# Patient Record
Sex: Female | Born: 1960
Health system: Southern US, Community
[De-identification: ages and names within clinical notes are randomized; demographics above are authoritative.]

## PROBLEM LIST (undated history)

## (undated) DIAGNOSIS — I10 Essential (primary) hypertension: Secondary | ICD-10-CM

## (undated) DIAGNOSIS — F32A Depression, unspecified: Secondary | ICD-10-CM

## (undated) DIAGNOSIS — M1990 Unspecified osteoarthritis, unspecified site: Secondary | ICD-10-CM

## (undated) DIAGNOSIS — N189 Chronic kidney disease, unspecified: Secondary | ICD-10-CM

## (undated) DIAGNOSIS — E079 Disorder of thyroid, unspecified: Secondary | ICD-10-CM

## (undated) DIAGNOSIS — H269 Unspecified cataract: Secondary | ICD-10-CM

## (undated) DIAGNOSIS — R112 Nausea with vomiting, unspecified: Secondary | ICD-10-CM

## (undated) DIAGNOSIS — Z9889 Other specified postprocedural states: Secondary | ICD-10-CM

## (undated) HISTORY — PX: TONSILLECTOMY: SUR1361

## (undated) HISTORY — PX: JOINT REPLACEMENT: SHX530

## (undated) HISTORY — DX: Other specified postprocedural states: Z98.890

## (undated) HISTORY — DX: Disorder of thyroid, unspecified: E07.9

## (undated) HISTORY — PX: CHOLECYSTECTOMY: SHX55

## (undated) HISTORY — PX: APPENDECTOMY: SHX54

## (undated) HISTORY — PX: LITHOTRIPSY: SUR834

## (undated) HISTORY — PX: EYE SURGERY: SHX253

## (undated) HISTORY — PX: ECTOPIC PREGNANCY SURGERY: SHX613

## (undated) HISTORY — DX: Depression, unspecified: F32.A

## (undated) HISTORY — DX: Unspecified cataract: H26.9

## (undated) HISTORY — DX: Essential (primary) hypertension: I10

## (undated) HISTORY — DX: Nausea with vomiting, unspecified: R11.2

## (undated) HISTORY — DX: Unspecified osteoarthritis, unspecified site: M19.90

## (undated) HISTORY — DX: Chronic kidney disease, unspecified: N18.9

---

## 1998-01-20 ENCOUNTER — Inpatient Hospital Stay (HOSPITAL_COMMUNITY): Admission: AD | Admit: 1998-01-20 | Discharge: 1998-01-23 | Payer: Self-pay | Admitting: *Deleted

## 1998-01-25 ENCOUNTER — Encounter (HOSPITAL_COMMUNITY): Admission: RE | Admit: 1998-01-25 | Discharge: 1998-03-06 | Payer: Self-pay | Admitting: *Deleted

## 1998-02-15 ENCOUNTER — Encounter (HOSPITAL_COMMUNITY): Admission: RE | Admit: 1998-02-15 | Discharge: 1998-05-16 | Payer: Self-pay | Admitting: *Deleted

## 1999-05-10 ENCOUNTER — Other Ambulatory Visit: Admission: RE | Admit: 1999-05-10 | Discharge: 1999-05-10 | Payer: Self-pay | Admitting: *Deleted

## 2013-04-16 ENCOUNTER — Encounter: Payer: Self-pay | Admitting: Physician Assistant

## 2013-04-16 NOTE — Telephone Encounter (Signed)
Encounter opened by mistake

## 2013-05-14 ENCOUNTER — Encounter: Payer: Self-pay | Admitting: Internal Medicine

## 2013-05-14 DIAGNOSIS — E079 Disorder of thyroid, unspecified: Secondary | ICD-10-CM | POA: Insufficient documentation

## 2013-05-17 ENCOUNTER — Ambulatory Visit (INDEPENDENT_AMBULATORY_CARE_PROVIDER_SITE_OTHER): Payer: BC Managed Care – PPO | Admitting: Physician Assistant

## 2013-05-17 ENCOUNTER — Encounter: Payer: Self-pay | Admitting: Physician Assistant

## 2013-05-17 VITALS — BP 128/78 | HR 68 | Temp 97.5°F | Resp 16 | Ht 68.0 in | Wt 230.0 lb

## 2013-05-17 DIAGNOSIS — Z79899 Other long term (current) drug therapy: Secondary | ICD-10-CM

## 2013-05-17 DIAGNOSIS — F411 Generalized anxiety disorder: Secondary | ICD-10-CM

## 2013-05-17 DIAGNOSIS — E559 Vitamin D deficiency, unspecified: Secondary | ICD-10-CM

## 2013-05-17 DIAGNOSIS — M25561 Pain in right knee: Secondary | ICD-10-CM

## 2013-05-17 DIAGNOSIS — E079 Disorder of thyroid, unspecified: Secondary | ICD-10-CM

## 2013-05-17 LAB — CBC WITH DIFFERENTIAL/PLATELET
Basophils Relative: 1 % (ref 0–1)
Eosinophils Absolute: 0.2 10*3/uL (ref 0.0–0.7)
Eosinophils Relative: 3 % (ref 0–5)
HCT: 41.7 % (ref 36.0–46.0)
Hemoglobin: 14.3 g/dL (ref 12.0–15.0)
MCH: 31.8 pg (ref 26.0–34.0)
MCHC: 34.3 g/dL (ref 30.0–36.0)
MCV: 92.7 fL (ref 78.0–100.0)
Monocytes Absolute: 0.4 10*3/uL (ref 0.1–1.0)
Monocytes Relative: 7 % (ref 3–12)
Neutrophils Relative %: 68 % (ref 43–77)
RBC: 4.5 MIL/uL (ref 3.87–5.11)

## 2013-05-17 LAB — HEPATIC FUNCTION PANEL
ALT: 15 U/L (ref 0–35)
Albumin: 4.1 g/dL (ref 3.5–5.2)
Alkaline Phosphatase: 80 U/L (ref 39–117)
Indirect Bilirubin: 0.3 mg/dL (ref 0.0–0.9)
Total Protein: 6.3 g/dL (ref 6.0–8.3)

## 2013-05-17 LAB — BASIC METABOLIC PANEL WITH GFR
BUN: 12 mg/dL (ref 6–23)
Calcium: 9.3 mg/dL (ref 8.4–10.5)
Creat: 0.66 mg/dL (ref 0.50–1.10)
GFR, Est African American: >89 mL/min
Glucose, Bld: 91 mg/dL (ref 70–99)

## 2013-05-17 MED ORDER — ESCITALOPRAM OXALATE 20 MG PO TABS: 20.0000 mg | ORAL_TABLET | Freq: Every day | ORAL | Status: DC

## 2013-05-17 NOTE — Progress Notes (Signed)
HPI Patient presents for a one month follow up. Patient had a recent arthroscopy with Dr. Lynnette Caffey Nov 6th and has not been able to exercise, for the past 3 days she states she has had more swelling and warmth in her leg however the pain has improved, she sees her PT tomorrow and Dr. Lynnette Caffey on Friday. She has hypothyroid and is on naturethryoid, we were unable to obtain copies of her last labs and will get labs today. She denies CP, SOB, nausea, diarrhea, constipation.   Past Medical History  Diagnosis Date  . Thyroid disease      No Known Allergies    Current Outpatient Prescriptions on File Prior to Visit  Medication Sig Dispense Refill  . Ascorbic Acid (VITAMIN C PO) Take by mouth daily.      . Cholecalciferol (VITAMIN D PO) Take 5,000 Int'l Units by mouth daily.      . IRON PO Take by mouth daily.      Marland Kitchen MAGNESIUM PO Take 1,200 mg by mouth daily.      . Omega-3 Fatty Acids (FISH OIL PO) Take 2,000 mg by mouth daily.      . phentermine (ADIPEX-P) 37.5 MG tablet Take 37.5 mg by mouth daily before breakfast.      . Probiotic Product (PROBIOTIC PO) Take 10 mg by mouth daily.       No current facility-administered medications on file prior to visit.    ROS: all negative expect above.   Physical: Filed Weights   05/17/13 1049  Weight: 230 lb (104.327 kg)   Filed Vitals:   05/17/13 1049  BP: 128/78  Pulse: 68  Temp: 97.5 F (36.4 C)  Resp: 16   General Appearance: Well nourished, in no apparent distress. Eyes: PERRLA, EOMs. Sinuses: No Frontal/maxillary tenderness ENT/Mouth: Ext aud canals clear, normal light reflex with TMs without erythema, bulging. Post pharynx without erythema, swelling, exudate.  Respiratory: CTAB Cardio: RRR, no murmurs, rubs or gallops. Peripheral pulses brisk and equal bilaterally, 1+ edema. No aortic or femoral bruits. Abdomen: Flat, soft, with bowl sounds. Nontender, no guarding, rebound. Lymphatics: Non tender without lymphadenopathy.   Musculoskeletal: Full ROM all peripheral extremities,  Patient's right knee has healing arthroscopy marks without discharge- + warmth and swelling compared to left knee Skin: Warm, dry without rashes, lesions, ecchymosis.  Neuro: Cranial nerves intact, reflexes equal bilaterally. Normal muscle tone, no cerebellar symptoms. Sensation intact.  Pysch: Awake and oriented X 3, normal affect, Insight and Judgment appropriate.   Assessment and Plan: Right knee- get CBC and send to Dr. Lynnette Caffey, may need an ABX  Hypothyroid-check TSH- we will swtich her form nature thyroid to levothyroxine Depression/axiety- Lexapro 20 mg and start on 1/2 pill daily Patient was given phentermine but has not started it.

## 2013-05-17 NOTE — Patient Instructions (Signed)
Remember exercise is great for your cardiovascular health and can help with weight loss but YOU CAN NOT OUT RUN YOUR FORK!    

## 2013-05-18 LAB — VITAMIN D 25 HYDROXY (VIT D DEFICIENCY, FRACTURES): Vit D, 25-Hydroxy: 62 ng/mL (ref 30–89)

## 2013-05-18 MED ORDER — LEVOTHYROXINE SODIUM 150 MCG PO CAPS: ORAL_CAPSULE | ORAL | Status: DC

## 2013-05-18 NOTE — Addendum Note (Signed)
Addended by: Quentin Mulling R on: 05/18/2013 08:36 AM   Modules accepted: Orders

## 2013-06-10 HISTORY — PX: KNEE ARTHROSCOPY: SUR90

## 2013-06-17 ENCOUNTER — Other Ambulatory Visit: Payer: Self-pay | Admitting: Physician Assistant

## 2013-06-17 ENCOUNTER — Other Ambulatory Visit: Payer: BC Managed Care – PPO

## 2013-06-17 DIAGNOSIS — E079 Disorder of thyroid, unspecified: Secondary | ICD-10-CM

## 2013-06-18 ENCOUNTER — Other Ambulatory Visit: Payer: Self-pay

## 2013-06-18 LAB — TSH: TSH: 0.079 u[IU]/mL — AB (ref 0.350–4.500)

## 2013-11-07 ENCOUNTER — Other Ambulatory Visit: Payer: Self-pay | Admitting: Physician Assistant

## 2014-01-24 ENCOUNTER — Ambulatory Visit: Payer: Self-pay | Admitting: Physician Assistant

## 2014-01-24 ENCOUNTER — Ambulatory Visit (INDEPENDENT_AMBULATORY_CARE_PROVIDER_SITE_OTHER): Payer: BC Managed Care – PPO | Admitting: Physician Assistant

## 2014-01-24 ENCOUNTER — Encounter: Payer: Self-pay | Admitting: Physician Assistant

## 2014-01-24 VITALS — BP 142/98 | HR 80 | Temp 98.2°F | Resp 18 | Ht 68.0 in | Wt 256.0 lb

## 2014-01-24 DIAGNOSIS — Z0000 Encounter for general adult medical examination without abnormal findings: Secondary | ICD-10-CM

## 2014-01-24 DIAGNOSIS — Z8742 Personal history of other diseases of the female genital tract: Secondary | ICD-10-CM

## 2014-01-24 DIAGNOSIS — E559 Vitamin D deficiency, unspecified: Secondary | ICD-10-CM

## 2014-01-24 DIAGNOSIS — Z79899 Other long term (current) drug therapy: Secondary | ICD-10-CM

## 2014-01-24 LAB — CBC WITH DIFFERENTIAL/PLATELET
BASOS PCT: 0 % (ref 0–1)
Basophils Absolute: 0 10*3/uL (ref 0.0–0.1)
EOS ABS: 0.1 10*3/uL (ref 0.0–0.7)
EOS PCT: 2 % (ref 0–5)
HEMATOCRIT: 40.6 % (ref 36.0–46.0)
Hemoglobin: 14.3 g/dL (ref 12.0–15.0)
Lymphocytes Relative: 24 % (ref 12–46)
Lymphs Abs: 1.7 10*3/uL (ref 0.7–4.0)
MCH: 30.6 pg (ref 26.0–34.0)
MCHC: 35.2 g/dL (ref 30.0–36.0)
MCV: 86.9 fL (ref 78.0–100.0)
MONO ABS: 0.6 10*3/uL (ref 0.1–1.0)
Monocytes Relative: 8 % (ref 3–12)
NEUTROS ABS: 4.6 10*3/uL (ref 1.7–7.7)
Neutrophils Relative %: 66 % (ref 43–77)
Platelets: 260 10*3/uL (ref 150–400)
RBC: 4.67 MIL/uL (ref 3.87–5.11)
RDW: 15.1 % (ref 11.5–15.5)
WBC: 7 10*3/uL (ref 4.0–10.5)

## 2014-01-24 MED ORDER — CLOBETASOL PROPIONATE 0.05 % EX CREA: 1.0000 "application " | TOPICAL_CREAM | Freq: Two times a day (BID) | CUTANEOUS | Status: DC

## 2014-01-24 MED ORDER — BUPROPION HCL ER (XL) 150 MG PO TB24: 150.0000 mg | ORAL_TABLET | ORAL | Status: DC

## 2014-01-24 MED ORDER — PHENTERMINE HCL 37.5 MG PO TABS: 37.5000 mg | ORAL_TABLET | Freq: Every day | ORAL | Status: DC

## 2014-01-24 MED ORDER — CLOBETASOL PROPIONATE 0.05 % EX SOLN: 1.0000 "application " | Freq: Two times a day (BID) | CUTANEOUS | Status: DC

## 2014-01-24 NOTE — Patient Instructions (Addendum)
Preventative Care for Adults - Female      MAINTAIN REGULAR HEALTH EXAMS:  A routine yearly physical is a good way to check in with your primary care provider about your health and preventive screening. It is also an opportunity to share updates about your health and any concerns you have, and receive a thorough all-over exam.   Most health insurance companies pay for at least some preventative services.  Check with your health plan for specific coverages.  WHAT PREVENTATIVE SERVICES DO WOMEN NEED?  Adult women should have their weight and blood pressure checked regularly.   Women age 35 and older should have their cholesterol levels checked regularly.  Women should be screened for cervical cancer with a Pap smear and pelvic exam beginning at either age 21, or 3 years after they become sexually activity.    Breast cancer screening generally begins at age 40 with a mammogram and breast exam by your primary care provider.    Beginning at age 50 and continuing to age 75, women should be screened for colorectal cancer.  Certain people may need continued testing until age 85.  Updating vaccinations is part of preventative care.  Vaccinations help protect against diseases such as the flu.  Osteoporosis is a disease in which the bones lose minerals and strength as we age. Women ages 65 and over should discuss this with their caregivers, as should women after menopause who have other risk factors.  Lab tests are generally done as part of preventative care to screen for anemia and blood disorders, to screen for problems with the kidneys and liver, to screen for bladder problems, to check blood sugar, and to check your cholesterol level.  Preventative services generally include counseling about diet, exercise, avoiding tobacco, drugs, excessive alcohol consumption, and sexually transmitted infections.    GENERAL RECOMMENDATIONS FOR GOOD HEALTH:  Healthy diet:  Eat a variety of foods, including  fruit, vegetables, animal or vegetable protein, such as meat, fish, chicken, and eggs, or beans, lentils, tofu, and grains, such as rice.  Drink plenty of water daily.  Decrease saturated fat in the diet, avoid lots of red meat, processed foods, sweets, fast foods, and fried foods.  Exercise:  Aerobic exercise helps maintain good heart health. At least 30-40 minutes of moderate-intensity exercise is recommended. For example, a brisk walk that increases your heart rate and breathing. This should be done on most days of the week.   Find a type of exercise or a variety of exercises that you enjoy so that it becomes a part of your daily life.  Examples are running, walking, swimming, water aerobics, and biking.  For motivation and support, explore group exercise such as aerobic class, spin class, Zumba, Yoga,or  martial arts, etc.    Set exercise goals for yourself, such as a certain weight goal, walk or run in a race such as a 5k walk/run.  Speak to your primary care provider about exercise goals.  Disease prevention:  If you smoke or chew tobacco, find out from your caregiver how to quit. It can literally save your life, no matter how long you have been a tobacco user. If you do not use tobacco, never begin.   Maintain a healthy diet and normal weight. Increased weight leads to problems with blood pressure and diabetes.   The Body Mass Index or BMI is a way of measuring how much of your body is fat. Having a BMI above 27 increases the risk of heart disease,   diabetes, hypertension, stroke and other problems related to obesity. Your caregiver can help determine your BMI and based on it develop an exercise and dietary program to help you achieve or maintain this important measurement at a healthful level.  High blood pressure causes heart and blood vessel problems.  Persistent high blood pressure should be treated with medicine if weight loss and exercise do not work.   Fat and cholesterol leaves  deposits in your arteries that can block them. This causes heart disease and vessel disease elsewhere in your body.  If your cholesterol is found to be high, or if you have heart disease or certain other medical conditions, then you may need to have your cholesterol monitored frequently and be treated with medication.   Ask if you should have a cardiac stress test if your history suggests this. A stress test is a test done on a treadmill that looks for heart disease. This test can find disease prior to there being a problem.  Menopause can be associated with physical symptoms and risks. Hormone replacement therapy is available to decrease these. You should talk to your caregiver about whether starting or continuing to take hormones is right for you.   Osteoporosis is a disease in which the bones lose minerals and strength as we age. This can result in serious bone fractures. Risk of osteoporosis can be identified using a bone density scan. Women ages 65 and over should discuss this with their caregivers, as should women after menopause who have other risk factors. Ask your caregiver whether you should be taking a calcium supplement and Vitamin D, to reduce the rate of osteoporosis.   Avoid drinking alcohol in excess (more than two drinks per day).  Avoid use of street drugs. Do not share needles with anyone. Ask for professional help if you need assistance or instructions on stopping the use of alcohol, cigarettes, and/or drugs.  Brush your teeth twice a day with fluoride toothpaste, and floss once a day. Good oral hygiene prevents tooth decay and gum disease. The problems can be painful, unattractive, and can cause other health problems. Visit your dentist for a routine oral and dental check up and preventive care every 6-12 months.   Look at your skin regularly.  Use a mirror to look at your back. Notify your caregivers of changes in moles, especially if there are changes in shapes, colors, a size  larger than a pencil eraser, an irregular border, or development of new moles.  Safety:  Use seatbelts 100% of the time, whether driving or as a passenger.  Use safety devices such as hearing protection if you work in environments with loud noise or significant background noise.  Use safety glasses when doing any work that could send debris in to the eyes.  Use a helmet if you ride a bike or motorcycle.  Use appropriate safety gear for contact sports.  Talk to your caregiver about gun safety.  Use sunscreen with a SPF (or skin protection factor) of 15 or greater.  Lighter skinned people are at a greater risk of skin cancer. Don't forget to also wear sunglasses in order to protect your eyes from too much damaging sunlight. Damaging sunlight can accelerate cataract formation.   Practice safe sex. Use condoms. Condoms are used for birth control and to help reduce the spread of sexually transmitted infections (or STIs).  Some of the STIs are gonorrhea (the clap), chlamydia, syphilis, trichomonas, herpes, HPV (human papilloma virus) and HIV (human immunodeficiency virus)   which causes AIDS. The herpes, HIV and HPV are viral illnesses that have no cure. These can result in disability, cancer and death.   Keep carbon monoxide and smoke detectors in your home functioning at all times. Change the batteries every 6 months or use a model that plugs into the wall.   Vaccinations:  Stay up to date with your tetanus shots and other required immunizations. You should have a booster for tetanus every 10 years. Be sure to get your flu shot every year, since 5%-20% of the [place] population comes down with the flu. The flu vaccine changes each year, so being vaccinated once is not enough. Get your shot in the fall, before the flu season peaks.   Other vaccines to consider:  Human Papilloma Virus or HPV causes cancer of the cervix, and other infections that can be transmitted from person to person. There is a vaccine for  HPV, and females should get immunized between the ages of 35 and 1. It requires a series of 3 shots.   Pneumococcal vaccine to protect against certain types of pneumonia.  This is normally recommended for adults age 20 or older.  However, adults younger than 51 years old with certain underlying conditions such as diabetes, heart or lung disease should also receive the vaccine.  Shingles vaccine to protect against Varicella Zoster if you are older than age 32, or younger than 51 years old with certain underlying illness.  Hepatitis A vaccine to protect against a form of infection of the liver by a virus acquired from food.  Hepatitis B vaccine to protect against a form of infection of the liver by a virus acquired from blood or body fluids, particularly if you work in health care.  If you plan to travel internationally, check with your local health department for specific vaccination recommendations.  Cancer Screening:  Breast cancer screening is essential to preventive care for women. All women age 85 and older should perform a breast self-exam every month. At age 90 and older, women should have their caregiver complete a breast exam each year. Women at ages 82 and older should have a mammogram (x-ray film) of the breasts. Your caregiver can discuss how often you need mammograms.    Cervical cancer screening includes taking a Pap smear (sample of cells examined under a microscope) from the cervix (end of the uterus). It also includes testing for HPV (Human Papilloma Virus, which can cause cervical cancer). Screening and a pelvic exam should begin at age 58, or 3 years after a woman becomes sexually active. Screening should occur every year, with a Pap smear but no HPV testing, up to age 12. After age 53, you should have a Pap smear every 3 years with HPV testing, if no HPV was found previously.   Most routine colon cancer screening begins at the age of 83. On a yearly basis, doctors may provide  special easy to use take-home tests to check for hidden blood in the stool. Sigmoidoscopy or colonoscopy can detect the earliest forms of colon cancer and is life saving. These tests use a small camera at the end of a tube to directly examine the colon. Speak to your caregiver about this at age 68, when routine screening begins (and is repeated every 5 years unless early forms of pre-cancerous polyps or small growths are found).      Morton's Neuroma in Sports  (Interdigital Plantar Neuroma) Morton's neuroma is a condition of the nervous system that results in  pain or loss of feeling in the toes. The disease is caused by the bones of the foot squeezing the nerve that runs between two toes (interdigital nerve). The third and fourth toes are most likely to be affected by this disease. SYMPTOMS   Tingling, numbness, burning, or electric shocks in the front of the foot, often involving the third and fourth toes, although it may involve any other pair of toes.  Pain and tenderness in the front of the foot, that gets worse when walking.  Pain that gets worse when pressure is applied to the foot (wearing shoes).  Severe pain in the front of the foot, when standing on the front of the foot (on tiptoes), such as with running, jumping, pivoting, or dancing. CAUSES  Morton's neuroma is caused by swelling of the nerve between two toes. This swelling causes the nerve to be pinched between the bones of the foot. RISK INCREASES WITH:  Recurring foot or ankle injuries.  Poor fitting or worn shoes, with minimal padding and shock absorbers.  Loose ligaments of the foot, causing thickening of the nerve.  Poor foot strength and flexibility. PREVENTION  Warm up and stretch properly before activity.  Maintain physical fitness:  Foot and ankle flexibility.  Muscle strength and endurance.  Cardiovascular fitness.  Wear properly fitted and padded shoes.  Wear arch supports (orthotics), when  needed. PROGNOSIS  If treated properly, Morton's neuroma can usually be cured with non-surgical treatment. For certain cases, surgery may be needed. RELATED COMPLICATIONS  Permanent numbness and pain in the foot.  Inability to participate in athletics, because of pain. TREATMENT Treatment first involves stopping any activities that make the symptoms worse. The use of ice and medicine will help reduce pain and inflammation. Wearing shoes with a wide toe box, and an orthotic arch support or metatarsal bar, may also reduce pain. Your caregiver may give you a corticosteroid injection, to further reduce inflammation. If non-surgical treatment is unsuccessful, surgery may be needed. Surgery to fix Morton's neuroma is often performed as an outpatient procedure, meaning you can go home the same day as the surgery. The procedure involves removing the source of pressure on the nerve. If it is necessary to remove the nerve, you can expect persistent numbness. MEDICATION  If pain medicine is needed, nonsteroidal anti-inflammatory medicines (aspirin and ibuprofen), or other minor pain relievers (acetaminophen), are often advised.  Do not take pain medicine for 7 days before surgery.  Prescription pain relievers are usually prescribed only after surgery. Use only as directed and only as much as you need.  Corticosteroid injections are used in extreme cases, to reduce inflammation. These injections should be done only if necessary, because they may be given only a limited number of times. HEAT AND COLD  Cold treatment (icing) should be applied for 10 to 15 minutes every 2 to 3 hours for inflammation and pain, and immediately after activity that aggravates your symptoms. Use ice packs or an ice massage.  Heat treatment may be used before performing stretching and strengthening activities prescribed by your caregiver, physical therapist, or athletic trainer. Use a heat pack or a warm water soak. SEEK MEDICAL  CARE IF:   Symptoms get worse or do not improve in 2 weeks, despite treatment.  After surgery you develop increasing pain, swelling, redness, increased warmth, bleeding, drainage of fluids, or fever.  New, unexplained symptoms develop. (Drugs used in treatment may produce side effects.) Document Released: 04/03/2005 Document Revised: 08/19/2011 Document Reviewed: 09/08/2008 Exit[employer] Patient  Information 2015 [place], [place]. This information is not intended to replace advice given to you by your health care provider. Make sure you discuss any questions you have with your health care provider.

## 2014-01-24 NOTE — Progress Notes (Signed)
Complete Physical  Assessment and Plan: Hypothyroid- check level, continue meds Labile HTN- monitor very closely while on the phentermine Anxiety- states lexapro causes some problems, will switch to wellbutrin 150XL Knee pain- continue chiropractor, suggest replacement Obesity- phentermine 37.5 #30 2 RF FSH  Discussed med's effects and SE's. Screening labs and tests as requested with regular follow-up as recommended.  HPI 53 y.o. female  presents for a complete physical.  Her blood pressure has been controlled at home, it is often 130/80's at home and she has white coat syndrome, today their BP is BP: 142/98 mmHg She does not workout due to pain right now and depression but she would like to get back into. She denies chest pain, shortness of breath, dizziness.  She is not on cholesterol medication and denies myalgias. Her cholesterol is at goal. The cholesterol last visit was:  LDL 93 Last A1C in the office was:  Patient is on Vitamin D supplement.   Lab Results  Component Value Date   VD25OH 62 05/17/2013  Had right knee arthroscopy in Nov, most recent Xray showed bone on bone right knee. It is painful to walk her dog even.  She has gained 30 lbs due to inactivity from her knee and from stress with  Her dad being diagnosed with stage 4 pancreatis cancer, he is on hospice. Taking care of him and her mom.  She is on thyroid medication. Her medication was changed last visit. Patient denies heat / cold intolerance, nervousness and palpitations.  Lab Results  Component Value Date   TSH 0.079* 06/17/2013  .    Current Medications:  Current Outpatient Prescriptions on File Prior to Visit  Medication Sig Dispense Refill  . Cholecalciferol (VITAMIN D PO) Take 5,000 Int'l Units by mouth daily.      . IRON PO Take by mouth daily.      Marland Kitchen levothyroxine (SYNTHROID, LEVOTHROID) 150 MCG tablet TAKE ONE TABLET BY MOUTH ONCE DAILY OR AS DIRECTED  90 tablet  0  . naproxen (NAPROSYN) 500 MG tablet  Take 500 mg by mouth 2 (two) times daily with a meal.      . Omega-3 Fatty Acids (FISH OIL PO) Take 2,000 mg by mouth daily.      . phentermine (ADIPEX-P) 37.5 MG tablet Take 37.5 mg by mouth daily before breakfast.       No current facility-administered medications on file prior to visit.   Health Maintenance:   Immunization History  Administered Date(s) Administered  . Td 06/11/2007   Tetanus: 2009 Pneumovax: Flu vaccine: 2014 Zostavax: Pap: June 2014 normal PAP due 2017 [name] December 29 2013 q 31 MGM: June 2014  DUe DEXA: n/A Colonoscopy: [facility] in [place]- 2010 due 2015 EGD:  Allergies: No Known Allergies Medical History:  Past Medical History  Diagnosis Date  . Thyroid disease    Surgical History: No past surgical history on file. Family History:  Family History  Problem Relation Age of Onset  . Hypertension Mother   . Alzheimer's disease Mother 88  . Cancer Father   . Hypertension Father   . Arthritis Father   . Gout Father   . Depression Brother   . Mental illness Daughter   . Alzheimer's disease Maternal Grandfather 47   Social History:  History  Substance Use Topics  . Smoking status: Never Smoker   . Smokeless tobacco: Never Used  . Alcohol Use: No    Review of Systems: [X]  = complains of  [ ]  = denies  General: Fatigue [ ]  Fever [ ]  Chills [ ]  Weakness [ ]   Insomnia [ ] Weight change [ ]  Night sweats [ ]   Change in appetite [ ]  Eyes: Redness [ ]  Blurred vision [ ]  Diplopia [ ]  Discharge [ ]   ENT: Congestion [ ]  Sinus Pain [ ]  Post Nasal Drip [ ]  Sore Throat [ ]  Earache [ ]  hearing loss [ ]  Tinnitus [ ]  Snoring [ ]   Cardiac: Chest pain/pressure [ ]  SOB [ ]  Orthopnea [ ]   Palpitations [ ]   Paroxysmal nocturnal dyspnea[ ]  Claudication [ ]  Edema [ ]   Pulmonary: Cough [ ]  Wheezing[ ]   SOB [ ]   Pleurisy [ ]   GI: Nausea [ ]  Vomiting[ ]  Dysphagia[ ]  Heartburn[ ]  Abdominal pain [ ]  Constipation [ ] ; Diarrhea [ ]  BRBPR [ ]  Melena[ ]  Bloating [ ]  Hemorrhoids [ ]    GU: Hematuria[ ]  Dysuria [ ]  Nocturia[ ]  Urgency [ ]   Hesitancy [ ]  Discharge [ ]  Frequency [ ]   Breast:  Breast lumps [ ]   nipple discharge [ ]    Neuro: Headaches[ ]  Vertigo[ ]  Paresthesias[ ]  Spasm [ ]  Speech changes [ ]  Incoordination [ ]   Ortho: Arthritis [x ] Joint pain [x ] Muscle pain [ ]  Joint swelling [ ]  Back Pain [ ]  Skin:  Rash [ ]   Pruritis [ ]  Change in skin lesion [ ]   Psych: Depression[ ]  Anxiety[ ]  Confusion [ ]  Memory loss [ ]   Heme/Lypmh: Bleeding [ ]  Bruising [ ]  Enlarged lymph nodes [ ]   Endocrine: Visual blurring [ ]  Paresthesia [ ]  Polyuria [ ]  Polydypsea [ ]    Heat/cold intolerance [ ]  Hypoglycemia [ ]   Physical Exam: Estimated body mass index is 38.93 kg/(m^2) as calculated from the following:   Height as of this encounter: 5\' 8"  (1.727 m).   Weight as of this encounter: 256 lb (116.121 kg). BP 142/98  Pulse 80  Temp(Src) 98.2 F (36.8 C) (Temporal)  Resp 18  Ht 5\' 8"  (1.727 m)  Wt 256 lb (116.121 kg)  BMI 38.93 kg/m2  [name] 12/29/2013 General Appearance: Well nourished, in no apparent distress. Eyes: PERRLA, EOMs, conjunctiva no swelling or erythema, normal fundi and vessels. Sinuses: No Frontal/maxillary tenderness ENT/Mouth: Ext aud canals clear, normal light reflex with TMs without erythema, bulging.  Good dentition. No erythema, swelling, or exudate on post pharynx. Tonsils not swollen or erythematous. Hearing normal.  Neck: Supple, thyroid normal. No bruits Respiratory: Respiratory effort normal, BS equal bilaterally without rales, rhonchi, wheezing or stridor. Cardio: RRR without murmurs, rubs or gallops. Brisk peripheral pulses without edema.  Chest: symmetric, with normal excursions and percussion. Breasts: Symmetric, without lumps, nipple discharge, retractions. Abdomen: Soft, +BS. Non tender, no guarding, rebound, hernias, masses, or organomegaly. .  Lymphatics: Non tender without lymphadenopathy.  Genitourinary: defer Musculoskeletal: Full ROM  all peripheral extremities,5/5 strength, and antalgic gait Skin: Warm, dry without rashes, lesions, ecchymosis.  Neuro: Cranial nerves intact, reflexes equal bilaterally. Normal muscle tone, no cerebellar symptoms. Sensation intact.  Psych: Awake and oriented X 3, normal affect, Insight and Judgment appropriate.   EKG: WNL no changes. AORTA SCAN: WNL    Vicie Mutters 3:19 PM

## 2014-01-25 LAB — FOLLICLE STIMULATING HORMONE: FSH: 7.7 m[IU]/mL

## 2014-01-25 LAB — FERRITIN: Ferritin: 19 ng/mL (ref 10–291)

## 2014-01-25 LAB — VITAMIN B12: Vitamin B-12: 406 pg/mL (ref 211–911)

## 2014-01-25 LAB — HEPATIC FUNCTION PANEL
ALBUMIN: 4.4 g/dL (ref 3.5–5.2)
ALT: 13 U/L (ref 0–35)
AST: 15 U/L (ref 0–37)
Alkaline Phosphatase: 79 U/L (ref 39–117)
Bilirubin, Direct: 0.1 mg/dL (ref 0.0–0.3)
Indirect Bilirubin: 0.5 mg/dL (ref 0.2–1.2)
TOTAL PROTEIN: 6.6 g/dL (ref 6.0–8.3)
Total Bilirubin: 0.6 mg/dL (ref 0.2–1.2)

## 2014-01-25 LAB — LIPID PANEL
Cholesterol: 188 mg/dL (ref 0–200)
HDL: 46 mg/dL (ref >39)
LDL Cholesterol: 123 mg/dL — ABNORMAL HIGH (ref 0–99)
TRIGLYCERIDES: 94 mg/dL (ref <150)
Total CHOL/HDL Ratio: 4.1 Ratio
VLDL: 19 mg/dL (ref 0–40)

## 2014-01-25 LAB — URINALYSIS, ROUTINE W REFLEX MICROSCOPIC
Bilirubin Urine: NEGATIVE
Glucose, UA: NEGATIVE mg/dL
Hgb urine dipstick: NEGATIVE
Ketones, ur: 15 mg/dL — AB
Leukocytes, UA: NEGATIVE
NITRITE: NEGATIVE
PROTEIN: NEGATIVE mg/dL
UROBILINOGEN UA: 0.2 mg/dL (ref 0.0–1.0)
pH: 6 (ref 5.0–8.0)

## 2014-01-25 LAB — MICROALBUMIN / CREATININE URINE RATIO
Creatinine, Urine: 76.4 mg/dL
MICROALB UR: 0.5 mg/dL (ref 0.00–1.89)
Microalb Creat Ratio: 6.5 mg/g (ref 0.0–30.0)

## 2014-01-25 LAB — MAGNESIUM: Magnesium: 2 mg/dL (ref 1.5–2.5)

## 2014-01-25 LAB — INSULIN, FASTING: INSULIN FASTING, SERUM: 11 u[IU]/mL (ref 3–28)

## 2014-01-25 LAB — BASIC METABOLIC PANEL WITH GFR
BUN: 10 mg/dL (ref 6–23)
CO2: 25 mEq/L (ref 19–32)
CREATININE: 0.68 mg/dL (ref 0.50–1.10)
Calcium: 9.3 mg/dL (ref 8.4–10.5)
Chloride: 101 mEq/L (ref 96–112)
GFR, Est Non African American: >89 mL/min
Glucose, Bld: 82 mg/dL (ref 70–99)
POTASSIUM: 3.8 meq/L (ref 3.5–5.3)
Sodium: 136 mEq/L (ref 135–145)

## 2014-01-25 LAB — IRON AND TIBC
%SAT: 24 % (ref 20–55)
Iron: 76 ug/dL (ref 42–145)
TIBC: 320 ug/dL (ref 250–470)
UIBC: 244 ug/dL (ref 125–400)

## 2014-01-25 LAB — VITAMIN D 25 HYDROXY (VIT D DEFICIENCY, FRACTURES): Vit D, 25-Hydroxy: 53 ng/mL (ref 30–89)

## 2014-01-25 LAB — HEMOGLOBIN A1C
Hgb A1c MFr Bld: 5.5 % (ref <5.7)
MEAN PLASMA GLUCOSE: 111 mg/dL (ref <117)

## 2014-01-25 LAB — TSH: TSH: 0.396 u[IU]/mL (ref 0.350–4.500)

## 2014-03-14 ENCOUNTER — Other Ambulatory Visit: Payer: Self-pay | Admitting: Physician Assistant

## 2014-04-21 ENCOUNTER — Other Ambulatory Visit: Payer: Self-pay | Admitting: Physician Assistant

## 2014-05-10 ENCOUNTER — Ambulatory Visit (INDEPENDENT_AMBULATORY_CARE_PROVIDER_SITE_OTHER): Payer: BC Managed Care – PPO | Admitting: Physician Assistant

## 2014-05-10 ENCOUNTER — Encounter: Payer: Self-pay | Admitting: Physician Assistant

## 2014-05-10 VITALS — BP 140/88 | HR 80 | Temp 98.1°F | Resp 16 | Ht 68.0 in | Wt 258.0 lb

## 2014-05-10 DIAGNOSIS — E559 Vitamin D deficiency, unspecified: Secondary | ICD-10-CM

## 2014-05-10 DIAGNOSIS — I10 Essential (primary) hypertension: Secondary | ICD-10-CM

## 2014-05-10 DIAGNOSIS — E079 Disorder of thyroid, unspecified: Secondary | ICD-10-CM

## 2014-05-10 DIAGNOSIS — Z23 Encounter for immunization: Secondary | ICD-10-CM

## 2014-05-10 DIAGNOSIS — E669 Obesity, unspecified: Secondary | ICD-10-CM

## 2014-05-10 DIAGNOSIS — Z79899 Other long term (current) drug therapy: Secondary | ICD-10-CM

## 2014-05-10 LAB — CBC WITH DIFFERENTIAL/PLATELET
BASOS ABS: 0 10*3/uL (ref 0.0–0.1)
Basophils Relative: 0 % (ref 0–1)
EOS ABS: 0.2 10*3/uL (ref 0.0–0.7)
EOS PCT: 2 % (ref 0–5)
HCT: 39.4 % (ref 36.0–46.0)
Hemoglobin: 13.8 g/dL (ref 12.0–15.0)
LYMPHS ABS: 1.5 10*3/uL (ref 0.7–4.0)
Lymphocytes Relative: 19 % (ref 12–46)
MCH: 29.7 pg (ref 26.0–34.0)
MCHC: 35 g/dL (ref 30.0–36.0)
MCV: 84.9 fL (ref 78.0–100.0)
MPV: 10 fL (ref 9.4–12.4)
Monocytes Absolute: 0.6 10*3/uL (ref 0.1–1.0)
Monocytes Relative: 8 % (ref 3–12)
Neutro Abs: 5.5 10*3/uL (ref 1.7–7.7)
Neutrophils Relative %: 71 % (ref 43–77)
PLATELETS: 261 10*3/uL (ref 150–400)
RBC: 4.64 MIL/uL (ref 3.87–5.11)
RDW: 14.4 % (ref 11.5–15.5)
WBC: 7.8 10*3/uL (ref 4.0–10.5)

## 2014-05-10 LAB — HEPATIC FUNCTION PANEL
ALK PHOS: 93 U/L (ref 39–117)
ALT: 16 U/L (ref 0–35)
AST: 17 U/L (ref 0–37)
Albumin: 4.1 g/dL (ref 3.5–5.2)
BILIRUBIN DIRECT: 0.1 mg/dL (ref 0.0–0.3)
BILIRUBIN INDIRECT: 0.3 mg/dL (ref 0.2–1.2)
BILIRUBIN TOTAL: 0.4 mg/dL (ref 0.2–1.2)
Total Protein: 6.7 g/dL (ref 6.0–8.3)

## 2014-05-10 LAB — BASIC METABOLIC PANEL WITH GFR
BUN: 12 mg/dL (ref 6–23)
CHLORIDE: 106 meq/L (ref 96–112)
CO2: 25 meq/L (ref 19–32)
CREATININE: 0.73 mg/dL (ref 0.50–1.10)
Calcium: 9.2 mg/dL (ref 8.4–10.5)
GFR, Est African American: >89 mL/min
GFR, Est Non African American: >89 mL/min
Glucose, Bld: 82 mg/dL (ref 70–99)
Potassium: 4.5 mEq/L (ref 3.5–5.3)
Sodium: 138 mEq/L (ref 135–145)

## 2014-05-10 LAB — MAGNESIUM: Magnesium: 2.1 mg/dL (ref 1.5–2.5)

## 2014-05-10 LAB — TSH: TSH: 1.557 u[IU]/mL (ref 0.350–4.500)

## 2014-05-10 MED ORDER — BUPROPION HCL ER (XL) 150 MG PO TB24: ORAL_TABLET | ORAL | Status: DC

## 2014-05-10 MED ORDER — HYDROCHLOROTHIAZIDE 25 MG PO TABS: 25.0000 mg | ORAL_TABLET | Freq: Every day | ORAL | Status: DC

## 2014-05-10 NOTE — Patient Instructions (Addendum)
Our two goals are increase water 64 oz and try to have 2-3 suppers planned a week.   We want weight loss that will last so you should lose 1-2 pounds a week.  THAT IS IT! Please pick THREE things a month to change. Once it is a habit check off the item. Then pick another three items off the list to become habits.  If you are already doing a habit on the list GREAT!  Cross that item off! o Don't drink your calories. Ie, alcohol, soda, fruit juice, and sweet tea.  o Drink more water. Drink a glass when you feel hungry or before each meal.  o Eat breakfast - Complex carb and protein (likeDannon light and fit yogurt, oatmeal, fruit, eggs, [place] bacon). o Measure your cereal.  Eat no more than one cup a day. (ie [place]) o Eat an apple a day. o Add a vegetable a day. o Try a new vegetable a month. o Use Pam! Stop using oil or butter to cook. o Don't finish your plate or use smaller plates. o Share your dessert. o Eat sugar free Jello for dessert or frozen grapes. o Don't eat 2-3 hours before bed. o Switch to whole wheat bread, pasta, and brown rice. o Make healthier choices when you eat out. No fries! o Pick baked chicken, NOT fried. o Don't forget to SLOW DOWN when you eat. It is not going anywhere.  o Take the stairs. o Park far away in the parking lot o [employer] (or weights) for 10 minutes while watching TV. o Walk at work for 10 minutes during break. o Walk outside 1 time a week with your friend, kids, dog, or significant other. o Start a walking group at [place] the mall as much as you can tolerate.  o Keep a food diary. o Weigh yourself daily. o Walk for 15 minutes 3 days per week. o Cook at home more often and eat out less.  If life happens and you go back to old habits, it is okay.  Just start over. You can do it!   If you experience chest pain, get short of breath, or tired during the exercise, please stop immediately and inform your doctor.

## 2014-05-10 NOTE — Progress Notes (Signed)
Assessment and Plan:  Hypertension: Continue medication, monitor blood pressure at home. Continue DASH diet.  Reminder to go to the ER if any CP, SOB, nausea, dizziness, severe HA, changes vision/speech, left arm numbness and tingling, and jaw pain. Cholesterol: Continue diet and exercise. Check cholesterol.  Obesity with co morbidities- long discussion about weight loss, diet, and exercise Hypothyroidism-check TSH level, continue medications the same, reminded to take on an empty stomach 30-42mins before food.  Vitamin D Def- check level and continue medications.   Continue diet and meds as discussed. Further disposition pending results of labs.  HPI 53 y.o. female  presents for 3 month follow up with hypertension, hyperlipidemia, prediabetes and vitamin D. Her blood pressure has not been controlled at home, today their BP is BP: 140/88 mmHg  Her BP has been elevated at home, she did take some her her dads lasix due to swelling/BP elevating which helped, lasix 20mg  for 3-5 days. She stopped the phentermine due to her elevated BP.  She does workout, she has been having some continuing knee pain but she is doing better. She denies chest pain, shortness of breath, dizziness.  She has been under a lot of stress, her father passed recently and she has been having to take care of her on and his affairs.  She is not on cholesterol medication and denies myalgias. Her cholesterol is at goal. The cholesterol last visit was:   Lab Results  Component Value Date   CHOL 188 01/24/2014   HDL 46 01/24/2014   LDLCALC 123* 01/24/2014   TRIG 94 01/24/2014   CHOLHDL 4.1 01/24/2014    Last A1C in the office was:  Lab Results  Component Value Date   HGBA1C 5.5 01/24/2014   Patient is on Vitamin D supplement.   Lab Results  Component Value Date   VD25OH 53 01/24/2014     She is on thyroid medication. Her medication was not changed last visit. Patient denies fatigue, weight changes, heat/cold intolerance,  bowel/skin changes or CVS symptoms  Lab Results  Component Value Date   TSH 0.396 01/24/2014  .  BMI is Body mass index is 39.24 kg/(m^2)., she is working on diet and exercise and has done well. She eats out a lot for lunch/dinner or sometimes dinner is cereal.  Wt Readings from Last 3 Encounters:  05/10/14 258 lb (117.028 kg)  01/24/14 256 lb (116.121 kg)  05/17/13 230 lb (104.327 kg)    Current Medications:  Current Outpatient Prescriptions on File Prior to Visit  Medication Sig Dispense Refill  . buPROPion (WELLBUTRIN XL) 150 MG 24 hr tablet TAKE ONE TABLET BY MOUTH ONCE DAILY IN THE MORNING 30 tablet 0  . Cholecalciferol (VITAMIN D PO) Take 5,000 Int'l Units by mouth daily.    . clobetasol (TEMOVATE) 0.05 % external solution Apply 1 application topically 2 (two) times daily. 50 mL 1  . clobetasol cream (TEMOVATE) 0.03 % Apply 1 application topically 2 (two) times daily. 45 g 2  . Glucosamine-Chondroit-Vit C-Mn (GLUCOSAMINE CHONDR 1500 COMPLX PO) Take by mouth daily.    . IRON PO Take by mouth daily.    Marland Kitchen levothyroxine (SYNTHROID, LEVOTHROID) 150 MCG tablet TAKE ONE TABLET BY MOUTH ONCE DAILY OR AS DIRECTED 90 tablet 0  . meloxicam (MOBIC) 7.5 MG tablet Take 7.5 mg by mouth daily.    . naproxen (NAPROSYN) 500 MG tablet Take 500 mg by mouth 2 (two) times daily with a meal.    . Omega-3 Fatty Acids (FISH  OIL PO) Take 2,000 mg by mouth daily.    Marland Kitchen OVER THE COUNTER MEDICATION 400 mg daily. Bio Cocumin    . phentermine (ADIPEX-P) 37.5 MG tablet Take 1 tablet (37.5 mg total) by mouth daily before breakfast. 30 tablet 2  . traMADol (ULTRAM) 50 MG tablet Take 50 mg by mouth every 6 (six) hours as needed.     No current facility-administered medications on file prior to visit.   Medical History:  Past Medical History  Diagnosis Date  . Thyroid disease    Allergies: No Known Allergies   Review of Systems: [X]  = complains of  [ ]  = denies  General: Fatigue [ ]  Fever [ ]  Chills [ ]   Weakness [ ]   Insomnia [ ]  Eyes: Redness [ ]  Blurred vision [ ]  Diplopia [ ]   ENT: Congestion [ ]  Sinus Pain [ ]  Post Nasal Drip [ ]  Sore Throat [ ]  Earache [ ]   Cardiac: Chest pain/pressure [ ]  SOB [ ]  Orthopnea [ ]   Palpitations [ ]   Paroxysmal nocturnal dyspnea[ ]  Claudication [ ]  Edema [ ]   Pulmonary: Cough [ ]  Wheezing[ ]   SOB [ ]   Snoring [ ]   GI: Nausea [ ]  Vomiting[ ]  Dysphagia[ ]  Heartburn[ ]  Abdominal pain [ ]  Constipation [ ] ; Diarrhea [ ] ; BRBPR [ ]  Melena[ ]  GU: Hematuria[ ]  Dysuria [ ]  Nocturia[ ]  Urgency [ ]   Hesitancy [ ]  Discharge [ ]  Neuro: Headaches[ ]  Vertigo[ ]  Paresthesias[ ]  Spasm [ ]  Speech changes [ ]  Incoordination [ ]   Ortho: Arthritis [ ]  Joint pain [ ]  Muscle pain [ ]  Joint swelling [ ]  Back Pain [ ]  Skin:  Rash [ ]   Pruritis [ ]  Change in skin lesion [ ]   Psych: Depression[ ]  Anxiety[ ]  Confusion [ ]  Memory loss [ ]   Heme/Lypmh: Bleeding [ ]  Bruising [ ]  Enlarged lymph nodes [ ]   Endocrine: Visual blurring [ ]  Paresthesia [ ]  Polyuria [ ]  Polydypsea [ ]    Heat/cold intolerance [ ]  Hypoglycemia [ ]   Family history- Review and unchanged Social history- Review and unchanged Physical Exam: BP 140/88 mmHg  Pulse 80  Temp(Src) 98.1 F (36.7 C)  Resp 16  Ht 5\' 8"  (1.727 m)  Wt 258 lb (117.028 kg)  BMI 39.24 kg/m2 Wt Readings from Last 3 Encounters:  05/10/14 258 lb (117.028 kg)  01/24/14 256 lb (116.121 kg)  05/17/13 230 lb (104.327 kg)   General Appearance: Well nourished, in no apparent distress. Eyes: PERRLA, EOMs, conjunctiva no swelling or erythema Sinuses: No Frontal/maxillary tenderness ENT/Mouth: Ext aud canals clear, TMs without erythema, bulging. No erythema, swelling, or exudate on post pharynx.  Tonsils not swollen or erythematous. Hearing normal.  Neck: Supple, thyroid normal.  Respiratory: Respiratory effort normal, BS equal bilaterally without rales, rhonchi, wheezing or stridor.  Cardio: RRR with no MRGs. Brisk peripheral pulses without  edema.  Abdomen: Soft, + BS.  Non tender, no guarding, rebound, hernias, masses. Lymphatics: Non tender without lymphadenopathy.  Musculoskeletal: Full ROM, 5/5 strength, normal gait.  Skin: Warm, dry without rashes, lesions, ecchymosis.  Neuro: Cranial nerves intact. Normal muscle tone, no cerebellar symptoms. Sensation intact.  Psych: Awake and oriented X 3, normal affect, Insight and Judgment appropriate.    Vicie Mutters, PA-C 11:21 AM [facility]

## 2014-05-11 LAB — VITAMIN D 25 HYDROXY (VIT D DEFICIENCY, FRACTURES): Vit D, 25-Hydroxy: 36 ng/mL (ref 30–100)

## 2014-05-26 ENCOUNTER — Encounter: Payer: Self-pay | Admitting: Internal Medicine

## 2014-07-13 ENCOUNTER — Other Ambulatory Visit: Payer: Self-pay | Admitting: Internal Medicine

## 2014-09-05 ENCOUNTER — Other Ambulatory Visit: Payer: Self-pay | Admitting: Physician Assistant

## 2014-11-11 ENCOUNTER — Other Ambulatory Visit: Payer: Self-pay | Admitting: Internal Medicine

## 2014-12-27 ENCOUNTER — Other Ambulatory Visit: Payer: Self-pay | Admitting: Physician Assistant

## 2015-01-21 ENCOUNTER — Other Ambulatory Visit: Payer: Self-pay | Admitting: Internal Medicine

## 2015-01-26 ENCOUNTER — Encounter: Payer: Self-pay | Admitting: Physician Assistant

## 2015-02-03 ENCOUNTER — Other Ambulatory Visit: Payer: Self-pay | Admitting: Internal Medicine

## 2015-02-03 ENCOUNTER — Other Ambulatory Visit: Payer: Self-pay | Admitting: *Deleted

## 2015-02-03 MED ORDER — HYDROCHLOROTHIAZIDE 25 MG PO TABS
25.0000 mg | ORAL_TABLET | Freq: Every day | ORAL | Status: DC
Start: 2015-02-03 — End: 2015-02-21

## 2015-02-05 ENCOUNTER — Other Ambulatory Visit: Payer: Self-pay | Admitting: Physician Assistant

## 2015-02-21 ENCOUNTER — Ambulatory Visit (INDEPENDENT_AMBULATORY_CARE_PROVIDER_SITE_OTHER): Payer: PRIVATE HEALTH INSURANCE | Admitting: Physician Assistant

## 2015-02-21 ENCOUNTER — Encounter: Payer: Self-pay | Admitting: Physician Assistant

## 2015-02-21 VITALS — BP 138/80 | HR 80 | Temp 97.9°F | Resp 16 | Ht 68.0 in | Wt 277.4 lb

## 2015-02-21 DIAGNOSIS — Z0000 Encounter for general adult medical examination without abnormal findings: Secondary | ICD-10-CM | POA: Diagnosis not present

## 2015-02-21 DIAGNOSIS — E669 Obesity, unspecified: Secondary | ICD-10-CM

## 2015-02-21 DIAGNOSIS — E079 Disorder of thyroid, unspecified: Secondary | ICD-10-CM

## 2015-02-21 DIAGNOSIS — D649 Anemia, unspecified: Secondary | ICD-10-CM

## 2015-02-21 DIAGNOSIS — Z79899 Other long term (current) drug therapy: Secondary | ICD-10-CM

## 2015-02-21 DIAGNOSIS — I10 Essential (primary) hypertension: Secondary | ICD-10-CM

## 2015-02-21 DIAGNOSIS — Z131 Encounter for screening for diabetes mellitus: Secondary | ICD-10-CM

## 2015-02-21 DIAGNOSIS — E559 Vitamin D deficiency, unspecified: Secondary | ICD-10-CM

## 2015-02-21 LAB — CBC WITH DIFFERENTIAL/PLATELET
BASOS ABS: 0 10*3/uL (ref 0.0–0.1)
BASOS PCT: 0 % (ref 0–1)
Eosinophils Absolute: 0.2 10*3/uL (ref 0.0–0.7)
Eosinophils Relative: 2 % (ref 0–5)
HCT: 38.6 % (ref 36.0–46.0)
HEMOGLOBIN: 12.9 g/dL (ref 12.0–15.0)
Lymphocytes Relative: 21 % (ref 12–46)
Lymphs Abs: 2.2 10*3/uL (ref 0.7–4.0)
MCH: 28.3 pg (ref 26.0–34.0)
MCHC: 33.4 g/dL (ref 30.0–36.0)
MCV: 84.6 fL (ref 78.0–100.0)
MPV: 10.3 fL (ref 8.6–12.4)
Monocytes Absolute: 0.6 10*3/uL (ref 0.1–1.0)
Monocytes Relative: 6 % (ref 3–12)
NEUTROS ABS: 7.5 10*3/uL (ref 1.7–7.7)
NEUTROS PCT: 71 % (ref 43–77)
Platelets: 261 10*3/uL (ref 150–400)
RBC: 4.56 MIL/uL (ref 3.87–5.11)
RDW: 16 % — ABNORMAL HIGH (ref 11.5–15.5)
WBC: 10.6 10*3/uL — AB (ref 4.0–10.5)

## 2015-02-21 LAB — HEMOGLOBIN A1C
Hgb A1c MFr Bld: 5.8 % — ABNORMAL HIGH (ref <5.7)
MEAN PLASMA GLUCOSE: 120 mg/dL — AB (ref <117)

## 2015-02-21 MED ORDER — LEVOTHYROXINE SODIUM 150 MCG PO TABS: 150.0000 ug | ORAL_TABLET | Freq: Every day | ORAL | Status: DC

## 2015-02-21 MED ORDER — FLUOCINOLONE ACETONIDE 0.01 % EX SHAM: MEDICATED_SHAMPOO | CUTANEOUS | Status: DC

## 2015-02-21 MED ORDER — HYDROCHLOROTHIAZIDE 25 MG PO TABS: 25.0000 mg | ORAL_TABLET | Freq: Every day | ORAL | Status: DC

## 2015-02-21 MED ORDER — NALTREXONE-BUPROPION HCL ER 8-90 MG PO TB12: ORAL_TABLET | ORAL | Status: DC

## 2015-02-21 NOTE — Progress Notes (Signed)
Complete Physical  Assessment and Plan: 1. Thyroid disease Hypogonadism- continue replacement therapy, check testosterone levels as needed.  - TSH  2. Obesity Obesity with co morbidities- long discussion about weight loss, diet, and exercise - will try contrave, has failed phentermine, has HTN, BMI is 42 - Lipid panel - Hemoglobin A1c - Insulin, fasting  3. Screening for diabetes mellitus - Hemoglobin A1c - Insulin, fasting  4. Essential hypertension - CBC with Differential/Platelet - BASIC METABOLIC PANEL WITH GFR - Hepatic function panel - Urinalysis, Routine w reflex microscopic (not at [facility]) - Microalbumin / creatinine urine ratio  5. Medication management - Magnesium  6. Vitamin D deficiency - Vit D  25 hydroxy (rtn osteoporosis monitoring)  7. Routine general medical examination at a health care facility Get [facility], get colonoscopy  8. Anemia, unspecified anemia type - Iron and TIBC - Ferritin - Vitamin B12   Discussed med's effects and SE's. Screening labs and tests as requested with regular follow-up as recommended.  HPI 54 y.o. female  presents for a complete physical.  Her blood pressure has been controlled at home, it is often 130/80's at home and she has white coat syndrome, today their BP is BP: 138/80 mmHg She does not workout due to pain right now but is getting back into it. She denies chest pain, shortness of breath, dizziness.  She is not on cholesterol medication and denies myalgias. Her cholesterol is at goal. The cholesterol last visit was:   Lab Results  Component Value Date   CHOL 188 01/24/2014   HDL 46 01/24/2014   LDLCALC 123* 01/24/2014   TRIG 94 01/24/2014   CHOLHDL 4.1 01/24/2014   Last A1C in the office was:  Lab Results  Component Value Date   HGBA1C 5.5 01/24/2014   Patient is on Vitamin D supplement.   Lab Results  Component Value Date   VD25OH 36 05/10/2014  Had right knee arthroscopy in Nov 2014, most recent Xray showed  bone on bone right knee. She has looked into getting a replacement but wants to try to wait.  She has gained 30 lbs due to inactivity from her knee and from stress. Taking care of her mom but her sister is helping. She could not use phentermine due to increased BP and nausea.  Wt Readings from Last 3 Encounters:  02/21/15 277 lb 6.4 oz (125.828 kg)  05/10/14 258 lb (117.028 kg)  01/24/14 256 lb (116.121 kg)   She is on thyroid medication. Her medication was changed last visit. Patient denies heat / cold intolerance, nervousness and palpitations.  Lab Results  Component Value Date   TSH 1.557 05/10/2014  .  Current Medications:  Current Outpatient Prescriptions on File Prior to Visit  Medication Sig Dispense Refill  . buPROPion (WELLBUTRIN XL) 150 MG 24 hr tablet TAKE ONE TABLET BY MOUTH ONCE DAILY IN THE MORNING 90 tablet 1  . Cholecalciferol (VITAMIN D PO) Take 5,000 Int'l Units by mouth daily.    . Glucosamine-Chondroit-Vit C-Mn (GLUCOSAMINE CHONDR 1500 COMPLX PO) Take by mouth daily.    . hydrochlorothiazide (HYDRODIURIL) 25 MG tablet Take 1 tablet (25 mg total) by mouth daily. 30 tablet 0  . levothyroxine (SYNTHROID, LEVOTHROID) 150 MCG tablet TAKE ONE TABLET BY MOUTH ONCE DAILY AS DIRECTED 90 tablet 0  . Omega-3 Fatty Acids (FISH OIL PO) Take 2,000 mg by mouth daily.    Marland Kitchen OVER THE COUNTER MEDICATION 400 mg daily. Bio Cocumin    . traMADol (ULTRAM) 50 MG  tablet Take 50 mg by mouth every 6 (six) hours as needed.     No current facility-administered medications on file prior to visit.   Health Maintenance:   Immunization History  Administered Date(s) Administered  . Influenza Split 05/10/2014  . Td 06/11/2007   Tetanus: 2009 Pneumovax: Prevnar 13: Flu vaccine: 2015 Zostavax: Pap: June 2014 normal PAP due 2017 [name] December 29 2013 q 31 MGM: August 2015  CAT B DEXA: n/A Colonoscopy: [facility] in [place]- 2010 due 2015 EGD:  Allergies: No Known Allergies Medical History:  Past  Medical History  Diagnosis Date  . Thyroid disease    Surgical History: No past surgical history on file. Family History:  Family History  Problem Relation Age of Onset  . Hypertension Mother   . Alzheimer's disease Mother 55  . Cancer Father   . Hypertension Father   . Arthritis Father   . Gout Father   . Depression Brother   . Mental illness Daughter   . Alzheimer's disease Maternal Grandfather 85   Social History:  Social History  Substance Use Topics  . Smoking status: Never Smoker   . Smokeless tobacco: Never Used  . Alcohol Use: No   Review of Systems  Constitutional: Negative.   HENT: Negative.   Eyes: Negative.   Respiratory: Negative.   Cardiovascular: Negative.   Gastrointestinal: Negative.   Genitourinary: Negative.   Musculoskeletal: Positive for joint pain (right knee worse than left). Negative for myalgias, back pain, falls and neck pain.  Skin: Negative.   Neurological: Negative.   Psychiatric/Behavioral: Negative.      Physical Exam: Estimated body mass index is 42.19 kg/(m^2) as calculated from the following:   Height as of this encounter: 5\' 8"  (1.727 m).   Weight as of this encounter: 277 lb 6.4 oz (125.828 kg). BP 138/80 mmHg  Pulse 80  Temp(Src) 97.9 F (36.6 C)  Resp 16  Ht 5\' 8"  (1.727 m)  Wt 277 lb 6.4 oz (125.828 kg)  BMI 42.19 kg/m2 General Appearance: Well nourished, in no apparent distress. Eyes: PERRLA, EOMs, conjunctiva no swelling or erythema, normal fundi and vessels. Sinuses: No Frontal/maxillary tenderness ENT/Mouth: Ext aud canals clear, normal light reflex with TMs without erythema, bulging.  Good dentition. No erythema, swelling, or exudate on post pharynx. Tonsils not swollen or erythematous. Hearing normal.  Neck: Supple, thyroid normal. No bruits Respiratory: Respiratory effort normal, BS equal bilaterally without rales, rhonchi, wheezing or stridor. Cardio: RRR without murmurs, rubs or gallops. Brisk peripheral pulses  without edema.  Chest: symmetric, with normal excursions and percussion. Breasts: Symmetric, without lumps, nipple discharge, retractions. Abdomen: Soft, +BS. Non tender, no guarding, rebound, hernias, masses, or organomegaly. .  Lymphatics: Non tender without lymphadenopathy.  Genitourinary: defer Musculoskeletal: Full ROM all peripheral extremities,5/5 strength, and antalgic gait Skin: Warm, dry without rashes, lesions, ecchymosis.  Neuro: Cranial nerves intact, reflexes equal bilaterally. Normal muscle tone, no cerebellar symptoms. Sensation intact.  Psych: Awake and oriented X 3, normal affect, Insight and Judgment appropriate.   EKG: declines AORTA SCAN: declines   Nerine Pulse 3:00 PM

## 2015-02-21 NOTE — Patient Instructions (Addendum)
Jenny Arroyo, call and schedule colonoscopy Phone: [phone];  Get Mammogram  We want weight loss that will last so you should lose 1-2 pounds a week.  THAT IS IT! Please pick THREE things a month to change. Once it is a habit check off the item. Then pick another three items off the list to become habits.  If you are already doing a habit on the list GREAT!  Cross that item off! o Don't drink your calories. Ie, alcohol, soda, fruit juice, and sweet tea.  o Drink more water. Drink a glass when you feel hungry or before each meal.  o Eat breakfast - Complex carb and protein (likeDannon light and fit yogurt, oatmeal, fruit, eggs, [place] bacon). o Measure your cereal.  Eat no more than one cup a day. (ie [place]) o Eat an apple a day. o Add a vegetable a day. o Try a new vegetable a month. o Use Pam! Stop using oil or butter to cook. o Don't finish your plate or use smaller plates. o Share your dessert. o Eat sugar free Jello for dessert or frozen grapes. o Don't eat 2-3 hours before bed. o Switch to whole wheat bread, pasta, and brown rice. o Make healthier choices when you eat out. No fries! o Pick baked chicken, NOT fried. o Don't forget to SLOW DOWN when you eat. It is not going anywhere.  o Take the stairs. o Park far away in the parking lot o [employer] (or weights) for 10 minutes while watching TV. o Walk at work for 10 minutes during break. o Walk outside 1 time a week with your friend, kids, dog, or significant other. o Start a walking group at [place] the mall as much as you can tolerate.  o Keep a food diary. o Weigh yourself daily. o Walk for 15 minutes 3 days per week. o Cook at home more often and eat out less.  If life happens and you go back to old habits, it is okay.  Just start over. You can do it!   If you experience chest pain, get short of breath, or tired during the exercise, please stop immediately and inform your doctor.  Before you even begin to  attack a weight-loss plan, it pays to remember this: You are not fat. You have fat. Losing weight isn't about blame or shame; it's simply another achievement to accomplish. Dieting is like any other skill-you have to buckle down and work at it. As long as you act in a smart, reasonable way, you'll ultimately get where you want to be. Here are some weight loss pearls for you.  1. It's Not a Diet. It's a Lifestyle Thinking of a diet as something you're on and suffering through only for the short term doesn't work. To shed weight and keep it off, you need to make permanent changes to the way you eat. It's OK to indulge occasionally, of course, but if you cut calories temporarily and then revert to your old way of eating, you'll gain back the weight quicker than you can say yo-yo. Use it to lose it. Research shows that one of the best predictors of long-term weight loss is how many pounds you drop in the first month. For that reason, nutritionists often suggest being stricter for the first two weeks of your new eating strategy to build momentum. Cut out added sugar and alcohol and avoid unrefined carbs. After that, figure out how you can reincorporate them in  a way that's healthy and maintainable.  2. There's a Right Way to Exercise Working out burns calories and fat and boosts your metabolism by building muscle. But those trying to lose weight are notorious for overestimating the number of calories they burn and underestimating the amount they take in. Unfortunately, your system is biologically programmed to hold on to extra pounds and that means when you start exercising, your body senses the deficit and ramps up its hunger signals. If you're not diligent, you'll eat everything you burn and then some. Use it to lose it. Cardio gets all the exercise glory, but strength and interval training are the real heroes. They help you build lean muscle, which in turn increases your metabolism and calorie-burning  ability 3. Don't Overreact to Mild Hunger Some people have a hard time losing weight because of hunger anxiety. To them, being hungry is bad-something to be avoided at all costs-so they carry snacks with them and eat when they don't need to. Others eat because they're stressed out or bored. While you never want to get to the point of being ravenous (that's when bingeing is likely to happen), a hunger pang, a craving, or the fact that it's 3:00 p.m. should not send you racing for the vending machine or obsessing about the energy bar in your purse. Ideally, you should put off eating until your stomach is growling and it's difficult to concentrate.  Use it to lose it. When you feel the urge to eat, use the HALT method. Ask yourself, Am I really hungry? Or am I angry or anxious, lonely or bored, or tired? If you're still not certain, try the apple test. If you're truly hungry, an apple should seem delicious; if it doesn't, something else is going on. Or you can try drinking water and making yourself busy, if you are still hungry try a healthy snack.  4. Not All Calories Are Created Equal The mechanics of weight loss are pretty simple: Take in fewer calories than you use for energy. But the kind of food you eat makes all the difference. Processed food that's high in saturated fat and refined starch or sugar can cause inflammation that disrupts the hormone signals that tell your brain you're full. The result: You eat a lot more.  Use it to lose it. Clean up your diet. Swap in whole, unprocessed foods, including vegetables, lean protein, and healthy fats that will fill you up and give you the biggest nutritional bang for your calorie buck. In a few weeks, as your brain starts receiving regular hunger and fullness signals once again, you'll notice that you feel less hungry overall and naturally start cutting back on the amount you eat.  5. Protein, Produce, and Plant-Based Fats Are Your Weight-Loss Trinity Here's  why eating the three Ps regularly will help you drop pounds. Protein fills you up. You need it to build lean muscle, which keeps your metabolism humming so that you can torch more fat. People in a weight-loss program who ate double the recommended daily allowance for protein (about 110 grams for a 150-pound woman) lost 70 percent of their weight from fat, while people who ate the RDA lost only about 40 percent, one study found. Produce is packed with filling fiber. "It's very difficult to consume too many calories if you're eating a lot of vegetables. Example: Three cups of broccoli is a lot of food, yet only 93 calories. (Fruit is another story. It can be easy to overeat and can  contain a lot of calories from sugar, so be sure to monitor your intake.) Plant-based fats like olive oil and those in avocados and nuts are healthy and extra satiating.  Use it to lose it. Aim to incorporate each of the three Ps into every meal and snack. People who eat protein throughout the day are able to keep weight off, according to a study in the [place] of Clinical Nutrition. In addition to meat, poultry and seafood, good sources are beans, lentils, eggs, tofu, and yogurt. As for fat, keep portion sizes in check by measuring out salad dressing, oil, and nut butters (shoot for one to two tablespoons). Finally, eat veggies or a little fruit at every meal. People who did that consumed 308 fewer calories but didn't feel any hungrier than when they didn't eat more produce.  7. How You Eat Is As Important As What You Eat In order for your brain to register that you're full, you need to focus on what you're eating. Sit down whenever you eat, preferably at a table. Turn off the TV or computer, put down your phone, and look at your food. Smell it. Chew slowly, and don't put another bite on your fork until you swallow. When women ate lunch this attentively, they consumed 30 percent less when snacking later than those who  listened to an audiobook at lunchtime, according to a study in the [employer] of Nutrition. 8. Weighing Yourself Really Works The scale provides the best evidence about whether your efforts are paying off. Seeing the numbers tick up or down or stagnate is motivation to keep going-or to rethink your approach. A 2015 study at [facility] found that daily weigh-ins helped people lose more weight, keep it off, and maintain that loss, even after two years. Use it to lose it. Step on the scale at the same time every day for the best results. If your weight shoots up several pounds from one weigh-in to the next, don't freak out. Eating a lot of salt the night before or having your period is the likely culprit. The number should return to normal in a day or two. It's a steady climb that you need to do something about. 9. Too Much Stress and Too Little Sleep Are Your Enemies When you're tired and frazzled, your body cranks up the production of cortisol, the stress hormone that can cause carb cravings. Not getting enough sleep also boosts your levels of ghrelin, a hormone associated with hunger, while suppressing leptin, a hormone that signals fullness and satiety. People on a diet who slept only five and a half hours a night for two weeks lost 55 percent less fat and were hungrier than those who slept eight and a half hours, according to a study in the [place]. Use it to lose it. Prioritize sleep, aiming for seven hours or more a night, which research shows helps lower stress. And make sure you're getting quality zzz's. If a snoring spouse or a fidgety cat wakes you up frequently throughout the night, you may end up getting the equivalent of just four hours of sleep, according to a study from [facility]. Keep pets out of the bedroom, and use a white-noise app to drown out snoring. 10. You Will Hit a plateau-And You Can Bust Through It As you slim down, your body  releases much less leptin, the fullness hormone.  If you're not strength training, start right now. Building muscle can raise your metabolism  to help you overcome a plateau. To keep your body challenged and burning calories, incorporate new moves and more intense intervals into your workouts or add another sweat session to your weekly routine. Alternatively, cut an extra 100 calories or so a day from your diet. Now that you've lost weight, your body simply doesn't need as much fuel.

## 2015-02-22 LAB — MAGNESIUM: Magnesium: 1.8 mg/dL (ref 1.5–2.5)

## 2015-02-22 LAB — FERRITIN: Ferritin: 11 ng/mL (ref 10–291)

## 2015-02-22 LAB — BASIC METABOLIC PANEL WITH GFR
BUN: 9 mg/dL (ref 7–25)
CALCIUM: 9.3 mg/dL (ref 8.6–10.4)
CHLORIDE: 100 mmol/L (ref 98–110)
CO2: 29 mmol/L (ref 20–31)
Creat: 0.63 mg/dL (ref 0.50–1.05)
GFR, Est African American: >89 mL/min (ref >=60)
Glucose, Bld: 87 mg/dL (ref 65–99)
POTASSIUM: 3.7 mmol/L (ref 3.5–5.3)
SODIUM: 137 mmol/L (ref 135–146)

## 2015-02-22 LAB — MICROALBUMIN / CREATININE URINE RATIO
CREATININE, URINE: 57.3 mg/dL
MICROALB UR: 0.2 mg/dL (ref <2.0)
MICROALB/CREAT RATIO: 3.5 mg/g (ref 0.0–30.0)

## 2015-02-22 LAB — INSULIN, FASTING: Insulin fasting, serum: 18 u[IU]/mL (ref 2.0–19.6)

## 2015-02-22 LAB — IRON AND TIBC
%SAT: 15 % (ref 11–50)
IRON: 53 ug/dL (ref 45–160)
TIBC: 349 ug/dL (ref 250–450)
UIBC: 296 ug/dL (ref 125–400)

## 2015-02-22 LAB — HEPATIC FUNCTION PANEL
ALT: 30 U/L — AB (ref 6–29)
AST: 22 U/L (ref 10–35)
Albumin: 3.8 g/dL (ref 3.6–5.1)
Alkaline Phosphatase: 79 U/L (ref 33–130)
BILIRUBIN DIRECT: 0.1 mg/dL (ref <=0.2)
BILIRUBIN INDIRECT: 0.4 mg/dL (ref 0.2–1.2)
Total Bilirubin: 0.5 mg/dL (ref 0.2–1.2)
Total Protein: 6.2 g/dL (ref 6.1–8.1)

## 2015-02-22 LAB — URINALYSIS, ROUTINE W REFLEX MICROSCOPIC
BILIRUBIN URINE: NEGATIVE
Glucose, UA: NEGATIVE
Hgb urine dipstick: NEGATIVE
Leukocytes, UA: NEGATIVE
NITRITE: NEGATIVE
Protein, ur: NEGATIVE
SPECIFIC GRAVITY, URINE: 1.007 (ref 1.001–1.035)
pH: 6 (ref 5.0–8.0)

## 2015-02-22 LAB — LIPID PANEL
CHOL/HDL RATIO: 3.8 ratio (ref <=5.0)
CHOLESTEROL: 193 mg/dL (ref 125–200)
HDL: 51 mg/dL (ref >=46)
LDL Cholesterol: 117 mg/dL (ref <130)
Triglycerides: 125 mg/dL (ref <150)
VLDL: 25 mg/dL (ref <30)

## 2015-02-22 LAB — VITAMIN B12: Vitamin B-12: 404 pg/mL (ref 211–911)

## 2015-02-22 LAB — TSH: TSH: 0.628 u[IU]/mL (ref 0.350–4.500)

## 2015-02-22 LAB — VITAMIN D 25 HYDROXY (VIT D DEFICIENCY, FRACTURES): Vit D, 25-Hydroxy: 38 ng/mL (ref 30–100)

## 2015-03-28 ENCOUNTER — Other Ambulatory Visit: Payer: Self-pay | Admitting: Physician Assistant

## 2015-04-13 ENCOUNTER — Encounter: Payer: Self-pay | Admitting: Physician Assistant

## 2015-04-13 ENCOUNTER — Other Ambulatory Visit: Payer: Self-pay | Admitting: Physician Assistant

## 2015-04-13 MED ORDER — BUPROPION HCL ER (XL) 150 MG PO TB24: ORAL_TABLET | ORAL | Status: DC

## 2015-04-17 ENCOUNTER — Encounter: Payer: Self-pay | Admitting: Physician Assistant

## 2015-04-17 ENCOUNTER — Ambulatory Visit (INDEPENDENT_AMBULATORY_CARE_PROVIDER_SITE_OTHER): Payer: PRIVATE HEALTH INSURANCE | Admitting: Physician Assistant

## 2015-04-17 VITALS — BP 128/80 | HR 76 | Temp 97.9°F | Resp 16 | Ht 68.0 in | Wt 264.0 lb

## 2015-04-17 DIAGNOSIS — Z79899 Other long term (current) drug therapy: Secondary | ICD-10-CM

## 2015-04-17 DIAGNOSIS — H8112 Benign paroxysmal vertigo, left ear: Secondary | ICD-10-CM

## 2015-04-17 DIAGNOSIS — E079 Disorder of thyroid, unspecified: Secondary | ICD-10-CM | POA: Diagnosis not present

## 2015-04-17 DIAGNOSIS — E669 Obesity, unspecified: Secondary | ICD-10-CM

## 2015-04-17 LAB — CBC WITH DIFFERENTIAL/PLATELET
BASOS ABS: 0 10*3/uL (ref 0.0–0.1)
Basophils Relative: 0 % (ref 0–1)
EOS PCT: 2 % (ref 0–5)
Eosinophils Absolute: 0.1 10*3/uL (ref 0.0–0.7)
HEMATOCRIT: 39.7 % (ref 36.0–46.0)
Hemoglobin: 13.2 g/dL (ref 12.0–15.0)
LYMPHS PCT: 20 % (ref 12–46)
Lymphs Abs: 1.5 10*3/uL (ref 0.7–4.0)
MCH: 28.3 pg (ref 26.0–34.0)
MCHC: 33.2 g/dL (ref 30.0–36.0)
MCV: 85 fL (ref 78.0–100.0)
MPV: 10.4 fL (ref 8.6–12.4)
Monocytes Absolute: 0.5 10*3/uL (ref 0.1–1.0)
Monocytes Relative: 7 % (ref 3–12)
NEUTROS ABS: 5.3 10*3/uL (ref 1.7–7.7)
Neutrophils Relative %: 71 % (ref 43–77)
Platelets: 279 10*3/uL (ref 150–400)
RBC: 4.67 MIL/uL (ref 3.87–5.11)
RDW: 16.3 % — AB (ref 11.5–15.5)
WBC: 7.4 10*3/uL (ref 4.0–10.5)

## 2015-04-17 LAB — HEPATIC FUNCTION PANEL
ALBUMIN: 3.8 g/dL (ref 3.6–5.1)
ALK PHOS: 99 U/L (ref 33–130)
ALT: 19 U/L (ref 6–29)
AST: 16 U/L (ref 10–35)
BILIRUBIN TOTAL: 0.4 mg/dL (ref 0.2–1.2)
Bilirubin, Direct: 0.1 mg/dL (ref <=0.2)
Indirect Bilirubin: 0.3 mg/dL (ref 0.2–1.2)
Total Protein: 6.3 g/dL (ref 6.1–8.1)

## 2015-04-17 LAB — BASIC METABOLIC PANEL WITH GFR
BUN: 10 mg/dL (ref 7–25)
CO2: 28 mmol/L (ref 20–31)
Calcium: 9.2 mg/dL (ref 8.6–10.4)
Chloride: 102 mmol/L (ref 98–110)
Creat: 0.66 mg/dL (ref 0.50–1.05)
GLUCOSE: 84 mg/dL (ref 65–99)
POTASSIUM: 3.6 mmol/L (ref 3.5–5.3)
SODIUM: 140 mmol/L (ref 135–146)

## 2015-04-17 MED ORDER — FLUTICASONE PROPIONATE 50 MCG/ACT NA SUSP: 2.0000 | Freq: Every day | NASAL | Status: DC

## 2015-04-17 MED ORDER — DIAZEPAM 5 MG PO TABS: ORAL_TABLET | ORAL | Status: DC

## 2015-04-17 MED ORDER — PREDNISONE 20 MG PO TABS: ORAL_TABLET | ORAL | Status: DC

## 2015-04-17 NOTE — Patient Instructions (Signed)
Your ears and sinuses are connected by the eustachian tube. When your sinuses are inflamed, this can close off the tube and cause fluid to collect in your middle ear. This can then cause dizziness, popping, clicking, ringing, and echoing in your ears. This is often NOT an infection and does NOT require antibiotics, it is caused by inflammation so the treatments help the inflammation. This can take a long time to get better so please be patient.  Here are things you can do to help with this: - Try the Flonase or Nasonex. Remember to spray each nostril twice towards the outer part of your eye.  Do not sniff but instead pinch your nose and tilt your head back to help the medicine get into your sinuses.  The best time to do this is at bedtime.Stop if you get blurred vision or nose bleeds.  -While drinking fluids, pinch and hold nose close and swallow, to help open eustachian tubes to drain fluid behind ear drums. -Please pick one of the over the counter allergy medications below and take it once daily for allergies.  It will also help with fluid behind ear drums. Claritin or loratadine cheapest but likely the weakest  Zyrtec or certizine at night because it can make you sleepy The strongest is allegra or fexafinadine  Cheapest at [employer], [employer], [employer] -can use decongestant over the counter, please do not use if you have high blood pressure or certain heart conditions.   if worsening HA, changes vision/speech, imbalance, weakness go to the ER    Benign Paroxysmal Positional Vertigo (BPPV)  General Information In Benign Paroxysmal Positional Vertigo (BPPV) dizziness is generally thought to be due to debris which has collected within a part of the inner ear. This debris can be thought of as "ear rocks", although the formal name is "otoconia". Ear rocks are small crystals of calcium carbonate derived from a structure in the ear called the "utricle" (figure to the right ). The symptoms of BPPV include  dizziness or vertigo, lightheadedness, imbalance, and nausea. Activities which bring on symptoms will vary among persons, but symptoms are usually followed by a change of position of the head like getting out of bed or rolling over in bed are common "problem" motions .  However if you have worsening HA, changes vision/speech, weakness go to the ER     What can be done? 1) Use two or more pillows at night. Avoid sleeping on the "bad" side. In the morning, get up slowly and sit on the edge of the bed for a minute. 2) Medication prescribed by your doctor.  3) The exercises below, you can do at home to help you prevent the sensation later in the day.  4) We can refer you to physical therapy at [facility], # [phone]  Home treatments: The Brandt-Daroff Exercises are a home method of treating BPPV,and are effective 95% of the time.  These exercises are performed in three sets per day for two weeks. In each set, one performs the maneuver as shown five times. Start sitting upright (position 1). Then move into the side-lying position (position 2), with the head angled upward about halfway. An easy way to remember this is to imagine someone standing about 6 feet in front of you, and just keep looking at their head at all times. Stay in the side-lying position for 30 seconds, or until the dizziness subsides if this is longer, then go back to the sitting position (position 3).   Stay there for 30 seconds, and then go to the opposite side (position 4) and follow the same routine.  At home Epley Maneuver This procedure seems to be even more effective than the in-office procedure, perhaps because it is repeated every night for a week.  The method (for the left side) is performed as shown on the figure below.  1) One stays in each of the supine (lying down) positions for 30 seconds, and in the sitting upright position (top) for 1 minute.  2) Thus, once cycle takes 2 1/2 minutes.   3) Typically 3 cycles are performed just prior to going to sleep.  4) It is best to do them at night rather than in the morning or midday, as if one becomes dizzy following the exercises, then it can resolve while one is sleeping.  The mirror image of this procedure is used for the right ear.  Labyrinthitis Labyrinthitis is an infection of the inner ear. Your inner ear is a fluid-filled system of tubes and canals (labyrinth). Nerve cells in your inner ear send signals for hearing and balance to your brain. When tiny germs (microorganisms) get inside the labyrinth, they harm the cells that send messages to the brain. Labyrinthitis can cause changes in hearing and balance. Most cases of labyrinthitis come on suddenly and they clear up within weeks. If the infection damages parts of the labyrinth, some symptoms may remain (chronic labyrinthitis). CAUSES Viruses are the most common cause of labyrinthitis. Viruses that spread into the labyrinth are the same viruses that cause other diseases, such as:  Mononucleosis.  Measles.  Flu.  Herpes. Bacteria can also cause labyrinthitis when they spread into the labyrinth from an infection in the brain or the middle ear. Bacteria can cause:  Serous labyrinthitis. This type of labyrinthitis develops when bacteria produce a poison (toxin) that gets inside the labyrinth.  Suppurative labyrinthitis. This type of labyrinthitis develops when bacteria get inside the labyrinth. RISK FACTORS You may be at greater risk for labyrinthitis if you:  Drink a lot of alcohol.  Smoke.  Take certain drugs.  Are not well rested (fatigued).  Are under a lot of stress.  Have allergies.  Recently had a nose or throat infection (upper respiratory infection) or an ear infection. SYMPTOMS Symptoms of labyrinthitis usually start suddenly. The symptoms can be mild or strong and may include:  Dizziness.  Hearing loss.  A feeling that you are moving when you are  not (vertigo).  Ringing in the ear (tinnitus).  Nausea and vomiting.  Trouble focusing your eyes. Symptoms of chronic labyrinthitis may include:  Fatigue.  Confusion.  Hearing loss.  Tinnitus.  Poor balance.  Vertigo after sudden head movements. DIAGNOSIS Your health care provider may suspect labyrinthitis if you suddenly get dizzy and lose hearing, especially if you had a recent upper respiratory infection. Your health care provider will perform a physical exam to:  Check your ears for infection.  Test your balance.  Check your eye movement. Your health care provider may do several tests to rule out other causes of your symptoms and to help make a diagnosis of labyrinthitis. These may include:  Imaging studies, such as a CT scan or an MRI, to look for other causes of your symptoms.  Hearing tests.  Electronystagmography (ENG) to check your balance. TREATMENT Treatment of labyrinthitis depends on the cause. If your labyrinthitis is caused by a virus, it may get better without treatment. If your labyrinthitis is caused by bacteria, you  may need medicine to fight the infection (antibiotic medicine). You may also have treatment to relieve labyrinthitis symptoms. Treatments may include:  Medicines to:  Stop dizziness.  Relieve nausea.  Treat the inflamed area.  Speed up your recovery.  Bed rest until dizziness goes away.  Fluids given through an IV tube. You may need this treatment if you have too little fluid in your body (dehydrated) from repeated nausea and vomiting. HOME CARE INSTRUCTIONS  Take medicines only as directed by your health care provider.  If you were prescribed an antibiotic medicine, finish all of it even if you start to feel better.  Rest as much as possible.  Avoid loud noises and bright lights.  Do not make sudden movements until any dizziness goes away.  Do not drive until your health care provider says that you can.  Drink enough  fluid to keep your urine clear or pale yellow.  Work with a physical therapist if you still feel dizzy after several weeks. A therapist can teach you exercises to help you adjust to feeling dizzy (vestibular rehabilitation exercises).  Keep all follow-up visits as directed by your health care provider. This is important. SEEK MEDICAL CARE IF:  Your symptoms are not relieved by medicines.  Your symptoms last longer than two weeks.  You have a fever. SEEK IMMEDIATE MEDICAL CARE IF:  You become very dizzy.  You have nausea or vomiting that does not go away.  Your hearing gets much worse very quickly.   This information is not intended to replace advice given to you by your health care provider. Make sure you discuss any questions you have with your health care provider.   Document Released: 06/17/2014 Document Reviewed: 06/17/2014 Elsevier Interactive Patient Education [employer].

## 2015-04-17 NOTE — Progress Notes (Signed)
Subjective:    Patient ID: Jenny Arroyo, female    DOB: Sep 14, 1960, 54 y.o.   MRN: [id number]  HPI 54 y.o. obese female with history HTN, hypothyroidism presents with vertigo. She had her CPE in Sept and was started on Contrave due to failure on phentermine. She has had weight loss with it but started to have vomiting, nausea, hot flashes and dry mouth with dizziness.    She states that her daughter had + flu and she has felt bad in Oct and was given augmentin. Had nausea, vomiting, and had yeast infection. She has had dizziness since this time. She was off the wellbutrin while on the contrave. She is on HCTZ 25mg . She has not had anymore nausea or vomiting since stopping the ABX, no more hot flashes since stopping the contrave. She is not feeling run down, she has vertigo/sensation of movement with getting out of bed, or quick turns of her head. She has had headaches, no changes in speech, vision, hearing. No LE/UE weakness.   Blood pressure 128/80, pulse 76, temperature 97.9 F (36.6 C), temperature source Temporal, resp. rate 16, height 5\' 8"  (1.727 m), weight 264 lb (119.75 kg), last menstrual period 03/30/2015, SpO2 96 %.  Past Medical History  Diagnosis Date  . Thyroid disease    Current Outpatient Prescriptions on File Prior to Visit  Medication Sig Dispense Refill  . buPROPion (WELLBUTRIN XL) 150 MG 24 hr tablet TAKE ONE TABLET BY MOUTH ONCE DAILY IN THE MORNING 90 tablet 0  . Cholecalciferol (VITAMIN D PO) Take 5,000 Int'l Units by mouth daily.    . Glucosamine-Chondroit-Vit C-Mn (GLUCOSAMINE CHONDR 1500 COMPLX PO) Take by mouth daily.    . hydrochlorothiazide (HYDRODIURIL) 25 MG tablet TAKE ONE TABLET BY MOUTH ONCE DAILY 30 tablet 1  . levothyroxine (SYNTHROID, LEVOTHROID) 150 MCG tablet Take 1 tablet (150 mcg total) by mouth daily before breakfast. 90 tablet 1  . Naltrexone-Bupropion HCl ER (CONTRAVE) 8-90 MG TB12 1 pill a morning for 1 week, then 1 pill twice a day for 1  week, 2 pills in the morning and one at night for 1 week, then 2 pill twice a day for 1 week and stay on this dose. 120 tablet 2  . Omega-3 Fatty Acids (FISH OIL PO) Take 2,000 mg by mouth daily.    Marland Kitchen OVER THE COUNTER MEDICATION 400 mg daily. Bio Cocumin     No current facility-administered medications on file prior to visit.    Review of Systems  Constitutional: Negative.  Negative for fever, chills and fatigue.  HENT: Positive for congestion and tinnitus. Negative for dental problem, drooling, ear discharge, ear pain, facial swelling, hearing loss, mouth sores, nosebleeds, postnasal drip, rhinorrhea, sinus pressure, sneezing, sore throat, trouble swallowing and voice change.   Eyes: Negative.  Negative for visual disturbance.  Respiratory: Negative.   Cardiovascular: Negative.   Gastrointestinal: Negative.   Musculoskeletal: Negative.  Negative for neck pain and neck stiffness.  Neurological: Positive for dizziness. Negative for tremors, seizures, syncope, facial asymmetry, speech difficulty, weakness, light-headedness, numbness and headaches.  Psychiatric/Behavioral: Negative.        Objective:   Physical Exam  Constitutional: She is oriented to person, place, and time. She appears well-developed and well-nourished. No distress.  HENT:  Head: Normocephalic.  Right Ear: Hearing and external ear normal. No mastoid tenderness. Tympanic membrane is injected. Tympanic membrane is not perforated, not erythematous, not retracted and not bulging. A middle ear effusion is present.  Left Ear:  Hearing and external ear normal. No mastoid tenderness. Tympanic membrane is injected. Tympanic membrane is not perforated, not erythematous, not retracted and not bulging. A middle ear effusion is present.  Nose: Right sinus exhibits no maxillary sinus tenderness. Left sinus exhibits no maxillary sinus tenderness.  Mouth/Throat: Uvula is midline, oropharynx is clear and moist and mucous membranes are  normal.  Eyes: Conjunctivae and EOM are normal. Pupils are equal, round, and reactive to light.  Neck: Neck supple.  Cardiovascular: Normal rate and regular rhythm.   Pulmonary/Chest: Effort normal and breath sounds normal. No respiratory distress. She has no wheezes.  Abdominal: Soft. Bowel sounds are normal.  Musculoskeletal: Normal range of motion.  Lymphadenopathy:    She has no cervical adenopathy.  Neurological: She is alert and oriented to person, place, and time. She displays normal reflexes. No cranial nerve deficit. She exhibits normal muscle tone. Coordination normal.  + dix hallpike to the left  Skin: Skin is warm and dry.       Assessment & Plan:  Vertigo- nonsmoker, normal neuro, does not have history of vertigo in the past, valium PRN, exercises given, add bASA, check labs to rule out dehydration/electrolyte abnormality with HCTZ and recent N/V, prednisone for possible labyrinthitis with recent sinus infection if worsening HA, changes vision/speech, imbalance, weakness go to the ER

## 2015-04-18 LAB — TSH: TSH: 0.732 u[IU]/mL (ref 0.350–4.500)

## 2015-04-30 ENCOUNTER — Encounter: Payer: Self-pay | Admitting: *Deleted

## 2015-05-23 ENCOUNTER — Encounter: Payer: Self-pay | Admitting: Physician Assistant

## 2015-05-23 ENCOUNTER — Ambulatory Visit (INDEPENDENT_AMBULATORY_CARE_PROVIDER_SITE_OTHER): Payer: PRIVATE HEALTH INSURANCE | Admitting: Physician Assistant

## 2015-05-23 VITALS — BP 126/76 | HR 73 | Temp 97.0°F | Resp 16 | Ht 68.0 in | Wt 261.0 lb

## 2015-05-23 DIAGNOSIS — E079 Disorder of thyroid, unspecified: Secondary | ICD-10-CM

## 2015-05-23 DIAGNOSIS — Z0000 Encounter for general adult medical examination without abnormal findings: Secondary | ICD-10-CM

## 2015-05-23 DIAGNOSIS — E669 Obesity, unspecified: Secondary | ICD-10-CM

## 2015-05-23 DIAGNOSIS — Z79899 Other long term (current) drug therapy: Secondary | ICD-10-CM | POA: Diagnosis not present

## 2015-05-23 DIAGNOSIS — E559 Vitamin D deficiency, unspecified: Secondary | ICD-10-CM | POA: Diagnosis not present

## 2015-05-23 DIAGNOSIS — I10 Essential (primary) hypertension: Secondary | ICD-10-CM | POA: Diagnosis not present

## 2015-05-23 MED ORDER — DIAZEPAM 5 MG PO TABS: ORAL_TABLET | ORAL | Status: DC

## 2015-05-23 MED ORDER — NALTREXONE-BUPROPION HCL ER 8-90 MG PO TB12: ORAL_TABLET | ORAL | Status: DC

## 2015-05-23 NOTE — Progress Notes (Signed)
Assessment and Plan:  Hypertension: Continue medication, monitor blood pressure at home. Continue DASH diet.  Reminder to go to the ER if any CP, SOB, nausea, dizziness, severe HA, changes vision/speech, left arm numbness and tingling, and jaw pain. Cholesterol: Continue diet and exercise.  Hypothyroidism-check TSH level, continue medications the same, reminded to take on an empty stomach 30-68mins before food.  Vitamin D Def-continue medications.  Obesity with co morbidities- long discussion about weight loss, diet, and exercise, will continue contrave Anxiety/vertigo- will refill valium #60 NR  Continue diet and meds as discussed. Had recent labs 1 month ago, will check in 4-5 month for follow up on contrave Future Appointments Date Time Provider [place]  02/22/2016 3:00 PM Vicie Mutters, PA-C [id number] None    HPI 54 y.o. female  presents for 3 month follow up with hypertension, hyperlipidemia, prediabetes and vitamin D. Her blood pressure has not been controlled at home, today their BP is BP: 126/76 mmHg  She does workout, she has been having some continuing knee pain but she is doing better. She denies chest pain, shortness of breath, dizziness.  She has not had any vertigo symptoms since taking prednisone.  She is not on cholesterol medication and denies myalgias. Her cholesterol is at goal. The cholesterol last visit was:   Lab Results  Component Value Date   CHOL 193 02/21/2015   HDL 51 02/21/2015   LDLCALC 117 02/21/2015   TRIG 125 02/21/2015   CHOLHDL 3.8 02/21/2015    Last A1C in the office was:  Lab Results  Component Value Date   HGBA1C 5.8* 02/21/2015   Patient is on Vitamin D supplement.   Lab Results  Component Value Date   VD25OH 38 02/21/2015     She is on thyroid medication. Her medication was not changed last visit. Patient denies fatigue, weight changes, heat/cold intolerance, bowel/skin changes or CVS symptoms  Lab Results  Component Value Date    TSH 0.732 04/17/2015  .  BMI is Body mass index is 39.69 kg/(m^2)., she is working on diet and exercise and has done well. She is on contrave and doing well, has some dry mouth and constipation. On colace.  Wt Readings from Last 3 Encounters:  05/23/15 261 lb (118.389 kg)  04/17/15 264 lb (119.75 kg)  02/21/15 277 lb 6.4 oz (125.828 kg)    Current Medications:  Current Outpatient Prescriptions on File Prior to Visit  Medication Sig Dispense Refill  . Cholecalciferol (VITAMIN D PO) Take 5,000 Int'l Units by mouth daily.    . diazepam (VALIUM) 5 MG tablet 1/2-1 pill every 8 hours for dizziness/vertigo 60 tablet 0  . hydrochlorothiazide (HYDRODIURIL) 25 MG tablet TAKE ONE TABLET BY MOUTH ONCE DAILY 30 tablet 1  . levothyroxine (SYNTHROID, LEVOTHROID) 150 MCG tablet Take 1 tablet (150 mcg total) by mouth daily before breakfast. 90 tablet 1  . Naltrexone-Bupropion HCl ER (CONTRAVE) 8-90 MG TB12 1 pill a morning for 1 week, then 1 pill twice a day for 1 week, 2 pills in the morning and one at night for 1 week, then 2 pill twice a day for 1 week and stay on this dose. 120 tablet 2  . Omega-3 Fatty Acids (FISH OIL PO) Take 2,000 mg by mouth daily.    Marland Kitchen OVER THE COUNTER MEDICATION 400 mg daily. Bio Cocumin    . Glucosamine-Chondroit-Vit C-Mn (GLUCOSAMINE CHONDR 1500 COMPLX PO) Take by mouth daily.     No current facility-administered medications on file prior to  visit.   Medical History:  Past Medical History  Diagnosis Date  . Thyroid disease    Allergies:  Allergies  Allergen Reactions  . Augmentin [Amoxicillin-Pot Clavulanate] Nausea And Vomiting    Review of Systems  Constitutional: Negative.   HENT: Negative.   Eyes: Negative.   Respiratory: Negative.   Cardiovascular: Negative.   Gastrointestinal: Positive for constipation. Negative for abdominal pain, blood in stool and melena.  Genitourinary: Negative.   Musculoskeletal: Positive for joint pain (right knee worse than left).  Negative for myalgias, back pain, falls and neck pain.  Skin: Negative.   Neurological: Negative.   Psychiatric/Behavioral: Negative.     Family history- Review and unchanged Social history- Review and unchanged Physical Exam: BP 126/76 mmHg  Pulse 73  Temp(Src) 97 F (36.1 C) (Temporal)  Resp 16  Ht 5\' 8"  (1.727 m)  Wt 261 lb (118.389 kg)  BMI 39.69 kg/m2  SpO2 98%  [name] 05/17/2015 Wt Readings from Last 3 Encounters:  05/23/15 261 lb (118.389 kg)  04/17/15 264 lb (119.75 kg)  02/21/15 277 lb 6.4 oz (125.828 kg)   General Appearance: Well nourished, in no apparent distress. Eyes: PERRLA, EOMs, conjunctiva no swelling or erythema Sinuses: No Frontal/maxillary tenderness ENT/Mouth: Ext aud canals clear, TMs without erythema, bulging. No erythema, swelling, or exudate on post pharynx.  Tonsils not swollen or erythematous. Hearing normal.  Neck: Supple, thyroid normal.  Respiratory: Respiratory effort normal, BS equal bilaterally without rales, rhonchi, wheezing or stridor.  Cardio: RRR with no MRGs. Brisk peripheral pulses without edema.  Abdomen: Soft, + BS.  Non tender, no guarding, rebound, hernias, masses. Lymphatics: Non tender without lymphadenopathy.  Musculoskeletal: Full ROM, 5/5 strength, normal gait.  Skin: Warm, dry without rashes, lesions, ecchymosis.  Neuro: Cranial nerves intact. Normal muscle tone, no cerebellar symptoms. Sensation intact.  Psych: Awake and oriented X 3, normal affect, Insight and Judgment appropriate.    Vicie Mutters, PA-C 11:45 AM [facility]

## 2015-05-23 NOTE — Patient Instructions (Signed)
We want weight loss that will last so you should lose 1-2 pounds a week.  THAT IS IT! Please pick THREE things a month to change. Once it is a habit check off the item. Then pick another three items off the list to become habits.  If you are already doing a habit on the list GREAT!  Cross that item off! o Don't drink your calories. Ie, alcohol, soda, fruit juice, and sweet tea.  o Drink more water. Drink a glass when you feel hungry or before each meal.  o Eat breakfast - Complex carb and protein (likeDannon light and fit yogurt, oatmeal, fruit, eggs, turkey bacon). o Measure your cereal.  Eat no more than one cup a day. (ie Kashi) o Eat an apple a day. o Add a vegetable a day. o Try a new vegetable a month. o Use Pam! Stop using oil or butter to cook. o Don't finish your plate or use smaller plates. o Share your dessert. o Eat sugar free Jello for dessert or frozen grapes. o Don't eat 2-3 hours before bed. o Switch to whole wheat bread, pasta, and brown rice. o Make healthier choices when you eat out. No fries! o Pick baked chicken, NOT fried. o Don't forget to SLOW DOWN when you eat. It is not going anywhere.  o Take the stairs. o Park far away in the parking lot o Lift soup cans (or weights) for 10 minutes while watching TV. o Walk at work for 10 minutes during break. o Walk outside 1 time a week with your friend, kids, dog, or significant other. o Start a walking group at church. o Walk the mall as much as you can tolerate.  o Keep a food diary. o Weigh yourself daily. o Walk for 15 minutes 3 days per week. o Cook at home more often and eat out less.  If life happens and you go back to old habits, it is okay.  Just start over. You can do it!   If you experience chest pain, get short of breath, or tired during the exercise, please stop immediately and inform your doctor.   Ways to cut 100 calories  1. Eat your eggs with hot sauce OR salsa instead of cheese.  Eggs are great for  breakfast, but many people consider eggs and cheese to be BFFs. Instead of cheese-1 oz. of cheddar has 114 calories-top your eggs with hot sauce, which contains no calories and helps with satiety and metabolism. Salsa is also a great option!!  2. Top your toast, waffles or pancakes with mashed berries instead of jelly or syrup. Half a cup of berries-fresh, frozen or thawed-has about 40 calories, compared with 2 tbsp. of maple syrup or jelly, which both have about 100 calories. The berries will also give you a good punch of fiber, which helps keep you full and satisfied and won't spike blood sugar quickly like the jelly or syrup. 3. Swap the non-fat latte for black coffee with a splash of half-and-half. Contrary to its name, that non-fat latte has 130 calories and a startling 19g of carbohydrates per 16 oz. serving. Replacing that 'light' drinkable dessert with a black coffee with a splash of half-and-half saves you more than 100 calories per 16 oz. serving. 4. Sprinkle salads with freeze-dried raspberries instead of dried cranberries. If you want a sweet addition to your nutritious salad, stay away from dried cranberries. They have a whopping 130 calories per  cup and 30g carbohydrates. Instead, sprinkle   freeze-dried raspberries guilt-free and save more than 100 calories per  cup serving, adding 3g of belly-filling fiber. 5. Go for mustard in place of mayo on your sandwich. Mustard can add really nice flavor to any sandwich, and there are tons of varieties, from spicy to honey. A serving of mayo is 95 calories, versus 10 calories in a serving of mustard. 6. Choose a DIY salad dressing instead of the store-bought kind. Mix Dijon or whole grain mustard with low-fat Kefir or red wine vinegar and garlic. 7. Use hummus as a spread instead of a dip. Use hummus as a spread on a high-fiber cracker or tortilla with a sandwich and save on calories without sacrificing taste. 8. Pick just one salad  "accessory." Salad isn't automatically a calorie winner. It's easy to over-accessorize with toppings. Instead of topping your salad with nuts, avocado and cranberries (all three will clock in at 313 calories), just pick one. The next day, choose a different accessory, which will also keep your salad interesting. You don't wear all your jewelry every day, right? 9. Ditch the white pasta in favor of spaghetti squash. One cup of cooked spaghetti squash has about 40 calories, compared with traditional spaghetti, which comes with more than 200. Spaghetti squash is also nutrient-dense. It's a good source of fiber and Vitamins A and C, and it can be eaten just like you would eat pasta-with a great tomato sauce and turkey meatballs or with pesto, tofu and spinach, for example. 10. Dress up your chili, soups and stews with non-fat Greek yogurt instead of sour cream. Just a 'dollop' of sour cream can set you back 115 calories and a whopping 12g of fat-seven of which are of the artery-clogging variety. Added bonus: Greek yogurt is packed with muscle-building protein, calcium and B Vitamins. 11. Mash cauliflower instead of mashed potatoes. One cup of traditional mashed potatoes-in all their creamy goodness-has more than 200 calories, compared to mashed cauliflower, which you can typically eat for less than 100 calories per 1 cup serving. Cauliflower is a great source of the antioxidant indole-3-carbinol (I3C), which may help reduce the risk of some cancers, like breast cancer. 12. Ditch the ice cream sundae in favor of a Greek yogurt parfait. Instead of a cup of ice cream or fro-yo for dessert, try 1 cup of nonfat Greek yogurt topped with fresh berries and a sprinkle of cacao nibs. Both toppings are packed with antioxidants, which can help reduce cellular inflammation and oxidative damage. And the comparison is a no-brainer: One cup of ice cream has about 275 calories; one cup of frozen yogurt has about 230; and a cup  of Greek yogurt has just 130, plus twice the protein, so you're less likely to return to the freezer for a second helping. 13. Put olive oil in a spray container instead of using it directly from the bottle. Each tablespoon of olive oil is 120 calories and 15g of fat. Use a mister instead of pouring it straight into the pan or onto a salad. This allows for portion control and will save you more than 100 calories. 14. When baking, substitute canned pumpkin for butter or oil. Canned pumpkin-not pumpkin pie mix-is loaded with Vitamin A, which is important for skin and eye health, as well as immunity. And the comparisons are pretty crazy:  cup of canned pumpkin has about 40 calories, compared to butter or oil, which has more than 800 calories. Yes, 800 calories. Applesauce and mashed banana can also serve as   good substitutions for butter or oil, usually in a 1:1 ratio. 15. Top casseroles with high-fiber cereal instead of breadcrumbs. Breadcrumbs are typically made with white bread, while breakfast cereals contain 5-9g of fiber per serving. Not only will you save more than 150 calories per  cup serving, the swap will also keep you more full and you'll get a metabolism boost from the added fiber. 16. Snack on pistachios instead of macadamia nuts. Believe it or not, you get the same amount of calories from 35 pistachios (100 calories) as you would from only five macadamia nuts. 17. Chow down on kale chips rather than potato chips. This is my favorite 'don't knock it 'till you try it' swap. Kale chips are so easy to make at home, and you can spice them up with a little grated parmesan or chili powder. Plus, they're a mere fraction of the calories of potato chips, but with the same crunch factor we crave so often. 18. Add seltzer and some fruit slices to your cocktail instead of soda or fruit juice. One cup of soda or fruit juice can pack on as much as 140 calories. Instead, use seltzer and fruit slices. The  fruit provides valuable phytochemicals, such as flavonoids and anthocyanins, which help to combat cancer and stave off the aging process.  Before you even begin to attack a weight-loss plan, it pays to remember this: You are not fat. You have fat. Losing weight isn't about blame or shame; it's simply another achievement to accomplish. Dieting is like any other skill-you have to buckle down and work at it. As long as you act in a smart, reasonable way, you'll ultimately get where you want to be. Here are some weight loss pearls for you.  1. It's Not a Diet. It's a Lifestyle Thinking of a diet as something you're on and suffering through only for the short term doesn't work. To shed weight and keep it off, you need to make permanent changes to the way you eat. It's OK to indulge occasionally, of course, but if you cut calories temporarily and then revert to your old way of eating, you'll gain back the weight quicker than you can say yo-yo. Use it to lose it. Research shows that one of the best predictors of long-term weight loss is how many pounds you drop in the first month. For that reason, nutritionists often suggest being stricter for the first two weeks of your new eating strategy to build momentum. Cut out added sugar and alcohol and avoid unrefined carbs. After that, figure out how you can reincorporate them in a way that's healthy and maintainable.  2. There's a Right Way to Exercise Working out burns calories and fat and boosts your metabolism by building muscle. But those trying to lose weight are notorious for overestimating the number of calories they burn and underestimating the amount they take in. Unfortunately, your system is biologically programmed to hold on to extra pounds and that means when you start exercising, your body senses the deficit and ramps up its hunger signals. If you're not diligent, you'll eat everything you burn and then some. Use it to lose it. Cardio gets all the exercise  glory, but strength and interval training are the real heroes. They help you build lean muscle, which in turn increases your metabolism and calorie-burning ability 3. Don't Overreact to Mild Hunger Some people have a hard time losing weight because of hunger anxiety. To them, being hungry is bad-something to be avoided  at all costs-so they carry snacks with them and eat when they don't need to. Others eat because they're stressed out or bored. While you never want to get to the point of being ravenous (that's when bingeing is likely to happen), a hunger pang, a craving, or the fact that it's 3:00 p.m. should not send you racing for the vending machine or obsessing about the energy bar in your purse. Ideally, you should put off eating until your stomach is growling and it's difficult to concentrate.  Use it to lose it. When you feel the urge to eat, use the HALT method. Ask yourself, Am I really hungry? Or am I angry or anxious, lonely or bored, or tired? If you're still not certain, try the apple test. If you're truly hungry, an apple should seem delicious; if it doesn't, something else is going on. Or you can try drinking water and making yourself busy, if you are still hungry try a healthy snack.  4. Not All Calories Are Created Equal The mechanics of weight loss are pretty simple: Take in fewer calories than you use for energy. But the kind of food you eat makes all the difference. Processed food that's high in saturated fat and refined starch or sugar can cause inflammation that disrupts the hormone signals that tell your brain you're full. The result: You eat a lot more.  Use it to lose it. Clean up your diet. Swap in whole, unprocessed foods, including vegetables, lean protein, and healthy fats that will fill you up and give you the biggest nutritional bang for your calorie buck. In a few weeks, as your brain starts receiving regular hunger and fullness signals once again, you'll notice that you feel  less hungry overall and naturally start cutting back on the amount you eat.  5. Protein, Produce, and Plant-Based Fats Are Your Weight-Loss Trinity Here's why eating the three Ps regularly will help you drop pounds. Protein fills you up. You need it to build lean muscle, which keeps your metabolism humming so that you can torch more fat. People in a weight-loss program who ate double the recommended daily allowance for protein (about 110 grams for a 150-pound woman) lost 70 percent of their weight from fat, while people who ate the RDA lost only about 40 percent, one study found. Produce is packed with filling fiber. "It's very difficult to consume too many calories if you're eating a lot of vegetables. Example: Three cups of broccoli is a lot of food, yet only 93 calories. (Fruit is another story. It can be easy to overeat and can contain a lot of calories from sugar, so be sure to monitor your intake.) Plant-based fats like olive oil and those in avocados and nuts are healthy and extra satiating.  Use it to lose it. Aim to incorporate each of the three Ps into every meal and snack. People who eat protein throughout the day are able to keep weight off, according to a study in the [place] of Clinical Nutrition. In addition to meat, poultry and seafood, good sources are beans, lentils, eggs, tofu, and yogurt. As for fat, keep portion sizes in check by measuring out salad dressing, oil, and nut butters (shoot for one to two tablespoons). Finally, eat veggies or a little fruit at every meal. People who did that consumed 308 fewer calories but didn't feel any hungrier than when they didn't eat more produce.  7. How You Eat Is As Important As What You Eat In  order for your brain to register that you're full, you need to focus on what you're eating. Sit down whenever you eat, preferably at a table. Turn off the TV or computer, put down your phone, and look at your food. Smell it. Chew slowly, and don't  put another bite on your fork until you swallow. When women ate lunch this attentively, they consumed 30 percent less when snacking later than those who listened to an audiobook at lunchtime, according to a study in the [place] of Nutrition. 8. Weighing Yourself Really Works The scale provides the best evidence about whether your efforts are paying off. Seeing the numbers tick up or down or stagnate is motivation to keep going-or to rethink your approach. A 2015 study at [facility] found that daily weigh-ins helped people lose more weight, keep it off, and maintain that loss, even after two years. Use it to lose it. Step on the scale at the same time every day for the best results. If your weight shoots up several pounds from one weigh-in to the next, don't freak out. Eating a lot of salt the night before or having your period is the likely culprit. The number should return to normal in a day or two. It's a steady climb that you need to do something about. 9. Too Much Stress and Too Little Sleep Are Your Enemies When you're tired and frazzled, your body cranks up the production of cortisol, the stress hormone that can cause carb cravings. Not getting enough sleep also boosts your levels of ghrelin, a hormone associated with hunger, while suppressing leptin, a hormone that signals fullness and satiety. People on a diet who slept only five and a half hours a night for two weeks lost 55 percent less fat and were hungrier than those who slept eight and a half hours, according to a study in the [place]. Use it to lose it. Prioritize sleep, aiming for seven hours or more a night, which research shows helps lower stress. And make sure you're getting quality zzz's. If a snoring spouse or a fidgety cat wakes you up frequently throughout the night, you may end up getting the equivalent of just four hours of sleep, according to a study from [facility]. Keep pets out  of the bedroom, and use a white-noise app to drown out snoring. 10. You Will Hit a plateau-And You Can Bust Through It As you slim down, your body releases much less leptin, the fullness hormone.  If you're not strength training, start right now. Building muscle can raise your metabolism to help you overcome a plateau. To keep your body challenged and burning calories, incorporate new moves and more intense intervals into your workouts or add another sweat session to your weekly routine. Alternatively, cut an extra 100 calories or so a day from your diet. Now that you've lost weight, your body simply doesn't need as much fuel.

## 2015-05-28 ENCOUNTER — Other Ambulatory Visit: Payer: Self-pay | Admitting: Internal Medicine

## 2015-05-28 ENCOUNTER — Other Ambulatory Visit: Payer: Self-pay | Admitting: Physician Assistant

## 2015-07-28 ENCOUNTER — Encounter: Payer: Self-pay | Admitting: *Deleted

## 2015-10-24 ENCOUNTER — Ambulatory Visit: Payer: Self-pay | Admitting: Physician Assistant

## 2015-11-23 ENCOUNTER — Encounter: Payer: Self-pay | Admitting: Physician Assistant

## 2016-01-22 ENCOUNTER — Other Ambulatory Visit: Payer: Self-pay | Admitting: Internal Medicine

## 2016-02-15 ENCOUNTER — Other Ambulatory Visit: Payer: Self-pay | Admitting: Physician Assistant

## 2016-02-15 DIAGNOSIS — E079 Disorder of thyroid, unspecified: Secondary | ICD-10-CM

## 2016-02-22 ENCOUNTER — Encounter: Payer: Self-pay | Admitting: Physician Assistant

## 2016-02-22 ENCOUNTER — Ambulatory Visit (INDEPENDENT_AMBULATORY_CARE_PROVIDER_SITE_OTHER): Payer: BLUE CROSS/BLUE SHIELD | Admitting: Physician Assistant

## 2016-02-22 VITALS — BP 128/82 | HR 71 | Temp 97.5°F | Resp 16 | Ht 68.0 in | Wt 270.0 lb

## 2016-02-22 DIAGNOSIS — R7309 Other abnormal glucose: Secondary | ICD-10-CM | POA: Insufficient documentation

## 2016-02-22 DIAGNOSIS — R32 Unspecified urinary incontinence: Secondary | ICD-10-CM

## 2016-02-22 DIAGNOSIS — Z0001 Encounter for general adult medical examination with abnormal findings: Secondary | ICD-10-CM | POA: Diagnosis not present

## 2016-02-22 DIAGNOSIS — R6889 Other general symptoms and signs: Secondary | ICD-10-CM | POA: Diagnosis not present

## 2016-02-22 DIAGNOSIS — Z136 Encounter for screening for cardiovascular disorders: Secondary | ICD-10-CM | POA: Diagnosis not present

## 2016-02-22 DIAGNOSIS — Z0000 Encounter for general adult medical examination without abnormal findings: Secondary | ICD-10-CM

## 2016-02-22 DIAGNOSIS — I10 Essential (primary) hypertension: Secondary | ICD-10-CM

## 2016-02-22 DIAGNOSIS — Z79899 Other long term (current) drug therapy: Secondary | ICD-10-CM

## 2016-02-22 DIAGNOSIS — E559 Vitamin D deficiency, unspecified: Secondary | ICD-10-CM | POA: Diagnosis not present

## 2016-02-22 DIAGNOSIS — E079 Disorder of thyroid, unspecified: Secondary | ICD-10-CM | POA: Diagnosis not present

## 2016-02-22 DIAGNOSIS — Z13220 Encounter for screening for lipoid disorders: Secondary | ICD-10-CM

## 2016-02-22 DIAGNOSIS — E669 Obesity, unspecified: Secondary | ICD-10-CM | POA: Diagnosis not present

## 2016-02-22 LAB — CBC WITH DIFFERENTIAL/PLATELET
BASOS ABS: 99 {cells}/uL (ref 0–200)
Basophils Relative: 1 %
EOS ABS: 297 {cells}/uL (ref 15–500)
Eosinophils Relative: 3 %
HCT: 40.4 % (ref 35.0–45.0)
Hemoglobin: 13.7 g/dL (ref 11.7–15.5)
LYMPHS ABS: 1980 {cells}/uL (ref 850–3900)
Lymphocytes Relative: 20 %
MCH: 28.8 pg (ref 27.0–33.0)
MCHC: 33.9 g/dL (ref 32.0–36.0)
MCV: 85.1 fL (ref 80.0–100.0)
MONO ABS: 594 {cells}/uL (ref 200–950)
MPV: 11.2 fL (ref 7.5–12.5)
Monocytes Relative: 6 %
NEUTROS ABS: 6930 {cells}/uL (ref 1500–7800)
Neutrophils Relative %: 70 %
PLATELETS: 258 10*3/uL (ref 140–400)
RBC: 4.75 MIL/uL (ref 3.80–5.10)
RDW: 15.1 % — AB (ref 11.0–15.0)
WBC: 9.9 10*3/uL (ref 3.8–10.8)

## 2016-02-22 MED ORDER — LEVOTHYROXINE SODIUM 150 MCG PO TABS: ORAL_TABLET | ORAL | 0 refills | Status: DC

## 2016-02-22 MED ORDER — BUPROPION HCL ER (XL) 150 MG PO TB24: 150.0000 mg | ORAL_TABLET | Freq: Every morning | ORAL | 1 refills | Status: DC

## 2016-02-22 MED ORDER — HYDROCHLOROTHIAZIDE 25 MG PO TABS: 25.0000 mg | ORAL_TABLET | Freq: Every day | ORAL | 1 refills | Status: DC

## 2016-02-22 MED ORDER — NYSTATIN 100000 UNIT/GM EX CREA: 1.0000 "application " | TOPICAL_CREAM | Freq: Two times a day (BID) | CUTANEOUS | 1 refills | Status: DC

## 2016-02-22 NOTE — Progress Notes (Signed)
Complete Physical  Assessment and Plan: Thyroid disease -     TSH -     levothyroxine (SYNTHROID, LEVOTHROID) 150 MCG tablet; TAKE ONE TABLET BY MOUTH ONCE DAILY BEFORE BREAKFAST  Obesity Obesity with co morbidities- long discussion about weight loss, diet, and exercise  Essential hypertension -     CBC with Differential/Platelet -     BASIC METABOLIC PANEL WITH GFR -     Hepatic function panel -     Urinalysis, Routine w reflex microscopic (not at [facility]) -     Microalbumin / creatinine urine ratio -     EKG 12-Lead  Other abnormal glucose -     Hemoglobin A1c -     Insulin, fasting  Vitamin D deficiency -     VITAMIN D 25 Hydroxy (Vit-D Deficiency, Fractures)  Medication management -     Magnesium  Routine general medical examination at a health care facility -     CBC with Differential/Platelet -     BASIC METABOLIC PANEL WITH GFR -     Hepatic function panel -     TSH -     Lipid panel -     Hemoglobin A1c -     Insulin, fasting -     Magnesium -     VITAMIN D 25 Hydroxy (Vit-D Deficiency, Fractures) -     Urinalysis, Routine w reflex microscopic (not at [facility]) -     Microalbumin / creatinine urine ratio  Screening cholesterol level -     Lipid panel  Urinary incontinence, unspecified incontinence type Declines pelvic floor PT at this time, advised weight loss, kegals discussed  Refill meds -     nystatin cream (MYCOSTATIN); Apply 1 application topically 2 (two) times daily. -     buPROPion (WELLBUTRIN XL) 150 MG 24 hr tablet; Take 1 tablet (150 mg total) by mouth every morning. -     hydrochlorothiazide (HYDRODIURIL) 25 MG tablet; Take 1 tablet (25 mg total) by mouth daily.    Discussed med's effects and SE's. Screening labs and tests as requested with regular follow-up as recommended.  HPI 55 y.o. female  presents for a complete physical.  Her blood pressure has been controlled at home, today their BP is BP: 128/82 She does not workout due to pain she is  limited but she is back to hiking. She denies chest pain, shortness of breath, dizziness.  She is not on cholesterol medication and denies myalgias. Her cholesterol is at goal. The cholesterol last visit was:   Lab Results  Component Value Date   CHOL 193 02/21/2015   HDL 51 02/21/2015   LDLCALC 117 02/21/2015   TRIG 125 02/21/2015   CHOLHDL 3.8 02/21/2015   Last A1C in the office was:  Lab Results  Component Value Date   HGBA1C 5.8 (H) 02/21/2015   Patient is on Vitamin D supplement.   Lab Results  Component Value Date   VD25OH 38 02/21/2015  Had right knee arthroscopy in Nov 2014, has bone on bone but does not want surgery, she is seeing chiropractor.  She is having regular periods but will go from light to heavy and starting to have some hot flashes.  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  Wt Readings from Last 3 Encounters:  02/22/16 270 lb (122.5 kg)  05/23/15 261 lb (118.4 kg)  04/17/15 264 lb (119.7 kg)   She is on thyroid medication. Her medication was not changed last visit. Patient denies heat / cold intolerance, nervousness and palpitations.  Lab Results  Component Value Date   TSH 0.732 04/17/2015  .  Current Medications:  Current Outpatient Prescriptions on File Prior to Visit  Medication Sig Dispense Refill  . buPROPion (WELLBUTRIN XL) 150 MG 24 hr tablet TAKE ONE TABLET BY MOUTH IN THE MORNING 90 tablet 1  . Cholecalciferol (VITAMIN D PO) Take 5,000 Int'l Units by mouth daily.    . diazepam (VALIUM) 5 MG tablet 1/2-1 pill every 8 hours for dizziness/vertigo 60 tablet 0  . hydrochlorothiazide (HYDRODIURIL) 25 MG  tablet TAKE ONE TABLET BY MOUTH ONCE DAILY 90 tablet 1  . levothyroxine (SYNTHROID, LEVOTHROID) 150 MCG tablet TAKE ONE TABLET BY MOUTH ONCE DAILY BEFORE BREAKFAST 90 tablet 0  . loratadine (CLARITIN) 10 MG tablet     . Omega-3 1000 MG CAPS Take 1,000 mg by mouth.    . Omega-3 Fatty Acids (FISH OIL PO) Take 2,000 mg by mouth daily.    Marland Kitchen OVER THE COUNTER MEDICATION 400 mg daily. Bio Cocumin    . [place] 10-3.5-12 MG-GM-GM PACK      No current facility-administered medications on file prior to visit.    Health Maintenance:   Immunization History  Administered Date(s) Administered  . Influenza Split 05/10/2014  . Td 06/11/2007   Tetanus: 2009 Pneumovax: Prevnar 13: Flu vaccine: 2015 declines Zostavax: Pap: June 2014 normal PAP would like to do every 5 years Patient's last menstrual period was 01/28/2016. MGM: 09/ 2016  CAT B DEXA: n/A Colonoscopy: [facility] in [place]- 2015 due in 5 years EGD:  Medical History:  Past Medical History:  Diagnosis Date  . Thyroid disease    Allergies Allergies  Allergen Reactions  . Augmentin [Amoxicillin-Pot Clavulanate] Nausea And Vomiting   SURGICAL HISTORY She  has no past surgical history on file. FAMILY HISTORY Her family history includes Alzheimer's disease (age of onset: 17) in her maternal grandfather; Alzheimer's disease (age of onset: 90) in her mother; Arthritis in her father; Cancer in her father; Depression in her brother; Gout in her father; Hypertension in her father and mother; Mental illness in her daughter. SOCIAL HISTORY She  reports that she has never smoked. She has never used smokeless tobacco. She reports that she does not drink alcohol or use drugs.  Review of Systems  Constitutional: Negative.   HENT: Negative.   Eyes: Negative.   Respiratory: Negative.   Cardiovascular: Negative.   Gastrointestinal: Positive for constipation (some rectal and urinary incontinence).  Genitourinary: Negative.   Musculoskeletal:  Positive for joint pain (right knee worse than left). Negative for back pain, falls, myalgias and neck pain.  Skin: Negative.   Neurological: Negative.   Psychiatric/Behavioral: Negative.     Physical Exam: Estimated body mass index is 41.05 kg/m as calculated from the following:   Height as of this encounter: 5\' 8"  (1.727 m).   Weight as of this encounter: 270 lb (122.5 kg). BP 128/82   Pulse 71   Temp 97.5 F (36.4 C)   Resp 16   Ht 5\' 8"  (1.727 m)   Wt 270 lb (122.5 kg)  [name] 01/28/2016   SpO2 98%   BMI 41.05 kg/m  General Appearance: Well nourished, in no apparent distress. Eyes: PERRLA, EOMs, conjunctiva no swelling or erythema, normal fundi and vessels. Sinuses: No Frontal/maxillary tenderness ENT/Mouth: Ext aud canals clear, normal light reflex with TMs without erythema, bulging.  Good dentition. No erythema, swelling, or exudate on post pharynx. Tonsils not swollen or erythematous. Hearing normal.  Neck: Supple, thyroid normal. No bruits Respiratory: Respiratory effort normal, BS equal bilaterally without rales, rhonchi, wheezing or stridor. Cardio: RRR without murmurs, rubs or gallops. Brisk peripheral pulses without edema.  Chest: symmetric, with normal excursions and percussion. Breasts: Symmetric, without lumps, nipple discharge, retractions. Abdomen: Soft, +BS. Non tender, no guarding, rebound, hernias, masses, or organomegaly. .  Lymphatics: Non tender without lymphadenopathy.  Genitourinary: defer Musculoskeletal: Full ROM all peripheral extremities,5/5 strength, and normal gait.  Skin: Warm, dry without rashes, lesions, ecchymosis.  Neuro: Cranial nerves intact, reflexes equal bilaterally. Normal muscle tone, no cerebellar symptoms. Sensation intact.  Psych: Awake and oriented X 3, normal affect, Insight and Judgment appropriate.   EKG: WNL AORTA SCAN: declines   Vicie Mutters 2:14 PM

## 2016-02-23 LAB — URINALYSIS, ROUTINE W REFLEX MICROSCOPIC
Bilirubin Urine: NEGATIVE
Glucose, UA: NEGATIVE
Hgb urine dipstick: NEGATIVE
KETONES UR: NEGATIVE
LEUKOCYTES UA: NEGATIVE
NITRITE: NEGATIVE
Protein, ur: NEGATIVE
SPECIFIC GRAVITY, URINE: 1.013 (ref 1.001–1.035)
pH: 6 (ref 5.0–8.0)

## 2016-02-23 LAB — HEPATIC FUNCTION PANEL
ALK PHOS: 77 U/L (ref 33–130)
ALT: 21 U/L (ref 6–29)
AST: 21 U/L (ref 10–35)
Albumin: 3.8 g/dL (ref 3.6–5.1)
BILIRUBIN DIRECT: 0.1 mg/dL (ref <=0.2)
BILIRUBIN INDIRECT: 0.3 mg/dL (ref 0.2–1.2)
Total Bilirubin: 0.4 mg/dL (ref 0.2–1.2)
Total Protein: 6.1 g/dL (ref 6.1–8.1)

## 2016-02-23 LAB — LIPID PANEL
CHOL/HDL RATIO: 4.1 ratio (ref <=5.0)
CHOLESTEROL: 179 mg/dL (ref 125–200)
HDL: 44 mg/dL — AB (ref >=46)
LDL Cholesterol: 105 mg/dL (ref <130)
TRIGLYCERIDES: 151 mg/dL — AB (ref <150)
VLDL: 30 mg/dL (ref <30)

## 2016-02-23 LAB — BASIC METABOLIC PANEL WITH GFR
BUN: 11 mg/dL (ref 7–25)
CALCIUM: 9 mg/dL (ref 8.6–10.4)
CO2: 16 mmol/L — ABNORMAL LOW (ref 20–31)
Chloride: 103 mmol/L (ref 98–110)
Creat: 0.88 mg/dL (ref 0.50–1.05)
GFR, EST AFRICAN AMERICAN: 87 mL/min (ref >=60)
GFR, EST NON AFRICAN AMERICAN: 75 mL/min (ref >=60)
Glucose, Bld: 121 mg/dL — ABNORMAL HIGH (ref 65–99)
POTASSIUM: 3.5 mmol/L (ref 3.5–5.3)
SODIUM: 137 mmol/L (ref 135–146)

## 2016-02-23 LAB — MAGNESIUM: Magnesium: 1.9 mg/dL (ref 1.5–2.5)

## 2016-02-23 LAB — MICROALBUMIN / CREATININE URINE RATIO
CREATININE, URINE: 85 mg/dL (ref 20–320)
MICROALB UR: 0.3 mg/dL
MICROALB/CREAT RATIO: 4 ug/mg{creat} (ref <30)

## 2016-02-23 LAB — TSH: TSH: 0.37 mIU/L — ABNORMAL LOW

## 2016-02-23 LAB — HEMOGLOBIN A1C
Hgb A1c MFr Bld: 5.4 % (ref <5.7)
Mean Plasma Glucose: 108 mg/dL

## 2016-02-23 LAB — VITAMIN D 25 HYDROXY (VIT D DEFICIENCY, FRACTURES): Vit D, 25-Hydroxy: 59 ng/mL (ref 30–100)

## 2016-02-23 LAB — INSULIN, FASTING: Insulin fasting, serum: 77.4 u[IU]/mL — ABNORMAL HIGH (ref 2.0–19.6)

## 2016-03-18 ENCOUNTER — Encounter: Payer: Self-pay | Admitting: Physician Assistant

## 2016-06-18 ENCOUNTER — Encounter: Payer: Self-pay | Admitting: Physician Assistant

## 2016-07-05 DIAGNOSIS — Z1231 Encounter for screening mammogram for malignant neoplasm of breast: Secondary | ICD-10-CM | POA: Diagnosis not present

## 2016-08-22 ENCOUNTER — Ambulatory Visit (INDEPENDENT_AMBULATORY_CARE_PROVIDER_SITE_OTHER): Payer: 59 | Admitting: Physician Assistant

## 2016-08-22 ENCOUNTER — Encounter: Payer: Self-pay | Admitting: Physician Assistant

## 2016-08-22 VITALS — BP 126/84 | HR 74 | Temp 97.9°F | Resp 16 | Ht 68.0 in | Wt 267.0 lb

## 2016-08-22 DIAGNOSIS — I10 Essential (primary) hypertension: Secondary | ICD-10-CM | POA: Diagnosis not present

## 2016-08-22 DIAGNOSIS — Z79899 Other long term (current) drug therapy: Secondary | ICD-10-CM | POA: Diagnosis not present

## 2016-08-22 DIAGNOSIS — Z6841 Body Mass Index (BMI) 40.0 and over, adult: Secondary | ICD-10-CM

## 2016-08-22 DIAGNOSIS — E785 Hyperlipidemia, unspecified: Secondary | ICD-10-CM

## 2016-08-22 DIAGNOSIS — E6609 Other obesity due to excess calories: Secondary | ICD-10-CM

## 2016-08-22 DIAGNOSIS — E079 Disorder of thyroid, unspecified: Secondary | ICD-10-CM

## 2016-08-22 DIAGNOSIS — E559 Vitamin D deficiency, unspecified: Secondary | ICD-10-CM

## 2016-08-22 DIAGNOSIS — R7309 Other abnormal glucose: Secondary | ICD-10-CM | POA: Diagnosis not present

## 2016-08-22 DIAGNOSIS — IMO0001 Reserved for inherently not codable concepts without codable children: Secondary | ICD-10-CM

## 2016-08-22 LAB — CBC WITH DIFFERENTIAL/PLATELET
BASOS PCT: 1 %
Basophils Absolute: 56 cells/uL (ref 0–200)
EOS ABS: 224 {cells}/uL (ref 15–500)
Eosinophils Relative: 4 %
HEMATOCRIT: 39.8 % (ref 35.0–45.0)
HEMOGLOBIN: 13.3 g/dL (ref 11.7–15.5)
LYMPHS ABS: 1232 {cells}/uL (ref 850–3900)
Lymphocytes Relative: 22 %
MCH: 28.6 pg (ref 27.0–33.0)
MCHC: 33.4 g/dL (ref 32.0–36.0)
MCV: 85.6 fL (ref 80.0–100.0)
MONO ABS: 504 {cells}/uL (ref 200–950)
MONOS PCT: 9 %
MPV: 10.4 fL (ref 7.5–12.5)
NEUTROS ABS: 3584 {cells}/uL (ref 1500–7800)
Neutrophils Relative %: 64 %
PLATELETS: 242 10*3/uL (ref 140–400)
RBC: 4.65 MIL/uL (ref 3.80–5.10)
RDW: 15.4 % — ABNORMAL HIGH (ref 11.0–15.0)
WBC: 5.6 10*3/uL (ref 3.8–10.8)

## 2016-08-22 LAB — LIPID PANEL
CHOL/HDL RATIO: 3.6 ratio (ref <5.0)
Cholesterol: 152 mg/dL (ref <200)
HDL: 42 mg/dL — AB (ref >50)
LDL CALC: 83 mg/dL (ref <100)
Triglycerides: 137 mg/dL (ref <150)
VLDL: 27 mg/dL (ref <30)

## 2016-08-22 LAB — BASIC METABOLIC PANEL WITH GFR
BUN: 9 mg/dL (ref 7–25)
CHLORIDE: 104 mmol/L (ref 98–110)
CO2: 25 mmol/L (ref 20–31)
Calcium: 9.4 mg/dL (ref 8.6–10.4)
Creat: 0.82 mg/dL (ref 0.50–1.05)
GFR, Est African American: >89 mL/min (ref >=60)
GFR, Est Non African American: 81 mL/min (ref >=60)
Glucose, Bld: 91 mg/dL (ref 65–99)
POTASSIUM: 3.8 mmol/L (ref 3.5–5.3)
Sodium: 140 mmol/L (ref 135–146)

## 2016-08-22 LAB — HEPATIC FUNCTION PANEL
ALBUMIN: 3.9 g/dL (ref 3.6–5.1)
ALK PHOS: 78 U/L (ref 33–130)
ALT: 38 U/L — ABNORMAL HIGH (ref 6–29)
AST: 27 U/L (ref 10–35)
BILIRUBIN TOTAL: 0.5 mg/dL (ref 0.2–1.2)
Bilirubin, Direct: 0.1 mg/dL (ref <=0.2)
Indirect Bilirubin: 0.4 mg/dL (ref 0.2–1.2)
Total Protein: 6.1 g/dL (ref 6.1–8.1)

## 2016-08-22 LAB — TSH: TSH: 1.46 mIU/L

## 2016-08-22 MED ORDER — BUPROPION HCL ER (XL) 150 MG PO TB24: 150.0000 mg | ORAL_TABLET | Freq: Every morning | ORAL | 1 refills | Status: DC

## 2016-08-22 MED ORDER — HYDROCHLOROTHIAZIDE 25 MG PO TABS: 25.0000 mg | ORAL_TABLET | Freq: Every day | ORAL | 1 refills | Status: DC

## 2016-08-22 MED ORDER — LEVOTHYROXINE SODIUM 150 MCG PO TABS: ORAL_TABLET | ORAL | 0 refills | Status: DC

## 2016-08-22 NOTE — Progress Notes (Signed)
Assessment and Plan:  1. Thyroid disease Hypothyroidism-check TSH level, continue medications the same, reminded to take on an empty stomach 30-36mins before food.  - TSH - levothyroxine (SYNTHROID, LEVOTHROID) 150 MCG tablet; TAKE ONE TABLET BY MOUTH ONCE DAILY BEFORE BREAKFAST  Dispense: 90 tablet; Refill: 0  2. Other abnormal glucose Discussed general issues about diabetes pathophysiology and management., Educational material distributed., Suggested low cholesterol diet., Encouraged aerobic exercise., Discussed foot care., Reminded to get yearly retinal exam. - Hemoglobin A1c  3. Class 3 obesity due to excess calories with serious comorbidity and body mass index (BMI) of 40.0 to 44.9 in adult [facility] - long discussion about weight loss, diet, and exercise - buPROPion (WELLBUTRIN XL) 150 MG 24 hr tablet; Take 1 tablet (150 mg total) by mouth every morning.  Dispense: 90 tablet; Refill: 1  4. Essential hypertension Start every other day, check labs - CBC with Differential/Platelet - BASIC METABOLIC PANEL WITH GFR - Hepatic function panel - TSH - hydrochlorothiazide (HYDRODIURIL) 25 MG tablet; Take 1 tablet (25 mg total) by mouth daily.  Dispense: 90 tablet; Refill: 1  5. Vitamin D deficiency Continue supplement  6. Medication management - Magnesium  7. Hyperlipidemia, unspecified hyperlipidemia type -continue medications, check lipids, decrease fatty foods, increase activity.  - Lipid panel   Future Appointments Date Time Provider [place]  03/13/2017 2:00 PM Vicie Mutters, PA-C [facility] None    HPI 56 y.o. female  presents for 6 month follow up with hypertension, hyperlipidemia, prediabetes and vitamin D. Her blood pressure has not been controlled at home, today their BP is BP: 126/84  She does workout, has joined [employer]  She denies chest pain, shortness of breath, dizziness.  She is not on cholesterol medication and denies myalgias. Her cholesterol is at  goal. The cholesterol last visit was:   Lab Results  Component Value Date   CHOL 179 02/22/2016   HDL 44 (L) 02/22/2016   LDLCALC 105 02/22/2016   TRIG 151 (H) 02/22/2016   CHOLHDL 4.1 02/22/2016    Last A1C in the office was normal but her insulin was elevated:  Lab Results  Component Value Date   HGBA1C 5.4 02/22/2016   Patient is on Vitamin D supplement.   Lab Results  Component Value Date   VD25OH 49 02/22/2016     She is on thyroid medication. Her medication was not changed last visit, she is on 1 pill daily but 1/2 on T,T. Patient denies fatigue, weight changes, heat/cold intolerance, bowel/skin changes or CVS symptoms  Lab Results  Component Value Date   TSH 0.37 (L) 02/22/2016  .  BMI is Body mass index is 40.6 kg/m., she is working on diet and exercise and has done well. She is on contrave and doing well, has some dry mouth and constipation. On colace.  Wt Readings from Last 3 Encounters:  08/22/16 267 lb (121.1 kg)  02/22/16 270 lb (122.5 kg)  05/23/15 261 lb (118.4 kg)    Current Medications:  Current Outpatient Prescriptions on File Prior to Visit  Medication Sig Dispense Refill  . buPROPion (WELLBUTRIN XL) 150 MG 24 hr tablet Take 1 tablet (150 mg total) by mouth every morning. 90 tablet 1  . Cholecalciferol (VITAMIN D PO) Take 5,000 Int'l Units by mouth daily.    . diazepam (VALIUM) 5 MG tablet 1/2-1 pill every 8 hours for dizziness/vertigo 60 tablet 0  . hydrochlorothiazide (HYDRODIURIL) 25 MG tablet Take 1 tablet (25 mg total) by mouth daily. [place]  tablet 1  . levothyroxine (SYNTHROID, LEVOTHROID) 150 MCG tablet TAKE ONE TABLET BY MOUTH ONCE DAILY BEFORE BREAKFAST 90 tablet 0  . loratadine (CLARITIN) 10 MG tablet     . nystatin cream (MYCOSTATIN) Apply 1 application topically 2 (two) times daily. 30 g 1  . Omega-3 1000 MG CAPS Take 1,000 mg by mouth.    . Omega-3 Fatty Acids (FISH OIL PO) Take 2,000 mg by mouth daily.    Marland Kitchen OVER THE COUNTER MEDICATION 400 mg  daily. Bio Cocumin    . [place] 10-3.5-12 MG-GM-GM PACK      No current facility-administered medications on file prior to visit.    Medical History:  Past Medical History:  Diagnosis Date  . Thyroid disease    Allergies:  Allergies  Allergen Reactions  . Augmentin [Amoxicillin-Pot Clavulanate] Nausea And Vomiting    Review of Systems  Constitutional: Negative.   HENT: Negative.   Eyes: Negative.   Respiratory: Negative.   Cardiovascular: Negative.   Gastrointestinal: Positive for constipation. Negative for abdominal pain, blood in stool and melena.  Genitourinary: Negative.   Musculoskeletal: Positive for joint pain (right knee worse than left). Negative for back pain, falls, myalgias and neck pain.  Skin: Negative.   Neurological: Negative.   Psychiatric/Behavioral: Negative.     Family history- Review and unchanged Social history- Review and unchanged Physical Exam: BP 126/84   Pulse 74   Temp 97.9 F (36.6 C)   Resp 16   Ht 5\' 8"  (1.727 m)   Wt 267 lb (121.1 kg)   [name] 08/02/2016   SpO2 97%   BMI 40.60 kg/m  Wt Readings from Last 3 Encounters:  08/22/16 267 lb (121.1 kg)  02/22/16 270 lb (122.5 kg)  05/23/15 261 lb (118.4 kg)   General Appearance: Well nourished, in no apparent distress. Eyes: PERRLA, EOMs, conjunctiva no swelling or erythema Sinuses: No Frontal/maxillary tenderness ENT/Mouth: Ext aud canals clear, TMs without erythema, bulging. No erythema, swelling, or exudate on post pharynx.  Tonsils not swollen or erythematous. Hearing normal.  Neck: Supple, thyroid normal.  Respiratory: Respiratory effort normal, BS equal bilaterally without rales, rhonchi, wheezing or stridor.  Cardio: RRR with no MRGs. Brisk peripheral pulses without edema.  Abdomen: Soft, + BS.  Non tender, no guarding, rebound, hernias, masses. Lymphatics: Non tender without lymphadenopathy.  Musculoskeletal: Full ROM, 5/5 strength, normal gait.  Skin: Warm, dry without rashes,  lesions, ecchymosis.  Neuro: Cranial nerves intact. Normal muscle tone, no cerebellar symptoms. Sensation intact.  Psych: Awake and oriented X 3, normal affect, Insight and Judgment appropriate.    Vicie Mutters, PA-C 8:55 AM [facility]

## 2016-08-22 NOTE — Patient Instructions (Addendum)
Simple math prevails.    1st - exercise does not produce significant weight loss - at best one converts fat into muscle , "bulks up", loses inches, but usually stays "weight neutral"     2nd - think of your body weightas a check book: If you eat more calories than you burn up - you save money or gain weight .... Or if you spend more money than you put in the check book, ie burn up more calories than you eat, then you lose weight     3rd - if you walk or run 1 mile, you burn up 100 calories - you have to burn up 3,500 calories to lose 1 pound, ie you have to walk/run 35 miles to lose 1 measly pound. So if you want to lose 10 #, then you have to walk/run 350 miles, so.... clearly exercise is not the solution.     4. So if you consume 1,500 calories, then you have to burn up the equivalent of 15 miles to stay weight neutral - It also stands to reason that if you consume 1,500 cal/day and don't lose weight, then you must be burning up about 1,500 cals/day to stay weight neutral.     5. If you really want to lose weight, you must cut your calorie intake 300 calories /day and at that rate you should lose about 1 # every 3 days.   6. Please purchase Dr Joel Fuhrman's book(s) "The End of Dieting" & "Eat to Live" . It has some great concepts and recipes.        Bad carbs also include fruit juice, alcohol, and sweet tea. These are empty calories that do not signal to your brain that you are full.   Please remember the good carbs are still carbs which convert into sugar. So please measure them out no more than 1/2-1 cup of rice, oatmeal, pasta, and beans  Veggies are however free foods! Pile them on.   Not all fruit is created equal. Please see the list below, the fruit at the bottom is higher in sugars than the fruit at the top. Please avoid all dried fruits.     

## 2016-08-23 ENCOUNTER — Encounter: Payer: Self-pay | Admitting: Physician Assistant

## 2016-08-23 LAB — HEMOGLOBIN A1C
HEMOGLOBIN A1C: 5.2 % (ref <5.7)
Mean Plasma Glucose: 103 mg/dL

## 2016-08-23 LAB — MAGNESIUM: Magnesium: 2 mg/dL (ref 1.5–2.5)

## 2016-09-11 ENCOUNTER — Encounter: Payer: Self-pay | Admitting: *Deleted

## 2016-11-14 DIAGNOSIS — H16142 Punctate keratitis, left eye: Secondary | ICD-10-CM | POA: Diagnosis not present

## 2016-11-14 DIAGNOSIS — H02831 Dermatochalasis of right upper eyelid: Secondary | ICD-10-CM | POA: Diagnosis not present

## 2016-11-14 DIAGNOSIS — H02834 Dermatochalasis of left upper eyelid: Secondary | ICD-10-CM | POA: Diagnosis not present

## 2016-12-27 ENCOUNTER — Other Ambulatory Visit: Payer: Self-pay | Admitting: Physician Assistant

## 2016-12-27 DIAGNOSIS — E079 Disorder of thyroid, unspecified: Secondary | ICD-10-CM

## 2017-03-13 ENCOUNTER — Encounter: Payer: Self-pay | Admitting: Physician Assistant

## 2017-04-10 ENCOUNTER — Other Ambulatory Visit: Payer: Self-pay | Admitting: Physician Assistant

## 2017-04-10 DIAGNOSIS — E079 Disorder of thyroid, unspecified: Secondary | ICD-10-CM

## 2017-04-27 DIAGNOSIS — J209 Acute bronchitis, unspecified: Secondary | ICD-10-CM | POA: Diagnosis not present

## 2017-05-05 NOTE — Progress Notes (Signed)
Complete Physical  Assessment and Plan: Thyroid disease Hypothyroidism-check TSH level, continue medications the same, reminded to take on an empty stomach 30-63mins before food.  -     TSH -     levothyroxine (SYNTHROID, LEVOTHROID) 150 MCG tablet; TAKE ONE TABLET BY MOUTH ONCE DAILY BEFORE BREAKFAST  Obesity Obesity with co morbidities- long discussion about weight loss, diet, and exercise  Essential hypertension - continue medications, DASH diet, exercise and monitor at home. Call if greater than 130/80.  -     CBC with Differential/Platelet -     BASIC METABOLIC PANEL WITH GFR -     Hepatic function panel -     Urinalysis, Routine w reflex microscopic (not at [facility]) -     Microalbumin / creatinine urine ratio -     EKG 12-Lead  Vitamin D deficiency -     VITAMIN D 25 Hydroxy (Vit-D Deficiency, Fractures)  Medication management -     Magnesium  Other abnormal glucose Discussed disease progression and risks Discussed diet/exercise, weight management and risk modification -     Hemoglobin A1c  Hyperlipidemia, unspecified hyperlipidemia type -continue medications, check lipids, decrease fatty foods, increase activity.  -     Lipid panel  Routine general medical examination at a health care facility 1 year  BMI 40.0-44.9, adult ([place]) - follow up 3 months for progress monitoring - increase veggies, decrease carbs - long discussion about weight loss, diet, and exercise  Depression, major, recurrent, in partial remission (HCC) -     buPROPion (WELLBUTRIN XL) 150 MG 24 hr tablet; Take 2 tablets (300 mg total) by mouth daily. -     ALPRAZolam (XANAX) 0.5 MG tablet; Take 1 tablet (0.5 mg total) by mouth 3 (three) times daily as needed for anxiety (vertigo).    Discussed med's effects and SE's. Screening labs and tests as requested with regular follow-up as recommended. Future Appointments  Date Time Provider [place]  05/12/2018 10:00 AM Vicie Mutters, PA-C  [facility] None     HPI 56 y.o. female  presents for a complete physical. Mom passed in Sept, just finished zpak for bronchitis.  She states her menses are becoming irregular, had August 16, Oct 4th and Oct 29th.   Her blood pressure has been controlled at home, today their BP is BP: 126/90 She does workout, she is doing group exercise at [employer].  She denies chest pain, shortness of breath, dizziness.  She is not on cholesterol medication and denies myalgias. Her cholesterol is at goal. The cholesterol last visit was:   Lab Results  Component Value Date   CHOL 152 08/22/2016   HDL 42 (L) 08/22/2016   LDLCALC 83 08/22/2016   TRIG 137 08/22/2016   CHOLHDL 3.6 08/22/2016   Last A1C in the office was:  Lab Results  Component Value Date   HGBA1C 5.2 08/22/2016   Patient is on Vitamin D supplement.   Lab Results  Component Value Date   VD25OH 55 02/22/2016   She was unable to tolerate phentermine, has been on contrave but stopped due to constipation. She is on wellbutrin everyday to help with weight.   Wt Readings from Last 3 Encounters:  05/06/17 262 lb 3.2 oz (118.9 kg)  08/22/16 267 lb (121.1 kg)  02/22/16 270 lb (122.5 kg)   She is on thyroid medication. Her medication was not changed last visit. Patient denies heat / cold intolerance, nervousness and palpitations.  Lab Results  Component Value Date  TSH 1.46 08/22/2016  .  Current Medications:  Current Outpatient Medications on File Prior to Visit  Medication Sig Dispense Refill  . Cholecalciferol (VITAMIN D PO) Take 5,000 Int'l Units by mouth daily.    . diazepam (VALIUM) 5 MG tablet 1/2-1 pill every 8 hours for dizziness/vertigo 60 tablet 0  . levothyroxine (SYNTHROID, LEVOTHROID) 150 MCG tablet TAKE 1 TABLET BY MOUTH ONCE DAILY BEFORE  BREAKFAST 90 tablet 1  . Omega-3 Fatty Acids (FISH OIL PO) Take 2,000 mg by mouth daily.    Marland Kitchen OVER THE COUNTER MEDICATION 400 mg daily. Bio Cocumin    . [place] 10-3.5-12  MG-GM-GM PACK      No current facility-administered medications on file prior to visit.    Health Maintenance:   Immunization History  Administered Date(s) Administered  . Influenza Split 05/10/2014  . Td 06/11/2007   Tetanus: 2009 DUE Pneumovax: Prevnar 13: Flu vaccine: 2015 declines Zostavax: Pap: June 2014 normal PAP would like to do every 5 years No [name] recorded. MGM: 07/05/2016  CAT B DEXA: n/A Colonoscopy: [facility] in [place]- 2015 due in 5 years EGD:  Medical History:  Past Medical History:  Diagnosis Date  . Thyroid disease    Allergies Allergies  Allergen Reactions  . Augmentin [Amoxicillin-Pot Clavulanate] Nausea And Vomiting   SURGICAL HISTORY She  has a past surgical history that includes Knee arthroscopy (Right, 2015). FAMILY HISTORY Her family history includes Alzheimer's disease (age of onset: 47) in her maternal grandfather; Alzheimer's disease (age of onset: 61) in her mother; Arthritis in her father; Cancer in her father; Depression in her brother; Gout in her father; Hypertension in her father and mother; Mental illness in her daughter. SOCIAL HISTORY She  reports that  has never smoked. she has never used smokeless tobacco. She reports that she does not drink alcohol or use drugs.  Review of Systems  Constitutional: Negative.   HENT: Negative.   Eyes: Negative.   Respiratory: Negative.   Cardiovascular: Negative.   Gastrointestinal: Positive for constipation (some rectal and urinary incontinence).  Genitourinary: Negative.   Musculoskeletal: Positive for joint pain (right knee worse than left). Negative for back pain, falls, myalgias and neck pain.  Skin: Negative.   Neurological: Negative.   Psychiatric/Behavioral: Negative.     Physical Exam: Estimated body mass index is 40.46 kg/m as calculated from the following:   Height as of this encounter: 5' 7.5" (1.715 m).   Weight as of this encounter: 262 lb 3.2 oz (118.9 kg). BP 126/90   Pulse  74   Temp (!) 97.3 F (36.3 C)   Resp 18   Ht 5' 7.5" (1.715 m)   Wt 262 lb 3.2 oz (118.9 kg)   SpO2 97%   BMI 40.46 kg/m  General Appearance: Well nourished, in no apparent distress. Eyes: PERRLA, EOMs, conjunctiva no swelling or erythema, normal fundi and vessels. Sinuses: No Frontal/maxillary tenderness ENT/Mouth: Ext aud canals clear, normal light reflex with TMs without erythema, bulging.  Good dentition. No erythema, swelling, or exudate on post pharynx. Tonsils not swollen or erythematous. Hearing normal.  Neck: Supple, thyroid normal. No bruits Respiratory: Respiratory effort normal, BS equal bilaterally without rales, rhonchi, wheezing or stridor. Cardio: RRR without murmurs, rubs or gallops. Brisk peripheral pulses without edema.  Chest: symmetric, with normal excursions and percussion. Breasts: Symmetric, without lumps, nipple discharge, retractions. Abdomen: Soft, +BS. Non tender, no guarding, rebound, hernias, masses, or organomegaly. .  Lymphatics: Non tender without lymphadenopathy.  Genitourinary: defer Musculoskeletal: Full  ROM all peripheral extremities,5/5 strength, and normal gait.  Skin: Warm, dry without rashes, lesions, ecchymosis.  Neuro: Cranial nerves intact, reflexes equal bilaterally. Normal muscle tone, no cerebellar symptoms. Sensation intact.  Psych: Awake and oriented X 3, normal affect, Insight and Judgment appropriate.   EKG: WNL AORTA SCAN: declines   Vicie Mutters 10:53 AM

## 2017-05-06 ENCOUNTER — Encounter: Payer: Self-pay | Admitting: Physician Assistant

## 2017-05-06 ENCOUNTER — Ambulatory Visit (INDEPENDENT_AMBULATORY_CARE_PROVIDER_SITE_OTHER): Payer: 59 | Admitting: Physician Assistant

## 2017-05-06 VITALS — BP 126/90 | HR 74 | Temp 97.3°F | Resp 18 | Ht 67.5 in | Wt 262.2 lb

## 2017-05-06 DIAGNOSIS — Z79899 Other long term (current) drug therapy: Secondary | ICD-10-CM

## 2017-05-06 DIAGNOSIS — Z136 Encounter for screening for cardiovascular disorders: Secondary | ICD-10-CM

## 2017-05-06 DIAGNOSIS — E079 Disorder of thyroid, unspecified: Secondary | ICD-10-CM

## 2017-05-06 DIAGNOSIS — E785 Hyperlipidemia, unspecified: Secondary | ICD-10-CM

## 2017-05-06 DIAGNOSIS — I10 Essential (primary) hypertension: Secondary | ICD-10-CM | POA: Diagnosis not present

## 2017-05-06 DIAGNOSIS — Z0000 Encounter for general adult medical examination without abnormal findings: Secondary | ICD-10-CM

## 2017-05-06 DIAGNOSIS — Z6841 Body Mass Index (BMI) 40.0 and over, adult: Secondary | ICD-10-CM

## 2017-05-06 DIAGNOSIS — F3341 Major depressive disorder, recurrent, in partial remission: Secondary | ICD-10-CM

## 2017-05-06 DIAGNOSIS — E559 Vitamin D deficiency, unspecified: Secondary | ICD-10-CM

## 2017-05-06 DIAGNOSIS — R7309 Other abnormal glucose: Secondary | ICD-10-CM

## 2017-05-06 MED ORDER — BUPROPION HCL ER (XL) 150 MG PO TB24: 300.0000 mg | ORAL_TABLET | Freq: Every day | ORAL | 1 refills | Status: DC

## 2017-05-06 MED ORDER — ALPRAZOLAM 0.5 MG PO TABS: 0.5000 mg | ORAL_TABLET | Freq: Three times a day (TID) | ORAL | 0 refills | Status: DC | PRN

## 2017-05-06 MED ORDER — BUPROPION HCL ER (XL) 150 MG PO TB24: 150.0000 mg | ORAL_TABLET | Freq: Every morning | ORAL | 1 refills | Status: DC

## 2017-05-06 NOTE — Patient Instructions (Signed)
[employer] with no obligation # [phone] Do not have to be a member Tues-Sat 10-6  Reading- free test with no obligation # [phone] MUST BE A MEMBER Call for store hours  Have had patient's get good cheaper hearing aids from [employer] The air version has good reviews.   Monitor your blood pressure at home. Go to the ER if any CP, SOB, nausea, dizziness, severe HA, changes vision/speech  Goal BP:  For patients younger than 60: Goal BP < 140/90. For patients 60 and older: Goal BP < 150/90. For patients with diabetes: Goal BP < 140/90. Your most recent BP: BP: 126/90   Take your medications faithfully as instructed. Maintain a healthy weight. Get at least 150 minutes of aerobic exercise per week. Minimize salt intake. Minimize alcohol intake  DASH Eating Plan DASH stands for "Dietary Approaches to Stop Hypertension." The DASH eating plan is a healthy eating plan that has been shown to reduce high blood pressure (hypertension). Additional health benefits may include reducing the risk of type 2 diabetes mellitus, heart disease, and stroke. The DASH eating plan may also help with weight loss. WHAT DO I NEED TO KNOW ABOUT THE DASH EATING PLAN? For the DASH eating plan, you will follow these general guidelines:  Choose foods with a percent daily value for sodium of less than 5% (as listed on the food label).  Use salt-free seasonings or herbs instead of table salt or sea salt.  Check with your health care provider or pharmacist before using salt substitutes.  Eat lower-sodium products, often labeled as "lower sodium" or "no salt added."  Eat fresh foods.  Eat more vegetables, fruits, and low-fat dairy products.  Choose whole grains. Look for the word "whole" as the first word in the ingredient list.  Choose fish and skinless chicken or [place] more often than red meat. Limit fish, poultry, and meat to 6 oz (170 g) each day.  Limit  sweets, desserts, sugars, and sugary drinks.  Choose heart-healthy fats.  Limit cheese to 1 oz (28 g) per day.  Eat more home-cooked food and less restaurant, buffet, and fast food.  Limit fried foods.  Cook foods using methods other than frying.  Limit canned vegetables. If you do use them, rinse them well to decrease the sodium.  When eating at a restaurant, ask that your food be prepared with less salt, or no salt if possible. WHAT FOODS CAN I EAT? Seek help from a dietitian for individual calorie needs. Grains Whole grain or whole wheat bread. Brown rice. Whole grain or whole wheat pasta. Quinoa, bulgur, and whole grain cereals. Low-sodium cereals. Corn or whole wheat flour tortillas. Whole grain cornbread. Whole grain crackers. Low-sodium crackers. Vegetables Fresh or frozen vegetables (raw, steamed, roasted, or grilled). Low-sodium or reduced-sodium tomato and vegetable juices. Low-sodium or reduced-sodium tomato sauce and paste. Low-sodium or reduced-sodium canned vegetables.  Fruits All fresh, canned (in natural juice), or frozen fruits. Meat and Other Protein Products Ground beef (85% or leaner), grass-fed beef, or beef trimmed of fat. Skinless chicken or [place]. Ground chicken or [place]. Pork trimmed of fat. All fish and seafood. Eggs. Dried beans, peas, or lentils. Unsalted nuts and seeds. Unsalted canned beans. Dairy Low-fat dairy products, such as skim or 1% milk, 2% or reduced-fat cheeses, low-fat ricotta or cottage cheese, or plain low-fat yogurt. Low-sodium or reduced-sodium cheeses. Fats and Oils Tub margarines without trans fats. Light or reduced-fat mayonnaise and salad dressings (reduced  sodium). Avocado. Safflower, olive, or canola oils. Natural peanut or almond butter. Other Unsalted popcorn and pretzels. The items listed above may not be a complete list of recommended foods or beverages. Contact your dietitian for more options. WHAT FOODS ARE NOT  RECOMMENDED? Grains White bread. White pasta. White rice. Refined cornbread. Bagels and croissants. Crackers that contain trans fat. Vegetables Creamed or fried vegetables. Vegetables in a cheese sauce. Regular canned vegetables. Regular canned tomato sauce and paste. Regular tomato and vegetable juices. Fruits Dried fruits. Canned fruit in light or heavy syrup. Fruit juice. Meat and Other Protein Products Fatty cuts of meat. Ribs, chicken wings, bacon, sausage, bologna, salami, chitterlings, fatback, hot dogs, bratwurst, and packaged luncheon meats. Salted nuts and seeds. Canned beans with salt. Dairy Whole or 2% milk, cream, half-and-half, and cream cheese. Whole-fat or sweetened yogurt. Full-fat cheeses or blue cheese. Nondairy creamers and whipped toppings. Processed cheese, cheese spreads, or cheese curds. Condiments Onion and garlic salt, seasoned salt, table salt, and sea salt. Canned and packaged gravies. Worcestershire sauce. Tartar sauce. Barbecue sauce. Teriyaki sauce. Soy sauce, including reduced sodium. Steak sauce. Fish sauce. Oyster sauce. Cocktail sauce. Horseradish. Ketchup and mustard. Meat flavorings and tenderizers. Bouillon cubes. Hot sauce. Tabasco sauce. Marinades. Taco seasonings. Relishes. Fats and Oils Butter, stick margarine, lard, shortening, ghee, and bacon fat. Coconut, palm kernel, or palm oils. Regular salad dressings. Other Pickles and olives. Salted popcorn and pretzels. The items listed above may not be a complete list of foods and beverages to avoid. Contact your dietitian for more information. WHERE CAN I FIND MORE INFORMATION? National Heart, Lung, and Blood Institute: travelstabloid.com Document Released: 05/16/2011 Document Revised: 10/11/2013 Document Reviewed: 03/31/2013 [facility] Patient Information 2015 [place], [place]. This information is not intended to replace advice given to you by your health care provider. Make  sure you discuss any questions you have with your health care provider.

## 2017-05-07 ENCOUNTER — Encounter: Payer: Self-pay | Admitting: Physician Assistant

## 2017-05-07 LAB — HEPATIC FUNCTION PANEL
AG RATIO: 1.6 (calc) (ref 1.0–2.5)
ALT: 16 U/L (ref 6–29)
AST: 16 U/L (ref 10–35)
Albumin: 4 g/dL (ref 3.6–5.1)
Alkaline phosphatase (APISO): 105 U/L (ref 33–130)
BILIRUBIN INDIRECT: 0.3 mg/dL (ref 0.2–1.2)
Bilirubin, Direct: 0.1 mg/dL (ref 0.0–0.2)
GLOBULIN: 2.5 g/dL (ref 1.9–3.7)
Total Bilirubin: 0.4 mg/dL (ref 0.2–1.2)
Total Protein: 6.5 g/dL (ref 6.1–8.1)

## 2017-05-07 LAB — URINALYSIS, ROUTINE W REFLEX MICROSCOPIC
BILIRUBIN URINE: NEGATIVE
Glucose, UA: NEGATIVE
Hgb urine dipstick: NEGATIVE
Ketones, ur: NEGATIVE
LEUKOCYTES UA: NEGATIVE
NITRITE: NEGATIVE
PROTEIN: NEGATIVE
Specific Gravity, Urine: 1.003 (ref 1.001–1.03)
pH: 7.5 (ref 5.0–8.0)

## 2017-05-07 LAB — CBC WITH DIFFERENTIAL/PLATELET
BASOS PCT: 0.7 %
Basophils Absolute: 60 cells/uL (ref 0–200)
EOS ABS: 119 {cells}/uL (ref 15–500)
Eosinophils Relative: 1.4 %
HEMATOCRIT: 39.7 % (ref 35.0–45.0)
HEMOGLOBIN: 13.2 g/dL (ref 11.7–15.5)
Lymphs Abs: 1700 cells/uL (ref 850–3900)
MCH: 27.9 pg (ref 27.0–33.0)
MCHC: 33.2 g/dL (ref 32.0–36.0)
MCV: 83.9 fL (ref 80.0–100.0)
MPV: 10.8 fL (ref 7.5–12.5)
Monocytes Relative: 6.5 %
NEUTROS ABS: 6069 {cells}/uL (ref 1500–7800)
Neutrophils Relative %: 71.4 %
Platelets: 324 10*3/uL (ref 140–400)
RBC: 4.73 10*6/uL (ref 3.80–5.10)
RDW: 13.8 % (ref 11.0–15.0)
TOTAL LYMPHOCYTE: 20 %
WBC: 8.5 10*3/uL (ref 3.8–10.8)
WBCMIX: 553 {cells}/uL (ref 200–950)

## 2017-05-07 LAB — BASIC METABOLIC PANEL WITH GFR
BUN: 9 mg/dL (ref 7–25)
CO2: 26 mmol/L (ref 20–32)
CREATININE: 0.75 mg/dL (ref 0.50–1.05)
Calcium: 9.2 mg/dL (ref 8.6–10.4)
Chloride: 104 mmol/L (ref 98–110)
GFR, EST AFRICAN AMERICAN: 105 mL/min/{1.73_m2} (ref >=60)
GFR, EST NON AFRICAN AMERICAN: 90 mL/min/{1.73_m2} (ref >=60)
Glucose, Bld: 84 mg/dL (ref 65–99)
Potassium: 4.4 mmol/L (ref 3.5–5.3)
Sodium: 138 mmol/L (ref 135–146)

## 2017-05-07 LAB — LIPID PANEL
Cholesterol: 196 mg/dL (ref <200)
HDL: 47 mg/dL — ABNORMAL LOW (ref >50)
LDL Cholesterol (Calc): 126 mg/dL (calc) — ABNORMAL HIGH
NON-HDL CHOLESTEROL (CALC): 149 mg/dL — AB (ref <130)
TRIGLYCERIDES: 121 mg/dL (ref <150)
Total CHOL/HDL Ratio: 4.2 (calc) (ref <5.0)

## 2017-05-07 LAB — MICROALBUMIN / CREATININE URINE RATIO
CREATININE, URINE: 13 mg/dL — AB (ref 20–275)
MICROALB UR: 0.2 mg/dL
MICROALB/CREAT RATIO: 15 ug/mg{creat} (ref <30)

## 2017-05-07 LAB — VITAMIN D 25 HYDROXY (VIT D DEFICIENCY, FRACTURES): VIT D 25 HYDROXY: 80 ng/mL (ref 30–100)

## 2017-05-07 LAB — MAGNESIUM: MAGNESIUM: 2.2 mg/dL (ref 1.5–2.5)

## 2017-05-07 LAB — HEMOGLOBIN A1C
Hgb A1c MFr Bld: 5.5 % of total Hgb (ref <5.7)
Mean Plasma Glucose: 111 (calc)
eAG (mmol/L): 6.2 (calc)

## 2017-05-07 LAB — TSH: TSH: 1.19 mIU/L

## 2017-07-03 ENCOUNTER — Encounter: Payer: Self-pay | Admitting: Physician Assistant

## 2017-07-03 DIAGNOSIS — R42 Dizziness and giddiness: Secondary | ICD-10-CM

## 2017-07-08 DIAGNOSIS — Z1231 Encounter for screening mammogram for malignant neoplasm of breast: Secondary | ICD-10-CM | POA: Diagnosis not present

## 2017-07-08 LAB — HM MAMMOGRAPHY: HM Mammogram: NORMAL (ref 0–4)

## 2017-07-09 ENCOUNTER — Encounter: Payer: Self-pay | Admitting: Physician Assistant

## 2017-07-14 DIAGNOSIS — M1711 Unilateral primary osteoarthritis, right knee: Secondary | ICD-10-CM | POA: Diagnosis not present

## 2017-07-14 DIAGNOSIS — M25562 Pain in left knee: Secondary | ICD-10-CM | POA: Diagnosis not present

## 2017-08-01 DIAGNOSIS — M1711 Unilateral primary osteoarthritis, right knee: Secondary | ICD-10-CM | POA: Diagnosis not present

## 2017-08-01 DIAGNOSIS — M25562 Pain in left knee: Secondary | ICD-10-CM | POA: Diagnosis not present

## 2017-08-01 DIAGNOSIS — M1712 Unilateral primary osteoarthritis, left knee: Secondary | ICD-10-CM | POA: Diagnosis not present

## 2017-09-29 DIAGNOSIS — M1711 Unilateral primary osteoarthritis, right knee: Secondary | ICD-10-CM | POA: Diagnosis not present

## 2017-10-06 DIAGNOSIS — M1711 Unilateral primary osteoarthritis, right knee: Secondary | ICD-10-CM | POA: Diagnosis not present

## 2017-10-08 DIAGNOSIS — M25562 Pain in left knee: Secondary | ICD-10-CM | POA: Diagnosis not present

## 2017-10-08 DIAGNOSIS — S338XXA Sprain of other parts of lumbar spine and pelvis, initial encounter: Secondary | ICD-10-CM | POA: Diagnosis not present

## 2017-10-13 ENCOUNTER — Encounter: Payer: Self-pay | Admitting: Physician Assistant

## 2017-10-13 ENCOUNTER — Other Ambulatory Visit: Payer: Self-pay | Admitting: Physician Assistant

## 2017-10-13 DIAGNOSIS — M1711 Unilateral primary osteoarthritis, right knee: Secondary | ICD-10-CM | POA: Diagnosis not present

## 2017-10-13 MED ORDER — DIAZEPAM 5 MG PO TABS
ORAL_TABLET | ORAL | 0 refills | Status: DC
Start: 2017-10-13 — End: 2019-07-22

## 2017-10-14 ENCOUNTER — Other Ambulatory Visit: Payer: Self-pay | Admitting: Internal Medicine

## 2017-10-14 DIAGNOSIS — E079 Disorder of thyroid, unspecified: Secondary | ICD-10-CM

## 2017-10-27 DIAGNOSIS — M1711 Unilateral primary osteoarthritis, right knee: Secondary | ICD-10-CM | POA: Diagnosis not present

## 2017-11-04 DIAGNOSIS — E785 Hyperlipidemia, unspecified: Secondary | ICD-10-CM | POA: Insufficient documentation

## 2017-11-04 NOTE — Progress Notes (Signed)
FOLLOW UP  Assessment and Plan:   Labile Hypertension Initiate medication: hctz 25 mg daily  Monitor blood pressure at home; call if consistently over 130/80 Discussed DASH diet Advised to go to the ER if any CP, SOB, nausea, dizziness, severe HA, changes vision/speech, left arm numbness and tingling and jaw pain. Follow up in 1 month for NV and labs   Cholesterol Currently mildly elevated; working on lifestyle Continue low cholesterol diet and exercise.  Check lipid panel.   Prediabetes Continue diet and exercise.  Perform daily foot/skin check, notify office of any concerning changes.  Check A1C  Morbid obesity with co morbidities Long discussion about weight loss, diet, and exercise Recommended diet heavy in fruits and veggies and low in animal meats, cheeses, and dairy products, appropriate calorie intake Discussed ideal weight for height and initial weight goal (200 lb) Patient will work on resuming healthy diet, continue exercise, look into intermittent fasting Will follow up in 6 months  Hypothyroidism continue medications the same pending lab results reminded to take on an empty stomach 30-86mins before food.  check TSH level  Vitamin D Def At goal at last visit; continue supplementation to maintain goal of 70-100 Defer Vit D level  Continue diet and meds as discussed. Further disposition pending results of labs. Discussed med's effects and SE's.   Over 30 minutes of exam, counseling, chart review, and critical decision making was performed.   Future Appointments  Date Time Provider [place]  05/12/2018 10:00 AM Vicie Mutters, PA-C [id number] None    ----------------------------------------------------------------------------------------------------------------------  HPI 57 y.o. female  presents for 6 month follow up on BP, cholesterol, glucose management, morbid obesity and vitamin D deficiency.   BMI is Body mass index is 41.2 kg/m., she has  been working on exercise (water aerobics several times a week), not currently watching diet.  Wt Readings from Last 3 Encounters:  11/05/17 267 lb (121.1 kg)  05/06/17 262 lb 3.2 oz (118.9 kg)  08/22/16 267 lb (121.1 kg)   Today their BP is BP: (!) 156/92 She has previously been on HCTZ and done well with this  She does workout. She denies chest pain, shortness of breath, dizziness.   She is not on cholesterol medication and denies myalgias. Her cholesterol is not at goal. The cholesterol last visit was:   Lab Results  Component Value Date   CHOL 196 05/06/2017   HDL 47 (L) 05/06/2017   LDLCALC 126 (H) 05/06/2017   TRIG 121 05/06/2017   CHOLHDL 4.2 05/06/2017    She has not been working on diet and exercise for glucose management, and denies foot ulcerations, increased appetite, nausea, paresthesia of the feet, polydipsia, polyuria, visual disturbances, vomiting and weight loss. Last A1C in the office was:  Lab Results  Component Value Date   HGBA1C 5.5 05/06/2017   She is on thyroid medication. Her medication was not changed last visit.   Lab Results  Component Value Date   TSH 1.19 05/06/2017   Patient is on Vitamin D supplement.   Lab Results  Component Value Date   VD25OH 49 05/06/2017        Current Medications:  Current Outpatient Medications on File Prior to Visit  Medication Sig  . B Complex Vitamins (B COMPLEX PO) Take by mouth.  Marland Kitchen buPROPion (WELLBUTRIN XL) 150 MG 24 hr tablet Take 2 tablets (300 mg total) by mouth daily.  . Cholecalciferol (VITAMIN D PO) Take 5,000 Int'l Units by mouth daily.  . diazepam (VALIUM)  5 MG tablet 1/2-1 pill every 8 hours for dizziness/vertigo (Patient taking differently: as needed. 1/2-1 pill every 8 hours for dizziness/vertigo)  . levothyroxine (SYNTHROID, LEVOTHROID) 150 MCG tablet TAKE 1 TABLET BY MOUTH ONCE DAILY BEFORE  BREAKFAST (Patient taking differently: Take 1/2 tablet by mouth on Tuesdays & Thursdays and 1 tablet all other  days)  . MAGNESIUM PO Take by mouth.  . Omega-3 Fatty Acids (FISH OIL PO) Take 2,000 mg by mouth daily.  Marland Kitchen OVER THE COUNTER MEDICATION 400 mg daily. Bio Cocumin  . [place] 10-3.5-12 MG-GM-GM PACK    No current facility-administered medications on file prior to visit.      Allergies:  Allergies  Allergen Reactions  . Augmentin [Amoxicillin-Pot Clavulanate] Nausea And Vomiting     Medical History:  Past Medical History:  Diagnosis Date  . Thyroid disease    Family history- Reviewed and unchanged Social history- Reviewed and unchanged   Review of Systems:  Review of Systems  Constitutional: Negative for malaise/fatigue and weight loss.  HENT: Negative for hearing loss and tinnitus.   Eyes: Negative for blurred vision and double vision.  Respiratory: Negative for cough, shortness of breath and wheezing.   Cardiovascular: Negative for chest pain, palpitations, orthopnea, claudication and leg swelling.  Gastrointestinal: Negative for abdominal pain, blood in stool, constipation, diarrhea, heartburn, melena, nausea and vomiting.  Genitourinary: Negative.   Musculoskeletal: Negative for joint pain and myalgias.  Skin: Negative for rash.  Neurological: Negative for dizziness, tingling, sensory change, weakness and headaches.  Endo/Heme/Allergies: Negative for polydipsia.  Psychiatric/Behavioral: Negative.   All other systems reviewed and are negative.     Physical Exam: BP (!) 156/92   Pulse 75   Temp 97.9 F (36.6 C)   Ht 5' 7.5" (1.715 m)   Wt 267 lb (121.1 kg)   [name] 10/24/2017   SpO2 97%   BMI 41.20 kg/m  Wt Readings from Last 3 Encounters:  11/05/17 267 lb (121.1 kg)  05/06/17 262 lb 3.2 oz (118.9 kg)  08/22/16 267 lb (121.1 kg)   General Appearance: Well nourished, in no apparent distress. Eyes: PERRLA, EOMs, conjunctiva no swelling or erythema Sinuses: No Frontal/maxillary tenderness ENT/Mouth: Ext aud canals clear, TMs without erythema, bulging. No  erythema, swelling, or exudate on post pharynx.  Tonsils not swollen or erythematous. Hearing normal.  Neck: Supple, thyroid normal.  Respiratory: Respiratory effort normal, BS equal bilaterally without rales, rhonchi, wheezing or stridor.  Cardio: RRR with no MRGs. Brisk peripheral pulses without edema.  Abdomen: Soft, + BS.  Non tender, no guarding, rebound, hernias, masses. Lymphatics: Non tender without lymphadenopathy.  Musculoskeletal: Full ROM, 5/5 strength, Normal gait Skin: Warm, dry without rashes, lesions, ecchymosis.  Neuro: Cranial nerves intact. No cerebellar symptoms.  Psych: Awake and oriented X 3, normal affect, Insight and Judgment appropriate.    Izora Ribas, NP 11:13 AM [facility]

## 2017-11-05 ENCOUNTER — Ambulatory Visit: Payer: 59 | Admitting: Adult Health

## 2017-11-05 ENCOUNTER — Encounter: Payer: Self-pay | Admitting: Adult Health

## 2017-11-05 DIAGNOSIS — E079 Disorder of thyroid, unspecified: Secondary | ICD-10-CM | POA: Diagnosis not present

## 2017-11-05 DIAGNOSIS — Z79899 Other long term (current) drug therapy: Secondary | ICD-10-CM | POA: Diagnosis not present

## 2017-11-05 DIAGNOSIS — E785 Hyperlipidemia, unspecified: Secondary | ICD-10-CM | POA: Diagnosis not present

## 2017-11-05 DIAGNOSIS — I10 Essential (primary) hypertension: Secondary | ICD-10-CM | POA: Insufficient documentation

## 2017-11-05 DIAGNOSIS — R7309 Other abnormal glucose: Secondary | ICD-10-CM

## 2017-11-05 MED ORDER — HYDROCHLOROTHIAZIDE 25 MG PO TABS: ORAL_TABLET | ORAL | 1 refills | Status: DC

## 2017-11-05 NOTE — Patient Instructions (Addendum)
Aim for 7+ servings of fruits and vegetables daily  80+ fluid ounces of water or unsweet tea for healthy kidneys  Limit alcohol intake  Limit animal fats in diet for cholesterol and heart health - choose grass fed whenever available  Aim for low stress - take time to unwind and care for your mental health  Aim for 150 min of moderate intensity exercise weekly for heart health, and weights twice weekly for bone health  Aim for 7-9 hours of sleep daily      When it comes to diets, agreement about the perfect plan isn't easy to find, even among the experts. Experts at the [employer] developed an idea known as the Healthy Eating Plate. Just imagine a plate divided into logical, healthy portions.  The emphasis is on diet quality:  Load up on vegetables and fruits - one-half of your plate: Aim for color and variety, and remember that potatoes don't count.  Go for whole grains - one-quarter of your plate: Whole wheat, barley, wheat berries, quinoa, oats, brown rice, and foods made with them. If you want pasta, go with whole wheat pasta.  Protein power - one-quarter of your plate: Fish, chicken, beans, and nuts are all healthy, versatile protein sources. Limit red meat.  The diet, however, does go beyond the plate, offering a few other suggestions.  Use healthy plant oils, such as olive, canola, soy, corn, sunflower and peanut. Check the labels, and avoid partially hydrogenated oil, which have unhealthy trans fats.  If you're thirsty, drink water. Coffee and tea are good in moderation, but skip sugary drinks and limit milk and dairy products to one or two daily servings.  The type of carbohydrate in the diet is more important than the amount. Some sources of carbohydrates, such as vegetables, fruits, whole grains, and beans-are healthier than others.  Finally, stay active.       Monitor your blood pressure at home, please keep a record and bring that in with  you to your next office visit.   Go to the ER if any CP, SOB, nausea, dizziness, severe HA, changes vision/speech  Due to a recent study, SPRINT, we have changed our goal for the systolic or top blood pressure number. Ideally we want your top number at 120.  In the [facility] Trial, 5000 people were randomized to a goal BP of 120 and 5000 people were randomized to a goal BP of less than 140. The patients with the goal BP at 120 had LESS DEMENTIA, LESS HEART ATTACKS, AND LESS STROKES, AS WELL AS OVERALL DECREASED MORTALITY OR DEATH RATE.   If you are willing, our goal BP is the top number of 120.  Your most recent BP: BP: (!) 160/100   Take your medications faithfully as instructed. Maintain a healthy weight. Get at least 150 minutes of aerobic exercise per week. Minimize salt intake. Minimize alcohol intake    Intermittent fasting is more about strategy than starvation. It's meant to reset your body in different ways, hopefully with fitness and nutrition changes as a result.  Like any big switchover, though, results may vary when it comes down to the individual level. What works for your friends may not work for you, or vice versa. That's why it's helpful to play around with variations on intermittent fasting and healthy habits and find what works best for you.  WHAT IS INTERMITTENT FASTING AND WHY DO IT?  Intermittent fasting doesn't involve specific foods, but rather, a  strict schedule regarding when you eat. Also called "time-restricted eating," the tactic has been praised for its contribution to weight loss, improved body composition, and decreased cravings. Preliminary research also suggests it may be beneficial for glucose tolerance, hormone regulation, better muscle mass and lower body fat.  Part of its appeal is the simplicity of the effort. Unlike some other trends, there's no calculations to intermittent fasting.  You simply eat within a certain block of time, usually a window of  8-10 hours. In the other big block of time - about 14-16 hours, including when you're asleep - you don't eat anything, not even snacks. You can drink water, coffee, tea or any other beverage that doesn't have calories.  For example, if you like having a late dinner, you might skip breakfast and have your first meal at noon and your last meal of the day at 8 p.m., and then not eat until noon again the next day.  IDEAS FOR GETTING STARTED  If you're new to the strategy, it may be helpful to eat within the typical circadian rhythm and keep eating within daylight hours. This can be especially beneficial if you're looking at intermittent fasting for weight-loss goals.  So first try only eating between 12pm to 8pm.  Outside of this time you may have water, black coffee, and hot tea. You may not eat it drink anything that has carbs, sugars, OR artificial sugars like diet soda.   Like any major eating and fitness shift, it can take time to find the perfect fit, so don't be afraid to experiment with different options - including ditching intermittent fasting altogether if it's simply not for you. But if it is, you may be surprised by some of the benefits that come along with the strategy.

## 2017-11-06 LAB — LIPID PANEL
CHOL/HDL RATIO: 3.3 (calc) (ref <5.0)
CHOLESTEROL: 195 mg/dL (ref <200)
HDL: 60 mg/dL (ref >50)
LDL CHOLESTEROL (CALC): 114 mg/dL — AB
Non-HDL Cholesterol (Calc): 135 mg/dL (calc) — ABNORMAL HIGH (ref <130)
Triglycerides: 107 mg/dL (ref <150)

## 2017-11-06 LAB — CBC WITH DIFFERENTIAL/PLATELET
BASOS ABS: 60 {cells}/uL (ref 0–200)
Basophils Relative: 0.8 %
Eosinophils Absolute: 188 cells/uL (ref 15–500)
Eosinophils Relative: 2.5 %
HCT: 40.6 % (ref 35.0–45.0)
HEMOGLOBIN: 13.9 g/dL (ref 11.7–15.5)
Lymphs Abs: 1598 cells/uL (ref 850–3900)
MCH: 28.9 pg (ref 27.0–33.0)
MCHC: 34.2 g/dL (ref 32.0–36.0)
MCV: 84.4 fL (ref 80.0–100.0)
MONOS PCT: 11.1 %
MPV: 10.8 fL (ref 7.5–12.5)
NEUTROS ABS: 4823 {cells}/uL (ref 1500–7800)
NEUTROS PCT: 64.3 %
Platelets: 297 10*3/uL (ref 140–400)
RBC: 4.81 10*6/uL (ref 3.80–5.10)
RDW: 15.1 % — AB (ref 11.0–15.0)
Total Lymphocyte: 21.3 %
WBC mixed population: 833 cells/uL (ref 200–950)
WBC: 7.5 10*3/uL (ref 3.8–10.8)

## 2017-11-06 LAB — COMPLETE METABOLIC PANEL WITH GFR
AG Ratio: 2 (calc) (ref 1.0–2.5)
ALKALINE PHOSPHATASE (APISO): 107 U/L (ref 33–130)
ALT: 20 U/L (ref 6–29)
AST: 16 U/L (ref 10–35)
Albumin: 4.2 g/dL (ref 3.6–5.1)
BUN: 11 mg/dL (ref 7–25)
CO2: 28 mmol/L (ref 20–32)
CREATININE: 0.72 mg/dL (ref 0.50–1.05)
Calcium: 9.5 mg/dL (ref 8.6–10.4)
Chloride: 105 mmol/L (ref 98–110)
GFR, Est African American: 109 mL/min/{1.73_m2} (ref >=60)
GFR, Est Non African American: 94 mL/min/{1.73_m2} (ref >=60)
GLUCOSE: 79 mg/dL (ref 65–99)
Globulin: 2.1 g/dL (calc) (ref 1.9–3.7)
Potassium: 4.2 mmol/L (ref 3.5–5.3)
Sodium: 140 mmol/L (ref 135–146)
Total Bilirubin: 0.4 mg/dL (ref 0.2–1.2)
Total Protein: 6.3 g/dL (ref 6.1–8.1)

## 2017-11-06 LAB — HEMOGLOBIN A1C
Hgb A1c MFr Bld: 5.4 % of total Hgb (ref <5.7)
Mean Plasma Glucose: 108 (calc)
eAG (mmol/L): 6 (calc)

## 2017-11-06 LAB — TSH: TSH: 1.21 mIU/L

## 2017-12-04 ENCOUNTER — Other Ambulatory Visit (INDEPENDENT_AMBULATORY_CARE_PROVIDER_SITE_OTHER): Payer: 59

## 2017-12-04 DIAGNOSIS — I10 Essential (primary) hypertension: Secondary | ICD-10-CM | POA: Diagnosis not present

## 2017-12-04 DIAGNOSIS — Z79899 Other long term (current) drug therapy: Secondary | ICD-10-CM | POA: Diagnosis not present

## 2017-12-04 LAB — BASIC METABOLIC PANEL WITH GFR
BUN: 15 mg/dL (ref 7–25)
CALCIUM: 9.5 mg/dL (ref 8.6–10.4)
CO2: 29 mmol/L (ref 20–32)
Chloride: 103 mmol/L (ref 98–110)
Creat: 0.91 mg/dL (ref 0.50–1.05)
GFR, Est African American: 82 mL/min/{1.73_m2} (ref >=60)
GFR, Est Non African American: 71 mL/min/{1.73_m2} (ref >=60)
Glucose, Bld: 99 mg/dL (ref 65–99)
POTASSIUM: 4.6 mmol/L (ref 3.5–5.3)
SODIUM: 140 mmol/L (ref 135–146)

## 2017-12-04 MED ORDER — LOSARTAN POTASSIUM-HCTZ 100-25 MG PO TABS: ORAL_TABLET | ORAL | 1 refills | Status: DC

## 2017-12-04 NOTE — Progress Notes (Signed)
Patient presents to the office for a recheck of BP and to have labs drawn. Patient's BP today was 136/82. Has been taking HCTZ for 1 month. Patient has consistently taken BP at home with the average readings being 140/80. Patient advised per provider to stop taking the HCTZ and is adding on Losartan/HCTZ to start taking. Will return in three months for a lab and blood pressure recheck.

## 2017-12-17 ENCOUNTER — Other Ambulatory Visit: Payer: Self-pay | Admitting: Adult Health

## 2017-12-17 ENCOUNTER — Encounter: Payer: Self-pay | Admitting: Adult Health

## 2017-12-17 MED ORDER — BISOPROLOL-HYDROCHLOROTHIAZIDE 5-6.25 MG PO TABS: 1.0000 | ORAL_TABLET | Freq: Every day | ORAL | 1 refills | Status: DC

## 2018-01-09 ENCOUNTER — Other Ambulatory Visit: Payer: Self-pay | Admitting: Physician Assistant

## 2018-01-09 DIAGNOSIS — F3341 Major depressive disorder, recurrent, in partial remission: Secondary | ICD-10-CM

## 2018-01-12 ENCOUNTER — Other Ambulatory Visit: Payer: Self-pay | Admitting: Adult Health

## 2018-01-12 ENCOUNTER — Encounter: Payer: Self-pay | Admitting: Adult Health

## 2018-01-12 MED ORDER — BISOPROLOL-HYDROCHLOROTHIAZIDE 5-6.25 MG PO TABS: ORAL_TABLET | ORAL | 1 refills | Status: DC

## 2018-01-26 ENCOUNTER — Other Ambulatory Visit: Payer: Self-pay | Admitting: Adult Health

## 2018-01-26 DIAGNOSIS — H04123 Dry eye syndrome of bilateral lacrimal glands: Secondary | ICD-10-CM | POA: Diagnosis not present

## 2018-01-26 DIAGNOSIS — H02831 Dermatochalasis of right upper eyelid: Secondary | ICD-10-CM | POA: Diagnosis not present

## 2018-01-26 DIAGNOSIS — H26493 Other secondary cataract, bilateral: Secondary | ICD-10-CM | POA: Diagnosis not present

## 2018-01-26 MED ORDER — BISOPROLOL-HYDROCHLOROTHIAZIDE 5-6.25 MG PO TABS: ORAL_TABLET | ORAL | 1 refills | Status: DC

## 2018-01-26 MED ORDER — BISOPROLOL-HYDROCHLOROTHIAZIDE 10-6.25 MG PO TABS: 1.0000 | ORAL_TABLET | Freq: Every day | ORAL | 1 refills | Status: DC

## 2018-01-27 ENCOUNTER — Other Ambulatory Visit: Payer: Self-pay | Admitting: Adult Health

## 2018-01-27 MED ORDER — LOSARTAN POTASSIUM-HCTZ 100-25 MG PO TABS: ORAL_TABLET | ORAL | 1 refills | Status: DC

## 2018-02-11 DIAGNOSIS — M25562 Pain in left knee: Secondary | ICD-10-CM | POA: Diagnosis not present

## 2018-02-11 DIAGNOSIS — M25561 Pain in right knee: Secondary | ICD-10-CM | POA: Diagnosis not present

## 2018-03-03 ENCOUNTER — Other Ambulatory Visit (INDEPENDENT_AMBULATORY_CARE_PROVIDER_SITE_OTHER): Payer: 59 | Admitting: *Deleted

## 2018-03-03 DIAGNOSIS — Z79899 Other long term (current) drug therapy: Secondary | ICD-10-CM

## 2018-03-03 NOTE — Progress Notes (Signed)
Patient here to recheck a BMET and a BP check.  Her BP was 124/84 in her right arm and 118/84 in her left arm.  The pulse was 76.  She is taking Bisoprolol/HCTZ 5-6.25 mg 1 tablet daily for her blood pressure.

## 2018-03-04 LAB — BASIC METABOLIC PANEL WITH GFR
BUN: 15 mg/dL (ref 7–25)
CALCIUM: 10.2 mg/dL (ref 8.6–10.4)
CHLORIDE: 102 mmol/L (ref 98–110)
CO2: 28 mmol/L (ref 20–32)
Creat: 0.95 mg/dL (ref 0.50–1.05)
GFR, EST AFRICAN AMERICAN: 78 mL/min/{1.73_m2} (ref >=60)
GFR, Est Non African American: 67 mL/min/{1.73_m2} (ref >=60)
GLUCOSE: 86 mg/dL (ref 65–99)
POTASSIUM: 4.5 mmol/L (ref 3.5–5.3)
Sodium: 140 mmol/L (ref 135–146)

## 2018-03-10 MED ORDER — PHENTERMINE HCL 37.5 MG PO TABS: 37.5000 mg | ORAL_TABLET | Freq: Every day | ORAL | 2 refills | Status: DC

## 2018-03-18 DIAGNOSIS — Z01818 Encounter for other preprocedural examination: Secondary | ICD-10-CM | POA: Diagnosis not present

## 2018-03-24 ENCOUNTER — Other Ambulatory Visit: Payer: Self-pay

## 2018-03-24 MED ORDER — LOSARTAN POTASSIUM-HCTZ 100-25 MG PO TABS: ORAL_TABLET | ORAL | 1 refills | Status: DC

## 2018-03-24 NOTE — Telephone Encounter (Signed)
Printed out 03/24/2018 at 8:33am as requested.

## 2018-04-06 NOTE — Progress Notes (Signed)
Assessment and Plan:  Jenny Arroyo was seen today for shortness of breath and chest pain.  Diagnoses and all orders for this visit:  Chest tightness/Shortness of breath EKG unremarkable/unchanged, will check STAT troponin and d dimer, basic labs She will get CXR tomorrow at preop workup, will get CT if indicated by labs. Discussed she needs to notify of symptoms and today's workup, cannot fully rule out cardiac etiology and I would recommend she get a stress test prior to proceeding with surgery. Discussed risks associated with anesthesia and she expresses understanding.  The patient was advised to call immediately if she has any concerning symptoms in the interval. The patient voices understanding of current treatment options and is in agreement with the current care plan.The patient knows to call the clinic with any problems, questions or concerns or go to the ER if any further progression of symptoms.  -     CBC with Differential/Platelet -     COMPLETE METABOLIC PANEL WITH GFR -     Troponin I -     D-dimer, quantitative (not at [facility]) -     EKG 12-Lead  Further disposition pending results of labs. Discussed med's effects and SE's.   Over 15 minutes of exam, counseling, chart review, and critical decision making was performed.   Future Appointments  Date Time Provider [place]  05/12/2018 10:00 AM Vicie Mutters, PA-C [facility] None    ------------------------------------------------------------------------------------------------------------------   HPI 57 y.o.female with htn, hyperlipidemia, thyroid disease, morbid obesity presents for evaluation of chest tightness and mild dyspnea intermittently over the past 5 days or so. Currently actively feeling this way. Symptoms not worse or triggered by exertion, eating, lying down or other notable associations. She has upcoming TKA with Dr. Prentiss Bells in [place] on Nov 7th. She just wants to be checked out. She also endorse  ongoing hot flashes and unsure of whether this is related, also feeling somewhat stressed out.   No cough, wheezing, fever/chills, sneezing, swelling in legs, acid reflux or cough, hoarseness, burning, extremity.   She has no personal history of cardiac problems, PE, DVT, allergies, GERD, asthma. No related family history.   Past Medical History:  Diagnosis Date  . Thyroid disease      Allergies  Allergen Reactions  . Augmentin [Amoxicillin-Pot Clavulanate] Nausea And Vomiting    Current Outpatient Medications on File Prior to Visit  Medication Sig  . B Complex Vitamins (B COMPLEX PO) Take by mouth.  Marland Kitchen buPROPion (WELLBUTRIN XL) 150 MG 24 hr tablet TAKE 2 TABLETS BY MOUTH ONCE DAILY (Patient taking differently: 150 mg daily. Take one tablet by mouth daily)  . Cholecalciferol (VITAMIN D PO) Take 5,000 Int'l Units by mouth daily.  . diazepam (VALIUM) 5 MG tablet 1/2-1 pill every 8 hours for dizziness/vertigo (Patient taking differently: as needed. 1/2-1 pill every 8 hours for dizziness/vertigo)  . levothyroxine (SYNTHROID, LEVOTHROID) 150 MCG tablet TAKE 1 TABLET BY MOUTH ONCE DAILY BEFORE  BREAKFAST (Patient taking differently: Take 1/2 tablet by mouth on Tuesdays & Thursdays and 1 tablet all other days)  . losartan-hydrochlorothiazide (HYZAAR) 100-25 MG tablet Take 1/2-1 tab daily for BP goal <130/80  . MAGNESIUM PO Take by mouth.  . Omega-3 Fatty Acids (FISH OIL PO) Take 2,000 mg by mouth daily.  . bisoprolol-hydrochlorothiazide (ZIAC) 5-6.25 MG tablet Take 1-2 tabs by mouth daily for BP goal <130/80.  Marland Kitchen OVER THE COUNTER MEDICATION 400 mg daily. Bio Cocumin  . phentermine (ADIPEX-P) 37.5 MG tablet Take 1 tablet (37.5  mg total) by mouth daily before breakfast. (Patient not taking: Reported on 04/07/2018)  . [place] 10-3.5-12 MG-GM-GM PACK    No current facility-administered medications on file prior to visit.     ROS: Review of Systems  Constitutional: Negative for chills,  diaphoresis, fever, malaise/fatigue and weight loss.  HENT: Negative for congestion, hearing loss, sore throat and tinnitus.   Eyes: Negative for blurred vision and double vision.  Respiratory: Positive for shortness of breath. Negative for cough, hemoptysis, sputum production and wheezing.   Cardiovascular: Negative for chest pain, palpitations, orthopnea, claudication and leg swelling.  Gastrointestinal: Negative for abdominal pain, blood in stool, constipation, diarrhea, heartburn, melena, nausea and vomiting.  Genitourinary: Negative.   Musculoskeletal: Negative for joint pain, myalgias and neck pain.  Skin: Negative for rash.  Neurological: Negative for dizziness, tingling, sensory change, weakness and headaches.  Endo/Heme/Allergies: Negative for polydipsia.  Psychiatric/Behavioral: Negative.  Negative for depression, substance abuse and suicidal ideas. The patient is not nervous/anxious and does not have insomnia.   All other systems reviewed and are negative.   Physical Exam:  BP 114/78   Pulse 82   Temp 97.7 F (36.5 C)   Ht 5' 7.5" (1.715 m)   Wt 259 lb (117.5 kg)   SpO2 95%   BMI 39.97 kg/m   General Appearance: Well nourished, in no apparent distress. Eyes: PERRLA, EOMs, conjunctiva no swelling or erythema Sinuses: No Frontal/maxillary tenderness ENT/Mouth: Ext aud canals clear, TMs without erythema, bulging. No erythema, swelling, or exudate on post pharynx.  Tonsils not swollen or erythematous. Hearing normal.  Neck: Supple, thyroid normal.  Respiratory: Respiratory effort normal, BS equal bilaterally without rales, rhonchi, wheezing or stridor.  Cardio: RRR with no MRGs. Brisk peripheral pulses without edema.  Abdomen: Soft, + BS.  Non tender, no guarding, rebound, hernias, masses. Lymphatics: Non tender without lymphadenopathy.  Musculoskeletal: Full ROM, 5/5 strength, normal gait. No chest wall tenderness Skin: Warm, dry without rashes, lesions, ecchymosis.   Neuro: Normal muscle tone,  Sensation intact.  Psych: Awake and oriented X 3, normal affect, Insight and Judgment appropriate.     Izora Ribas, NP 2:48 PM [facility]

## 2018-04-07 ENCOUNTER — Encounter: Payer: Self-pay | Admitting: Adult Health

## 2018-04-07 ENCOUNTER — Ambulatory Visit: Payer: 59 | Admitting: Adult Health

## 2018-04-07 VITALS — BP 114/78 | HR 82 | Temp 97.7°F | Ht 67.5 in | Wt 259.0 lb

## 2018-04-07 DIAGNOSIS — R0602 Shortness of breath: Secondary | ICD-10-CM

## 2018-04-07 DIAGNOSIS — R0789 Other chest pain: Secondary | ICD-10-CM

## 2018-04-07 LAB — COMPLETE METABOLIC PANEL WITH GFR
AG RATIO: 2.1 (calc) (ref 1.0–2.5)
ALKALINE PHOSPHATASE (APISO): 100 U/L (ref 33–130)
ALT: 32 U/L — AB (ref 6–29)
AST: 21 U/L (ref 10–35)
Albumin: 4.6 g/dL (ref 3.6–5.1)
BILIRUBIN TOTAL: 0.5 mg/dL (ref 0.2–1.2)
BUN: 15 mg/dL (ref 7–25)
CHLORIDE: 101 mmol/L (ref 98–110)
CO2: 30 mmol/L (ref 20–32)
Calcium: 9.9 mg/dL (ref 8.6–10.4)
Creat: 0.88 mg/dL (ref 0.50–1.05)
GFR, Est African American: 86 mL/min/{1.73_m2} (ref >=60)
GFR, Est Non African American: 74 mL/min/{1.73_m2} (ref >=60)
Globulin: 2.2 g/dL (calc) (ref 1.9–3.7)
Glucose, Bld: 95 mg/dL (ref 65–99)
POTASSIUM: 3.7 mmol/L (ref 3.5–5.3)
Sodium: 139 mmol/L (ref 135–146)
Total Protein: 6.8 g/dL (ref 6.1–8.1)

## 2018-04-07 LAB — CBC WITH DIFFERENTIAL/PLATELET
Basophils Absolute: 57 cells/uL (ref 0–200)
Basophils Relative: 0.8 %
EOS ABS: 156 {cells}/uL (ref 15–500)
Eosinophils Relative: 2.2 %
HCT: 43 % (ref 35.0–45.0)
Hemoglobin: 15 g/dL (ref 11.7–15.5)
Lymphs Abs: 1583 cells/uL (ref 850–3900)
MCH: 30.4 pg (ref 27.0–33.0)
MCHC: 34.9 g/dL (ref 32.0–36.0)
MCV: 87 fL (ref 80.0–100.0)
MPV: 10.7 fL (ref 7.5–12.5)
Monocytes Relative: 10.2 %
NEUTROS PCT: 64.5 %
Neutro Abs: 4580 cells/uL (ref 1500–7800)
PLATELETS: 286 10*3/uL (ref 140–400)
RBC: 4.94 10*6/uL (ref 3.80–5.10)
RDW: 14.5 % (ref 11.0–15.0)
TOTAL LYMPHOCYTE: 22.3 %
WBC: 7.1 10*3/uL (ref 3.8–10.8)
WBCMIX: 724 {cells}/uL (ref 200–950)

## 2018-04-07 LAB — D-DIMER, QUANTITATIVE (NOT AT ARMC): D DIMER QUANT: 0.4 ug{FEU}/mL (ref <0.50)

## 2018-04-07 LAB — TROPONIN I: Troponin I: <0.01 ng/mL (ref <=0.0)

## 2018-04-07 NOTE — Patient Instructions (Signed)
  I am checking EKG, CBC, CMP, Troponin and D- dimer today and will call you if any of this is concerning. Please discuss your symptoms with your ortho doctor tomorrow, he may want a stress test prior to proceeding with surgery.   Go to the ER if any chest pain, shortness of breath, nausea, dizziness, severe HA, changes vision/speech   Shortness of Breath, Adult Shortness of breath means you have trouble breathing. Your lungs are organs for breathing. Follow these instructions at home: Pay attention to any changes in your symptoms. Take these actions to help with your condition:  Do not smoke. Smoking can cause shortness of breath. If you need help to quit smoking, ask your doctor.  Avoid things that can make it harder to breathe, such as: ? Mold. ? Dust. ? Air pollution. ? Chemical smells. ? Things that can cause allergy symptoms (allergens), if you have allergies.  Keep your living space clean and free of mold and dust.  Rest as needed. Slowly return to your usual activities.  Take over-the-counter and prescription medicines, including oxygen and inhaled medicines, only as told by your doctor.  Keep all follow-up visits as told by your doctor. This is important.  Contact a doctor if:  Your condition does not get better as soon as expected.  You have a hard time doing your normal activities, even after you rest.  You have new symptoms. Get help right away if:  You have trouble breathing when you are resting.  You feel light-headed or you faint.  You have a cough that is not helped by medicines.  You cough up blood.  You have pain with breathing.  You have pain in your chest, arms, shoulders, or belly (abdomen).  You have a fever.  You cannot walk up stairs.  You cannot exercise the way you normally do. This information is not intended to replace advice given to you by your health care provider. Make sure you discuss any questions you have with your health care  provider. Document Released: 11/13/2007 Document Revised: 06/13/2016 Document Reviewed: 06/13/2016 Elsevier Interactive Patient Education  2017 [employer].

## 2018-04-08 DIAGNOSIS — Z01818 Encounter for other preprocedural examination: Secondary | ICD-10-CM | POA: Diagnosis not present

## 2018-04-08 DIAGNOSIS — E039 Hypothyroidism, unspecified: Secondary | ICD-10-CM | POA: Diagnosis not present

## 2018-04-08 DIAGNOSIS — I10 Essential (primary) hypertension: Secondary | ICD-10-CM | POA: Diagnosis not present

## 2018-04-10 ENCOUNTER — Other Ambulatory Visit: Payer: Self-pay | Admitting: Internal Medicine

## 2018-04-10 DIAGNOSIS — E079 Disorder of thyroid, unspecified: Secondary | ICD-10-CM

## 2018-04-10 HISTORY — PX: REPLACEMENT TOTAL KNEE: SUR1224

## 2018-04-16 DIAGNOSIS — Z96659 Presence of unspecified artificial knee joint: Secondary | ICD-10-CM | POA: Diagnosis not present

## 2018-04-16 DIAGNOSIS — I10 Essential (primary) hypertension: Secondary | ICD-10-CM | POA: Diagnosis not present

## 2018-04-16 DIAGNOSIS — E039 Hypothyroidism, unspecified: Secondary | ICD-10-CM | POA: Diagnosis not present

## 2018-04-16 DIAGNOSIS — M1711 Unilateral primary osteoarthritis, right knee: Secondary | ICD-10-CM | POA: Diagnosis not present

## 2018-04-16 DIAGNOSIS — Z0389 Encounter for observation for other suspected diseases and conditions ruled out: Secondary | ICD-10-CM | POA: Diagnosis not present

## 2018-04-16 DIAGNOSIS — G8918 Other acute postprocedural pain: Secondary | ICD-10-CM | POA: Diagnosis not present

## 2018-04-20 ENCOUNTER — Other Ambulatory Visit: Payer: Self-pay | Admitting: Internal Medicine

## 2018-04-20 MED ORDER — LOSARTAN POTASSIUM 100 MG PO TABS: ORAL_TABLET | ORAL | 1 refills | Status: DC

## 2018-04-20 MED ORDER — HYDROCHLOROTHIAZIDE 25 MG PO TABS: ORAL_TABLET | ORAL | 1 refills | Status: DC

## 2018-04-22 DIAGNOSIS — I10 Essential (primary) hypertension: Secondary | ICD-10-CM | POA: Diagnosis not present

## 2018-04-22 DIAGNOSIS — Z471 Aftercare following joint replacement surgery: Secondary | ICD-10-CM | POA: Diagnosis not present

## 2018-04-24 DIAGNOSIS — Z471 Aftercare following joint replacement surgery: Secondary | ICD-10-CM | POA: Diagnosis not present

## 2018-04-24 DIAGNOSIS — I10 Essential (primary) hypertension: Secondary | ICD-10-CM | POA: Diagnosis not present

## 2018-04-27 DIAGNOSIS — Z471 Aftercare following joint replacement surgery: Secondary | ICD-10-CM | POA: Diagnosis not present

## 2018-04-27 DIAGNOSIS — I10 Essential (primary) hypertension: Secondary | ICD-10-CM | POA: Diagnosis not present

## 2018-04-30 DIAGNOSIS — I10 Essential (primary) hypertension: Secondary | ICD-10-CM | POA: Diagnosis not present

## 2018-04-30 DIAGNOSIS — Z471 Aftercare following joint replacement surgery: Secondary | ICD-10-CM | POA: Diagnosis not present

## 2018-05-04 DIAGNOSIS — Z471 Aftercare following joint replacement surgery: Secondary | ICD-10-CM | POA: Diagnosis not present

## 2018-05-04 DIAGNOSIS — M25561 Pain in right knee: Secondary | ICD-10-CM | POA: Diagnosis not present

## 2018-05-04 DIAGNOSIS — Z96651 Presence of right artificial knee joint: Secondary | ICD-10-CM | POA: Diagnosis not present

## 2018-05-05 DIAGNOSIS — Z7901 Long term (current) use of anticoagulants: Secondary | ICD-10-CM | POA: Diagnosis not present

## 2018-05-06 DIAGNOSIS — M25561 Pain in right knee: Secondary | ICD-10-CM | POA: Diagnosis not present

## 2018-05-06 DIAGNOSIS — Z471 Aftercare following joint replacement surgery: Secondary | ICD-10-CM | POA: Diagnosis not present

## 2018-05-06 DIAGNOSIS — Z96651 Presence of right artificial knee joint: Secondary | ICD-10-CM | POA: Diagnosis not present

## 2018-05-11 DIAGNOSIS — Z01818 Encounter for other preprocedural examination: Secondary | ICD-10-CM | POA: Diagnosis not present

## 2018-05-11 DIAGNOSIS — Z471 Aftercare following joint replacement surgery: Secondary | ICD-10-CM | POA: Diagnosis not present

## 2018-05-12 ENCOUNTER — Encounter: Payer: Self-pay | Admitting: Physician Assistant

## 2018-05-12 DIAGNOSIS — M25561 Pain in right knee: Secondary | ICD-10-CM | POA: Diagnosis not present

## 2018-05-12 DIAGNOSIS — Z471 Aftercare following joint replacement surgery: Secondary | ICD-10-CM | POA: Diagnosis not present

## 2018-05-12 DIAGNOSIS — Z96651 Presence of right artificial knee joint: Secondary | ICD-10-CM | POA: Diagnosis not present

## 2018-05-15 DIAGNOSIS — M25561 Pain in right knee: Secondary | ICD-10-CM | POA: Diagnosis not present

## 2018-05-15 DIAGNOSIS — Z471 Aftercare following joint replacement surgery: Secondary | ICD-10-CM | POA: Diagnosis not present

## 2018-05-15 DIAGNOSIS — Z96651 Presence of right artificial knee joint: Secondary | ICD-10-CM | POA: Diagnosis not present

## 2018-05-18 DIAGNOSIS — M25561 Pain in right knee: Secondary | ICD-10-CM | POA: Diagnosis not present

## 2018-05-18 DIAGNOSIS — Z471 Aftercare following joint replacement surgery: Secondary | ICD-10-CM | POA: Diagnosis not present

## 2018-05-18 DIAGNOSIS — Z96651 Presence of right artificial knee joint: Secondary | ICD-10-CM | POA: Diagnosis not present

## 2018-05-21 DIAGNOSIS — Z96651 Presence of right artificial knee joint: Secondary | ICD-10-CM | POA: Diagnosis not present

## 2018-05-21 DIAGNOSIS — M25561 Pain in right knee: Secondary | ICD-10-CM | POA: Diagnosis not present

## 2018-05-21 DIAGNOSIS — Z471 Aftercare following joint replacement surgery: Secondary | ICD-10-CM | POA: Diagnosis not present

## 2018-05-25 DIAGNOSIS — M25561 Pain in right knee: Secondary | ICD-10-CM | POA: Diagnosis not present

## 2018-05-25 DIAGNOSIS — Z96651 Presence of right artificial knee joint: Secondary | ICD-10-CM | POA: Diagnosis not present

## 2018-05-25 DIAGNOSIS — Z471 Aftercare following joint replacement surgery: Secondary | ICD-10-CM | POA: Diagnosis not present

## 2018-05-29 DIAGNOSIS — M25561 Pain in right knee: Secondary | ICD-10-CM | POA: Diagnosis not present

## 2018-05-29 DIAGNOSIS — Z471 Aftercare following joint replacement surgery: Secondary | ICD-10-CM | POA: Diagnosis not present

## 2018-05-29 DIAGNOSIS — Z96651 Presence of right artificial knee joint: Secondary | ICD-10-CM | POA: Diagnosis not present

## 2018-06-01 DIAGNOSIS — M25561 Pain in right knee: Secondary | ICD-10-CM | POA: Diagnosis not present

## 2018-06-01 DIAGNOSIS — Z471 Aftercare following joint replacement surgery: Secondary | ICD-10-CM | POA: Diagnosis not present

## 2018-06-01 DIAGNOSIS — Z96651 Presence of right artificial knee joint: Secondary | ICD-10-CM | POA: Diagnosis not present

## 2018-06-04 DIAGNOSIS — Z471 Aftercare following joint replacement surgery: Secondary | ICD-10-CM | POA: Diagnosis not present

## 2018-06-04 DIAGNOSIS — Z96651 Presence of right artificial knee joint: Secondary | ICD-10-CM | POA: Diagnosis not present

## 2018-06-04 DIAGNOSIS — M25561 Pain in right knee: Secondary | ICD-10-CM | POA: Diagnosis not present

## 2018-06-08 DIAGNOSIS — Z471 Aftercare following joint replacement surgery: Secondary | ICD-10-CM | POA: Diagnosis not present

## 2018-06-08 DIAGNOSIS — Z96651 Presence of right artificial knee joint: Secondary | ICD-10-CM | POA: Diagnosis not present

## 2018-06-08 DIAGNOSIS — M25561 Pain in right knee: Secondary | ICD-10-CM | POA: Diagnosis not present

## 2018-06-09 ENCOUNTER — Other Ambulatory Visit: Payer: Self-pay | Admitting: *Deleted

## 2018-06-09 DIAGNOSIS — E079 Disorder of thyroid, unspecified: Secondary | ICD-10-CM

## 2018-06-09 DIAGNOSIS — F3341 Major depressive disorder, recurrent, in partial remission: Secondary | ICD-10-CM

## 2018-06-09 MED ORDER — BUPROPION HCL ER (XL) 150 MG PO TB24: 300.0000 mg | ORAL_TABLET | Freq: Every day | ORAL | 1 refills | Status: DC

## 2018-06-09 MED ORDER — LEVOTHYROXINE SODIUM 150 MCG PO TABS: ORAL_TABLET | ORAL | 1 refills | Status: DC

## 2018-06-09 MED ORDER — LOSARTAN POTASSIUM 100 MG PO TABS: ORAL_TABLET | ORAL | 1 refills | Status: DC

## 2018-06-12 DIAGNOSIS — Z96651 Presence of right artificial knee joint: Secondary | ICD-10-CM | POA: Diagnosis not present

## 2018-06-12 DIAGNOSIS — M25561 Pain in right knee: Secondary | ICD-10-CM | POA: Diagnosis not present

## 2018-06-12 DIAGNOSIS — Z471 Aftercare following joint replacement surgery: Secondary | ICD-10-CM | POA: Diagnosis not present

## 2018-06-16 DIAGNOSIS — Z471 Aftercare following joint replacement surgery: Secondary | ICD-10-CM | POA: Diagnosis not present

## 2018-06-16 DIAGNOSIS — M25561 Pain in right knee: Secondary | ICD-10-CM | POA: Diagnosis not present

## 2018-06-16 DIAGNOSIS — Z96651 Presence of right artificial knee joint: Secondary | ICD-10-CM | POA: Diagnosis not present

## 2018-06-18 ENCOUNTER — Encounter: Payer: Self-pay | Admitting: Physician Assistant

## 2018-06-18 ENCOUNTER — Ambulatory Visit (INDEPENDENT_AMBULATORY_CARE_PROVIDER_SITE_OTHER): Payer: 59 | Admitting: Physician Assistant

## 2018-06-18 ENCOUNTER — Other Ambulatory Visit (HOSPITAL_COMMUNITY)
Admission: RE | Admit: 2018-06-18 | Discharge: 2018-06-18 | Disposition: A | Discharge status: Home / Self Care | Payer: 59 | Source: Ambulatory Visit | Attending: Physician Assistant | Admitting: Physician Assistant

## 2018-06-18 VITALS — BP 128/76 | HR 76 | Ht 67.0 in | Wt 272.6 lb

## 2018-06-18 DIAGNOSIS — E785 Hyperlipidemia, unspecified: Secondary | ICD-10-CM

## 2018-06-18 DIAGNOSIS — Z23 Encounter for immunization: Secondary | ICD-10-CM

## 2018-06-18 DIAGNOSIS — E079 Disorder of thyroid, unspecified: Secondary | ICD-10-CM

## 2018-06-18 DIAGNOSIS — Z0000 Encounter for general adult medical examination without abnormal findings: Secondary | ICD-10-CM

## 2018-06-18 DIAGNOSIS — Z124 Encounter for screening for malignant neoplasm of cervix: Secondary | ICD-10-CM

## 2018-06-18 DIAGNOSIS — I10 Essential (primary) hypertension: Secondary | ICD-10-CM

## 2018-06-18 DIAGNOSIS — N898 Other specified noninflammatory disorders of vagina: Secondary | ICD-10-CM

## 2018-06-18 DIAGNOSIS — R7309 Other abnormal glucose: Secondary | ICD-10-CM | POA: Diagnosis not present

## 2018-06-18 DIAGNOSIS — Z79899 Other long term (current) drug therapy: Secondary | ICD-10-CM

## 2018-06-18 DIAGNOSIS — E559 Vitamin D deficiency, unspecified: Secondary | ICD-10-CM

## 2018-06-18 MED ORDER — TRIAMCINOLONE ACETONIDE 0.5 % EX CREA: 1.0000 "application " | TOPICAL_CREAM | Freq: Two times a day (BID) | CUTANEOUS | 2 refills | Status: DC

## 2018-06-18 NOTE — Progress Notes (Signed)
Complete Physical  Assessment and Plan: Thyroid disease Hypothyroidism-check TSH level, continue medications the same, reminded to take on an empty stomach 30-100mins before food.  -     TSH -     levothyroxine (SYNTHROID, LEVOTHROID) 150 MCG tablet; TAKE ONE TABLET BY MOUTH ONCE DAILY BEFORE BREAKFAST  Obesity Obesity with co morbidities- long discussion about weight loss, diet, and exercise  Essential hypertension - continue medications, DASH diet, exercise and monitor at home. Call if greater than 130/80.  -     CBC with Differential/Platelet -     BASIC METABOLIC PANEL WITH GFR -     Hepatic function panel -     Urinalysis, Routine w reflex microscopic (not at [facility]) -     Microalbumin / creatinine urine ratio -     EKG 12-Lead  Vitamin D deficiency -     VITAMIN D 25 Hydroxy (Vit-D Deficiency, Fractures)  Medication management -     Magnesium  Other abnormal glucose Discussed disease progression and risks Discussed diet/exercise, weight management and risk modification -     Hemoglobin A1c  Hyperlipidemia, unspecified hyperlipidemia type -continue medications, check lipids, decrease fatty foods, increase activity.  -     Lipid panel  Routine general medical examination at a health care facility 1 year  BMI 40.0-44.9, adult ([place]) - follow up 3 months for progress monitoring - increase veggies, decrease carbs - long discussion about weight loss, diet, and exercise  Depression, major, recurrent, in partial remission (HCC) -     buPROPion (WELLBUTRIN XL) 150 MG 24 hr tablet; Take 2 tablets (300 mg total) by mouth daily. -     ALPRAZolam (XANAX) 0.5 MG tablet; Take 1 tablet (0.5 mg total) by mouth 3 (three) times daily as needed for anxiety (vertigo).  Need for diphtheria-tetanus-pertussis (Tdap) vaccine -     Tdap vaccine greater than or equal to 7yo IM  Screening for cervical cancer -     Cancel: Cytology - PAP -     Cytology - PAP  Vaginal irritation Check for  yeast/BV Possible atrophy /lichen sclerosis -     triamcinolone cream (KENALOG) 0.5 %; Apply 1 application topically 2 (two) times daily.     Discussed med's effects and SE's. Screening labs and tests as requested with regular follow-up as recommended. Future Appointments  Date Time Provider [place]  06/23/2019 10:00 AM Vicie Mutters, PA-C [facility] None     HPI 58 y.o. female  presents for a complete physical.   Her blood pressure has been controlled at home, today their BP is BP: 128/76 She does workout, she is doing group exercise at [employer].  She denies chest pain, shortness of breath, dizziness.   She had right TK 9 weeks ago with Dr. Deanne Coffer in [place], still having pain but states is stronger. She still has tightness and it is worse at night. She has had some depression with the knee surgery and the holidays, she is only on one wellbutrin.   She is not on cholesterol medication and denies myalgias. Her cholesterol is at goal. The cholesterol last visit was:   Lab Results  Component Value Date   CHOL 195 11/05/2017   HDL 60 11/05/2017   LDLCALC 114 (H) 11/05/2017   TRIG 107 11/05/2017   CHOLHDL 3.3 11/05/2017   Last A1C in the office was:  Lab Results  Component Value Date   HGBA1C 5.4 11/05/2017   Patient is on Vitamin D supplement.  Lab Results  Component Value Date   VD25OH 33 05/06/2017   She was has phentermine at home but has to very slowly increase it to not feel bad, has been on contrave but stopped due to constipation. She is on wellbutrin everyday to help with weight.   Wt Readings from Last 3 Encounters:  06/18/18 272 lb 9.6 oz (123.7 kg)  04/07/18 259 lb (117.5 kg)  11/05/17 267 lb (121.1 kg)   She is on thyroid medication. Her medication was not changed last visit. Patient denies heat / cold intolerance, nervousness and palpitations.  Lab Results  Component Value Date   TSH 1.21 11/05/2017  .  Current Medications:   Current Outpatient Medications on File Prior to Visit  Medication Sig Dispense Refill  . B Complex Vitamins (B COMPLEX PO) Take by mouth.    . bisoprolol-hydrochlorothiazide (ZIAC) 5-6.25 MG tablet Take 1-2 tabs by mouth daily for BP goal <130/80. 30 tablet 1  . buPROPion (WELLBUTRIN XL) 150 MG 24 hr tablet Take 2 tablets (300 mg total) by mouth daily. 180 tablet 1  . Cholecalciferol (VITAMIN D PO) Take 5,000 Int'l Units by mouth daily.    . diazepam (VALIUM) 5 MG tablet 1/2-1 pill every 8 hours for dizziness/vertigo (Patient taking differently: as needed. 1/2-1 pill every 8 hours for dizziness/vertigo) 30 tablet 0  . levothyroxine (SYNTHROID, LEVOTHROID) 150 MCG tablet Take 1/2 tablet by mouth on Tuesdays & Thursdays and 1 tablet all other days 90 tablet 1  . MAGNESIUM PO Take by mouth.    . Omega-3 Fatty Acids (FISH OIL PO) Take 2,000 mg by mouth daily.    Marland Kitchen OVER THE COUNTER MEDICATION 400 mg daily. Bio Cocumin    . phentermine (ADIPEX-P) 37.5 MG tablet Take 1 tablet (37.5 mg total) by mouth daily before breakfast. 30 tablet 2  . [place] 10-3.5-12 MG-GM-GM PACK      No current facility-administered medications on file prior to visit.    Health Maintenance:   Immunization History  Administered Date(s) Administered  . Influenza [place] 05/10/2014  . Td 06/11/2007   Tetanus: GOT TODAY Pneumovax: Prevnar 13: Flu vaccine: 2015 declines Zostavax:  Pap: June 2014 normal PAP would like to do every 5 years  No [name] recorded. MGM: 07/08/2017 CAT B DEXA: n/A Colonoscopy: [facility] in [place]- 2015 due in 5 years EGD:  Medical History:  Past Medical History:  Diagnosis Date  . Thyroid disease    Allergies Allergies  Allergen Reactions  . Augmentin [Amoxicillin-Pot Clavulanate] Nausea And Vomiting   SURGICAL HISTORY She  has a past surgical history that includes Knee arthroscopy (Right, 2015). FAMILY HISTORY Her family history includes Alzheimer's disease (age of onset: 12) in her  maternal grandfather; Alzheimer's disease (age of onset: 60) in her mother; Arthritis in her father; Cancer in her father; Depression in her brother; Gout in her father; Hypertension in her father and mother; Mental illness in her daughter. SOCIAL HISTORY She  reports that she has never smoked. She has never used smokeless tobacco. She reports that she does not drink alcohol or use drugs.  Review of Systems  Constitutional: Negative.   HENT: Negative.   Eyes: Negative.   Respiratory: Negative.   Cardiovascular: Negative.   Gastrointestinal: Positive for constipation.  Genitourinary: Negative.   Musculoskeletal: Positive for joint pain (right knee RECENT SURGERY). Negative for back pain, falls, myalgias and neck pain.  Skin: Negative.   Neurological: Negative.   Psychiatric/Behavioral: Negative.     Physical Exam: Estimated  body mass index is 42.7 kg/m as calculated from the following:   Height as of this encounter: 5\' 7"  (1.702 m).   Weight as of this encounter: 272 lb 9.6 oz (123.7 kg). BP 128/76   Pulse 76   Ht 5\' 7"  (1.702 m)   Wt 272 lb 9.6 oz (123.7 kg)   SpO2 97%   BMI 42.70 kg/m  General Appearance: Well nourished, in no apparent distress. Eyes: PERRLA, EOMs, conjunctiva no swelling or erythema, normal fundi and vessels. Sinuses: No Frontal/maxillary tenderness ENT/Mouth: Ext aud canals clear, normal light reflex with TMs without erythema, bulging.  Good dentition. No erythema, swelling, or exudate on post pharynx. Tonsils not swollen or erythematous. Hearing normal.  Neck: Supple, thyroid normal. No bruits Respiratory: Respiratory effort normal, BS equal bilaterally without rales, rhonchi, wheezing or stridor. Cardio: RRR without murmurs, rubs or gallops. Brisk peripheral pulses without edema.  Chest: symmetric, with normal excursions and percussion. Breasts: Symmetric, without lumps, nipple discharge, retractions. Abdomen: Soft, +BS. Non tender, no guarding, rebound,  hernias, masses, or organomegaly. .  Lymphatics: Non tender without lymphadenopathy.  Genitourinary: VULVA: normal appearing vulva with no masses, tenderness or lesions, VAGINA: vaginal erythema , exam limited by body habitus, PAP: Pap smear done today. Musculoskeletal: Full ROM all peripheral extremities,5/5 strength, and normal gait. RIGHT KNEE WITH WELL HEALING VERTICAL SURGICAL SCAR WITHOUT ERYTHEMA, WARMTH, OR DISCHARGE. + EFFUSION IN RIGHT KNEE AND SOME DISTAL EDEMA. NO HARD CORD, NEGATIVE HOMEN'S Skin: Warm, dry without rashes, lesions, ecchymosis.  Neuro: Cranial nerves intact, reflexes equal bilaterally. Normal muscle tone, no cerebellar symptoms. Sensation intact.  Psych: Awake and oriented X 3, normal affect, Insight and Judgment appropriate.   EKG: defer AORTA SCAN: declines   Vicie Mutters 11:36 AM

## 2018-06-18 NOTE — Patient Instructions (Addendum)
VAGINAL DRYNESS OVERVIEW  Vaginal dryness, also known as atrophic vaginitis, is a common condition in postmenopausal women. This condition is also common in women who have had both ovaries removed at the time of hysterectomy.   Some women have uncomfortable symptoms of vaginal dryness, such as pain with sex, burning vaginal discomfort or itching, or abnormal vaginal discharge, while others have no symptoms at all.  VAGINAL DRYNESS CAUSES   Estrogen helps to keep the vagina moist and to maintain thickness of the vaginal lining. Vaginal dryness occurs when the ovaries produce a decreased amount of estrogen. This can occur at certain times in a woman's life, and may be permanent or temporary. Times when less estrogen is made include: ?At the time of menopause. ?After surgical removal of the ovaries, chemotherapy, or radiation therapy of the pelvis for cancer. ?After having a baby, particularly in women who breastfeed. ?While using certain medications, such as danazol, medroxyprogesterone (brand names: Provera or DepoProvera), leuprolide (brand name: Lupron), or nafarelin. When these medications are stopped, estrogen production resumes.  Women who smoke cigarettes have been shown to have an increased risk of an earlier menopause transition as compared to non-smokers. Therefore, atrophic vaginitis symptoms may appear at a younger age in this population.  VAGINAL DRYNESS TREATMENT   There are three treatment options for women with vaginal dryness:  Vaginal lubricants and moisturizers - Vaginal lubricants and moisturizers can be purchased without a prescription. These products do not contain any hormones and have virtually no side effects. - Albolene is found in the facial cleanser section at [facility], [facility], or [facility]. It is a large jar with a blue top. This is the best lubricant for women because it is hypoallergenic. -Natural lubricants, such as olive, avocado or peanut oil, are easily available  products that may be used as a lubricant with sex.  -Vaginal moisturizes (eg, Replens, Moist Again, Vagisil, K-Y Silk-E, and Feminease) are formulated to allow water to be retained in the vaginal tissues. Moisturizers are applied into the vagina three times weekly to allow a continued moisturizing effect. These should not be used just before having sex, as they can be irritating.  Vaginal estrogen - Vaginal estrogen is the most effective treatment option for women with vaginal dryness. Vaginal estrogen must be prescribed by a healthcare provider. Very low doses of vaginal estrogen can be used when it is put into the vagina to treat vaginal dryness. A small amount of estrogen is absorbed into the bloodstream, but only about 100 times less than when using estrogen pills or tablets. As a result, there is a much lower risk of side effects, such as blood clots, breast cancer, and heart attack, compared with other estrogen-containing products (birth control pills, menopausal hormone therapy).   Ospemifene - Ospemifene is a prescription medication that is similar to estrogen, but is not estrogen. In the vaginal tissue, it acts similarly to estrogen. In the breast tissue, it acts as an estrogen blocker. It comes in a pill, and is prescribed for women who want to use an estrogen-like medication for vaginal dryness or painful sex associated with vaginal dryness, but prefer not to use a vaginal medication. The medication may cause hot flashes as a side effect. This type of medication may increase the risk of blood clots or uterine cancer. Further study of ospemifene is needed to evaluate the risk of these complications. This medication has not been tested in women who have had breast cancer or are at a high risk of developing   breast cancer.    Sexual activity - Vaginal estrogen improves vaginal dryness quickly, usually within a few weeks. You may continue to have sex as you treat vaginal dryness because sex itself  can help to keep the vaginal tissues healthy. Vaginal intercourse may help the vaginal tissues by keeping them soft and stretchable and preventing the tissues from shrinking.  If sex continues to be painful despite treatment for vaginal dryness, talk to your healthcare provider.     Lichen Sclerosus Lichen sclerosus is a skin problem. It can happen on any part of the body, but it commonly involves the anal or genital areas. It can cause itching and discomfort in these areas. Treatment can help to control symptoms. When the genital area is affected, getting treatment is important because the condition can cause scarring that may lead to other problems. What are the causes? The cause of this condition is not known. It may be related to an overactive immune system or a lack of certain hormones. Lichen sclerosus is not an infection or a fungus, and it is not passed from one person to another (not contagious). What increases the risk? This condition is more likely to develop in women, usually after menopause. What are the signs or symptoms? Symptoms of this condition include:  Thin, wrinkled, white areas on the skin.  Thickened white areas on the skin.  Red and swollen patches (lesions) on the skin.  Tears or cracks in the skin.  Bruising.  Blood blisters.  Severe itching.  Pain, itching, or burning when urinating. Constipation is also common in people with lichen sclerosus. How is this diagnosed? This condition may be diagnosed with a physical exam. In some cases, a tissue sample (biopsy sample) may be removed to be looked at under a microscope. How is this treated? This condition is usually treated with medicated creams or ointments (topical steroids) that are applied over the affected areas. In some cases, treatment may also include medicines that are taken by mouth. Surgery may be needed in more severe cases that are causing problems such as scarring. Follow these instructions at  home:  Take or use over-the-counter and prescription medicines only as told by your health care provider.  Use creams or ointments as told by your health care provider.  Do not scratch the affected areas of skin.  If you are a woman, be sure to keep the vaginal area as clean and dry as possible.  Clean the affected area of skin gently with water. Avoid using rough towels or toilet paper.  Keep all follow-up visits as told by your health care provider. This is important. Contact a health care provider if:  You have increasing redness, swelling, or pain in the affected area.  You have fluid, blood, or pus coming from the affected area.  You have new lesions on your skin.  You have a fever.  You have pain during sex. Summary  Lichen sclerosus is a skin problem. When the genital area is affected, getting treatment is important because the condition can cause scarring that may lead to other problems.  This condition is usually treated with medicated creams or ointments (topical steroids) that are applied over the affected areas.  Take or use over-the-counter and prescription medicines only as told by your health care provider.  Contact a health care provider if you have new lesions on your skin, have pain during sex, or have increasing redness, swelling, or pain in the affected area.  Keep all follow-up visits  as told by your health care provider. This is important. This information is not intended to replace advice given to you by your health care provider. Make sure you discuss any questions you have with your health care provider. Document Released: 10/17/2010 Document Revised: 10/09/2017 Document Reviewed: 10/09/2017 Elsevier Interactive Patient Education  2019 [employer].

## 2018-06-19 LAB — TSH: TSH: 1.64 mIU/L

## 2018-06-19 LAB — CBC WITH DIFFERENTIAL/PLATELET
Absolute Monocytes: 599 cells/uL (ref 200–950)
Basophils Absolute: 51 cells/uL (ref 0–200)
Basophils Relative: 0.7 %
Eosinophils Absolute: 234 cells/uL (ref 15–500)
Eosinophils Relative: 3.2 %
HCT: 39.8 % (ref 35.0–45.0)
Hemoglobin: 13.2 g/dL (ref 11.7–15.5)
Lymphs Abs: 1475 cells/uL (ref 850–3900)
MCH: 28.6 pg (ref 27.0–33.0)
MCHC: 33.2 g/dL (ref 32.0–36.0)
MCV: 86.1 fL (ref 80.0–100.0)
MPV: 10.8 fL (ref 7.5–12.5)
Monocytes Relative: 8.2 %
Neutro Abs: 4942 cells/uL (ref 1500–7800)
Neutrophils Relative %: 67.7 %
Platelets: 292 10*3/uL (ref 140–400)
RBC: 4.62 10*6/uL (ref 3.80–5.10)
RDW: 13.8 % (ref 11.0–15.0)
Total Lymphocyte: 20.2 %
WBC: 7.3 10*3/uL (ref 3.8–10.8)

## 2018-06-19 LAB — COMPLETE METABOLIC PANEL WITH GFR
AG Ratio: 1.8 (calc) (ref 1.0–2.5)
ALT: 16 U/L (ref 6–29)
AST: 16 U/L (ref 10–35)
Albumin: 3.9 g/dL (ref 3.6–5.1)
Alkaline phosphatase (APISO): 95 U/L (ref 33–130)
BILIRUBIN TOTAL: 0.4 mg/dL (ref 0.2–1.2)
BUN: 12 mg/dL (ref 7–25)
CO2: 24 mmol/L (ref 20–32)
Calcium: 9.1 mg/dL (ref 8.6–10.4)
Chloride: 105 mmol/L (ref 98–110)
Creat: 0.78 mg/dL (ref 0.50–1.05)
GFR, Est African American: 99 mL/min/{1.73_m2} (ref >=60)
GFR, Est Non African American: 86 mL/min/{1.73_m2} (ref >=60)
Globulin: 2.2 g/dL (calc) (ref 1.9–3.7)
Glucose, Bld: 83 mg/dL (ref 65–99)
Potassium: 3.7 mmol/L (ref 3.5–5.3)
SODIUM: 139 mmol/L (ref 135–146)
Total Protein: 6.1 g/dL (ref 6.1–8.1)

## 2018-06-19 LAB — URINALYSIS, ROUTINE W REFLEX MICROSCOPIC
Bilirubin Urine: NEGATIVE
GLUCOSE, UA: NEGATIVE
Hgb urine dipstick: NEGATIVE
Ketones, ur: NEGATIVE
Leukocytes, UA: NEGATIVE
Nitrite: NEGATIVE
PH: 7 (ref 5.0–8.0)
Protein, ur: NEGATIVE
Specific Gravity, Urine: 1.004 (ref 1.001–1.03)

## 2018-06-19 LAB — LIPID PANEL
Cholesterol: 189 mg/dL (ref <200)
HDL: 50 mg/dL — ABNORMAL LOW (ref >50)
LDL Cholesterol (Calc): 111 mg/dL (calc) — ABNORMAL HIGH
Non-HDL Cholesterol (Calc): 139 mg/dL (calc) — ABNORMAL HIGH (ref <130)
Total CHOL/HDL Ratio: 3.8 (calc) (ref <5.0)
Triglycerides: 164 mg/dL — ABNORMAL HIGH (ref <150)

## 2018-06-19 LAB — MAGNESIUM: Magnesium: 1.8 mg/dL (ref 1.5–2.5)

## 2018-06-19 LAB — VITAMIN D 25 HYDROXY (VIT D DEFICIENCY, FRACTURES): Vit D, 25-Hydroxy: 68 ng/mL (ref 30–100)

## 2018-06-19 LAB — MICROALBUMIN / CREATININE URINE RATIO
Creatinine, Urine: 14 mg/dL — ABNORMAL LOW (ref 20–275)
Microalb, Ur: <0.2 mg/dL

## 2018-06-19 LAB — HEMOGLOBIN A1C
EAG (MMOL/L): 6 (calc)
Hgb A1c MFr Bld: 5.4 % of total Hgb (ref <5.7)
Mean Plasma Glucose: 108 (calc)

## 2018-06-22 LAB — CYTOLOGY - PAP
Adequacy: ABSENT
Bacterial vaginitis: NEGATIVE
CANDIDA VAGINITIS: NEGATIVE
Diagnosis: NEGATIVE
HPV: NOT DETECTED

## 2018-06-24 MED ORDER — LOSARTAN POTASSIUM 100 MG PO TABS: 100.0000 mg | ORAL_TABLET | Freq: Every day | ORAL | 1 refills | Status: DC

## 2018-06-24 MED ORDER — LOSARTAN POTASSIUM-HCTZ 100-25 MG PO TABS: 1.0000 | ORAL_TABLET | Freq: Every day | ORAL | 11 refills | Status: DC

## 2018-06-24 MED ORDER — HYDROCHLOROTHIAZIDE 25 MG PO TABS: 25.0000 mg | ORAL_TABLET | Freq: Every day | ORAL | 1 refills | Status: DC

## 2018-06-24 NOTE — Addendum Note (Signed)
Addended by: Vicie Mutters R on: 06/24/2018 08:52 AM   Modules accepted: Orders

## 2018-07-01 DIAGNOSIS — K045 Chronic apical periodontitis: Secondary | ICD-10-CM | POA: Diagnosis not present

## 2018-07-17 MED ORDER — HYDROCHLOROTHIAZIDE 25 MG PO TABS: 25.0000 mg | ORAL_TABLET | Freq: Every day | ORAL | 1 refills | Status: DC

## 2018-07-17 MED ORDER — LOSARTAN POTASSIUM 100 MG PO TABS: 100.0000 mg | ORAL_TABLET | Freq: Every day | ORAL | 1 refills | Status: DC

## 2018-08-03 ENCOUNTER — Encounter: Payer: Self-pay | Admitting: Internal Medicine

## 2018-08-03 DIAGNOSIS — Z1231 Encounter for screening mammogram for malignant neoplasm of breast: Secondary | ICD-10-CM | POA: Diagnosis not present

## 2018-08-03 LAB — HM MAMMOGRAPHY

## 2018-11-24 ENCOUNTER — Other Ambulatory Visit: Payer: Self-pay | Admitting: Internal Medicine

## 2018-11-24 DIAGNOSIS — E079 Disorder of thyroid, unspecified: Secondary | ICD-10-CM

## 2018-11-24 DIAGNOSIS — F3341 Major depressive disorder, recurrent, in partial remission: Secondary | ICD-10-CM

## 2018-12-09 ENCOUNTER — Other Ambulatory Visit: Payer: Self-pay | Admitting: Physician Assistant

## 2018-12-17 NOTE — Progress Notes (Signed)
Assessment and Plan:  Essential hypertension -     CBC with Differential/Platelet -     COMPLETE METABOLIC PANEL WITH GFR -     TSH - continue medications, DASH diet, exercise and monitor at home. Call if greater than 130/80.   Morbid obesity ([place]) - follow up 3 months for progress monitoring - increase veggies, decrease carbs - long discussion about weight loss, diet, and exercise  Other abnormal glucose -     Hemoglobin A1c Discussed disease progression and risks Discussed diet/exercise, weight management and risk modification  Hyperlipidemia, unspecified hyperlipidemia type -     Lipid panel check lipids decrease fatty foods increase activity.   Thyroid disease -     TSH Hypothyroidism-check TSH level, continue medications the same, reminded to take on an empty stomach 30-26mins before food.   Medication management -     Magnesium   Discussed med's effects and SE's. Screening labs and tests as requested with regular follow-up as recommended. Future Appointments  Date Time Provider [place]  06/23/2019 10:00 AM Vicie Mutters, PA-C [facility] None     HPI 58 y.o. female  presents for follow up for HTN, obesity, chol.   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   BMI is Body mass index is 43.85 kg/m., she is working on diet and exercise. Has taken phentermine in the past but can only take a very small amount or she has symptoms, she could not tolerate contrave due to constipation. She states she feels her snacking is in control.  Wt Readings from Last 3 Encounters:  12/21/18 280 lb (127 kg)  06/18/18 272 lb 9.6 oz (123.7 kg)  04/07/18 259 lb (117.5 kg)    Her blood pressure has been controlled at home, today their BP is BP: 126/88 She does not workout but wants to start swimming again.  She denies chest pain, shortness of breath, dizziness.   She is not on cholesterol medication and denies myalgias.  Her cholesterol is at goal. The cholesterol last visit was:   Lab Results  Component Value Date   CHOL 189 06/18/2018   HDL 50 (L) 06/18/2018   LDLCALC 111 (H) 06/18/2018   TRIG 164 (H) 06/18/2018   CHOLHDL 3.8 06/18/2018   Last A1C in the office was:  Lab Results  Component Value Date   HGBA1C 5.4 06/18/2018   Patient is on Vitamin D supplement.   Lab Results  Component Value Date   VD25OH 68 06/18/2018   She is on thyroid medication. Her medication was not changed last visit. Patient denies heat / cold intolerance, nervousness and palpitations.  Lab Results  Component Value Date   TSH 1.64 06/18/2018  .  Current Medications:  Current Outpatient Medications on File Prior to Visit  Medication Sig Dispense Refill  . B Complex Vitamins (B COMPLEX PO) Take by mouth.    . bisoprolol-hydrochlorothiazide (ZIAC) 5-6.25 MG tablet Take 1-2 tabs by mouth daily for BP goal <130/80. 30 tablet 1  . buPROPion (WELLBUTRIN XL) 150 MG 24 hr tablet Take 2 tablets (300 mg total) by mouth daily. 180 tablet 0  . Cholecalciferol (VITAMIN D PO) Take 5,000 Int'l Units by mouth daily.    . diazepam (VALIUM) 5 MG tablet 1/2-1 pill every 8 hours for dizziness/vertigo (Patient taking differently: as needed. 1/2-1 pill every 8 hours for dizziness/vertigo) 30 tablet 0  . hydrochlorothiazide (HYDRODIURIL) 25 MG tablet Take 1  tablet (25 mg total) by mouth daily. 90 tablet 0  . levothyroxine (SYNTHROID) 150 MCG tablet Take 1/2 tablet by mouth on Tuesdays & Thursdays and 1 tablet all other days 90 tablet 0  . losartan (COZAAR) 100 MG tablet Take 1 tablet (100 mg total) by mouth daily. 90 tablet 0  . MAGNESIUM PO Take by mouth.    . Omega-3 Fatty Acids (FISH OIL PO) Take 2,000 mg by mouth daily.    Marland Kitchen OVER THE COUNTER MEDICATION 400 mg daily. Bio Cocumin    . OVER THE COUNTER MEDICATION     . phentermine (ADIPEX-P) 37.5 MG tablet Take 1 tablet (37.5 mg total) by mouth daily before breakfast. 30 tablet 2  .  PREPOPIK 10-3.5-12 MG-GM-GM PACK     . triamcinolone cream (KENALOG) 0.5 % Apply 1 application topically 2 (two) times daily. 80 g 2   No current facility-administered medications on file prior to visit.     Medical History:  Past Medical History:  Diagnosis Date  . Thyroid disease    Allergies Allergies  Allergen Reactions  . Augmentin [Amoxicillin-Pot Clavulanate] Nausea And Vomiting   SURGICAL HISTORY She  has a past surgical history that includes Knee arthroscopy (Right, 2015). FAMILY HISTORY Her family history includes Alzheimer's disease (age of onset: 61) in her maternal grandfather; Alzheimer's disease (age of onset: 13) in her mother; Arthritis in her father; Cancer in her father; Depression in her brother; Gout in her father; Hypertension in her father and mother; Mental illness in her daughter. SOCIAL HISTORY She  reports that she has never smoked. She has never used smokeless tobacco. She reports that she does not drink alcohol or use drugs.  Review of Systems  Constitutional: Negative.   HENT: Negative.   Eyes: Negative.   Respiratory: Negative.   Cardiovascular: Negative.   Gastrointestinal: Negative for constipation.  Genitourinary: Negative.   Musculoskeletal: Negative for back pain, falls, joint pain, myalgias and neck pain.  Skin: Negative.   Neurological: Negative.   Psychiatric/Behavioral: Negative.     Physical Exam: Estimated body mass index is 43.85 kg/m as calculated from the following:   Height as of this encounter: 5\' 7"  (1.702 m).   Weight as of this encounter: 280 lb (127 kg). BP 126/88   Pulse 64   Temp (!) 97.5 F (36.4 C)   Ht 5\' 7"  (1.702 m)   Wt 280 lb (127 kg)   SpO2 97%   BMI 43.85 kg/m  General Appearance: Well nourished, in no apparent distress. Eyes: PERRLA, EOMs, conjunctiva no swelling or erythema, normal fundi and vessels. Sinuses: No Frontal/maxillary tenderness ENT/Mouth: Ext aud canals clear, normal light reflex with  TMs without erythema, bulging.  Good dentition. No erythema, swelling, or exudate on post pharynx. Tonsils not swollen or erythematous. Hearing normal.  Neck: Supple, thyroid normal. No bruits Respiratory: Respiratory effort normal, BS equal bilaterally without rales, rhonchi, wheezing or stridor. Cardio: RRR without murmurs, rubs or gallops. Brisk peripheral pulses without edema.  Chest: symmetric, with normal excursions and percussion. Abdomen: Soft, +BS. Non tender, no guarding, rebound, hernias, masses, or organomegaly. .  Lymphatics: Non tender without lymphadenopathy.  Musculoskeletal: Full ROM all peripheral extremities,5/5 strength, and normal gait. HOMEN'S Skin: Warm, dry without rashes, lesions, ecchymosis.  Neuro: Cranial nerves intact, reflexes equal bilaterally. Normal muscle tone, no cerebellar symptoms. Sensation intact.  Psych: Awake and oriented X 3, normal affect, Insight and Judgment appropriate.    Vicie Mutters 9:56 AM

## 2018-12-21 ENCOUNTER — Encounter: Payer: Self-pay | Admitting: Physician Assistant

## 2018-12-21 ENCOUNTER — Other Ambulatory Visit: Payer: Self-pay

## 2018-12-21 ENCOUNTER — Ambulatory Visit: Payer: 59 | Admitting: Physician Assistant

## 2018-12-21 VITALS — BP 126/88 | HR 64 | Temp 97.5°F | Ht 67.0 in | Wt 280.0 lb

## 2018-12-21 DIAGNOSIS — E785 Hyperlipidemia, unspecified: Secondary | ICD-10-CM

## 2018-12-21 DIAGNOSIS — I10 Essential (primary) hypertension: Secondary | ICD-10-CM | POA: Diagnosis not present

## 2018-12-21 DIAGNOSIS — R7309 Other abnormal glucose: Secondary | ICD-10-CM

## 2018-12-21 DIAGNOSIS — Z20828 Contact with and (suspected) exposure to other viral communicable diseases: Secondary | ICD-10-CM

## 2018-12-21 DIAGNOSIS — E079 Disorder of thyroid, unspecified: Secondary | ICD-10-CM

## 2018-12-21 DIAGNOSIS — Z20822 Contact with and (suspected) exposure to covid-19: Secondary | ICD-10-CM

## 2018-12-21 DIAGNOSIS — Z79899 Other long term (current) drug therapy: Secondary | ICD-10-CM

## 2018-12-21 NOTE — Patient Instructions (Addendum)
I want to challenge you to lose 10 lbs before the next appointment.  That would be about 3 lbs a month!  This is very achievable and I know you can do it.  10 lbs is actually 40 lbs of pressure of your joints, back, hips, knees and ankles.  10lbs may be enough to help your blood pressure and cholesterol for next visit.   Intermittent fasting is more about strategy than starvation. It's meant to reset your body in different ways, hopefully with fitness and nutrition changes as a result.  Like any big switchover, though, results may vary when it comes down to the individual level. What works for your friends may not work for you, or vice versa. That's why it's helpful to play around with variations on intermittent fasting and healthy habits and find what works best for you.  WHAT IS INTERMITTENT FASTING AND WHY DO IT?  Intermittent fasting doesn't involve specific foods, but rather, a strict schedule regarding when you eat. Also called "time-restricted eating," the tactic has been praised for its contribution to weight loss, improved body composition, and decreased cravings. Preliminary research also suggests it may be beneficial for glucose tolerance, hormone regulation, better muscle mass and lower body fat.  Part of its appeal is the simplicity of the effort. Unlike some other trends, there's no calculations to intermittent fasting.  You simply eat within a certain block of time, usually a window of 8-10 hours. In the other big block of time - about 14-16 hours, including when you're asleep - you don't eat anything, not even snacks. You can drink water, coffee, tea or any other beverage that doesn't have calories.  For example, if you like having a late dinner, you might skip breakfast and have your first meal at noon and your last meal of the day at 8 p.m., and then not eat until noon again the next day.  IDEAS FOR GETTING STARTED  If you're new to the strategy, it may be helpful to eat  within the typical circadian rhythm and keep eating within daylight hours. This can be especially beneficial if you're looking at intermittent fasting for weight-loss goals.  So first try only eating between 12pm to 8pm.  Outside of this time you may have water, black coffee, and hot tea. You may not eat it drink anything that has carbs, sugars, OR artificial sugars like diet soda.   Like any major eating and fitness shift, it can take time to find the perfect fit, so don't be afraid to experiment with different options - including ditching intermittent fasting altogether if it's simply not for you. But if it is, you may be surprised by some of the benefits that come along with the strategy.  8 Critical Weight-Loss Tips That Aren't Diet and Exercise  1. STARVE THE DISTRACTIONS  All too often when we eat, we're also multitasking: watching TV, answering emails, scrolling through social media. These habits are detrimental to having a strong, clear, healthy relationship with food, and they can hinder our ability to make dietary changes.  In order to truly focus on what you're eating, how much you're eating, why you're eating those specific foods and, most importantly, how those foods make you feel, you need to starve the distractions. That means when you eat, just eat. Focus on your food, the process it went through to end up on your plate, where it came from and how it nourishes you. With this technique, you're more likely to finish  a meal feeling satiated.  2.  CONSIDER WHAT YOU'RE NOT WILLING TO DO  This might sound counterintuitive, but it can help provide a "why" when motivation is waning. Declare, in writing, what you are unwilling to do, for example "I am unwilling to be the old dad who cannot play sports with my children".  So consider what you're not willing to accept, write it down, and keep it at the ready.  3.  STOP LABELING FOOD "GOOD" AND "BAD"  You've probably heard someone say they  ate something "bad." Maybe you've even said it yourself.  The trouble with 'bad' foods isn't that they'll send you to the grave after a bite or two. The trouble comes when we eat excessive portions of really calorie-dense foods meal after meal, day after day.  Instead of labeling foods as good or bad, think about which foods you can eat a lot of, and which ones you should just eat a little of. Then, plan ways to eat the foods you really like in portions that fit with your overall goals. A good example of this would be having a slice of pizza alongside a club salad with chicken breast, avocado and a bit of dressing. This is vastly different than 3 slices of pizza, 4 breadsticks with cheese sauce and half of a liter of regular soda.  4.  BRUSH YOUR TEETH AFTER YOU EAT  Getting your mindset in order is important, but sometimes small habits can make a big difference. After eating, you still have the taste of food in their mouth, which often causes people to eat more even if they are full or engage in a nibble or two of dessert.  Brushing your teeth will remove the taste of food from your mouth, and the clean, minty freshness will serve as a cue that mealtime is over.  5.  FOCUS ON CROWDING NOT CUTTING  The most common first step during 'dieting' is to cut. We cut our portion sizes down, we cut out 'bad' foods, we cut out entire food groups. This act of cutting puts [place] and our minds into scarcity mode.  When something is off-limits, even if you're able to avoid it for a while, you could end up bingeing on it later because you've gone so long without it. So, instead of cutting, focus on crowding. If you crowd your plate and fill it up with more foods like veggies and protein, it simply allows less room for the other stuff. In other words, shift your focus away from what you can't eat, and celebrate the foods that will help you reach your goals.  6.  TAKE TRACKING A STEP FURTHER  Track what you eat, when  you ate it, how much you ate and how that food made you feel. Being completely honest with yourself and writing down every single thing that passes through your lips will help you start to notice that maybe you actually do snack, possibly take in more sugar than you thought, eat when you're bored rather than just hungry or maybe that you have a habit of snacking before bed while watching TV.  The difference from simply tracking your food intake is you're taking into account how food makes you feel, as well as what you're doing while you're eating. This is about becoming more mindful of what, when and why you eat.  7.  PRIORITIZE GOOD SLEEP  One of the strongest risk factors for being overweight is poor sleep. When you're feeling  tired, you're more likely to choose unhealthy comfort foods and to skip your workout. Additionally, sleep deprivation may slow down your metabolism. [employer]! Therefore, sleeping 7-8 hours per night can help with weight loss without having to change your diet or increase your physical activity. And if you feel you snore and still wake up tired, talk with me about sleep apnea.  8.  SET ASIDE TIME TO DISCONNECT  Just get out there. Disconnect from the electronics and connect to the elements. Not only will this help reduce stress (a major factor in weight gain) by giving your mind a break from the constant stimulation we've all become so accustomed to, but it may also reprogram your brain to connect with yourself and what you're feeling.

## 2018-12-22 LAB — COMPLETE METABOLIC PANEL WITH GFR
AG Ratio: 1.6 (calc) (ref 1.0–2.5)
ALT: 27 U/L (ref 6–29)
AST: 18 U/L (ref 10–35)
Albumin: 3.9 g/dL (ref 3.6–5.1)
Alkaline phosphatase (APISO): 90 U/L (ref 37–153)
BUN: 13 mg/dL (ref 7–25)
CO2: 29 mmol/L (ref 20–32)
Calcium: 9.5 mg/dL (ref 8.6–10.4)
Chloride: 103 mmol/L (ref 98–110)
Creat: 0.72 mg/dL (ref 0.50–1.05)
GFR, Est African American: 108 mL/min/{1.73_m2} (ref >=60)
GFR, Est Non African American: 94 mL/min/{1.73_m2} (ref >=60)
Globulin: 2.4 g/dL (calc) (ref 1.9–3.7)
Glucose, Bld: 97 mg/dL (ref 65–99)
Potassium: 3.9 mmol/L (ref 3.5–5.3)
Sodium: 137 mmol/L (ref 135–146)
Total Bilirubin: 0.4 mg/dL (ref 0.2–1.2)
Total Protein: 6.3 g/dL (ref 6.1–8.1)

## 2018-12-22 LAB — CBC WITH DIFFERENTIAL/PLATELET
Absolute Monocytes: 614 cells/uL (ref 200–950)
Basophils Absolute: 62 cells/uL (ref 0–200)
Basophils Relative: 0.9 %
Eosinophils Absolute: 200 cells/uL (ref 15–500)
Eosinophils Relative: 2.9 %
HCT: 43.3 % (ref 35.0–45.0)
Hemoglobin: 14.6 g/dL (ref 11.7–15.5)
Lymphs Abs: 1366 cells/uL (ref 850–3900)
MCH: 30.2 pg (ref 27.0–33.0)
MCHC: 33.7 g/dL (ref 32.0–36.0)
MCV: 89.6 fL (ref 80.0–100.0)
MPV: 10.6 fL (ref 7.5–12.5)
Monocytes Relative: 8.9 %
Neutro Abs: 4658 cells/uL (ref 1500–7800)
Neutrophils Relative %: 67.5 %
Platelets: 268 10*3/uL (ref 140–400)
RBC: 4.83 10*6/uL (ref 3.80–5.10)
RDW: 14.1 % (ref 11.0–15.0)
Total Lymphocyte: 19.8 %
WBC: 6.9 10*3/uL (ref 3.8–10.8)

## 2018-12-22 LAB — HEMOGLOBIN A1C
Hgb A1c MFr Bld: 5.8 % of total Hgb — ABNORMAL HIGH (ref <5.7)
Mean Plasma Glucose: 120 (calc)
eAG (mmol/L): 6.6 (calc)

## 2018-12-22 LAB — LIPID PANEL
Cholesterol: 178 mg/dL (ref <200)
HDL: 44 mg/dL — ABNORMAL LOW (ref >=50)
LDL Cholesterol (Calc): 104 mg/dL (calc) — ABNORMAL HIGH
Non-HDL Cholesterol (Calc): 134 mg/dL (calc) — ABNORMAL HIGH (ref <130)
Total CHOL/HDL Ratio: 4 (calc) (ref <5.0)
Triglycerides: 189 mg/dL — ABNORMAL HIGH (ref <150)

## 2018-12-22 LAB — MAGNESIUM: Magnesium: 2 mg/dL (ref 1.5–2.5)

## 2018-12-22 LAB — SAR COV2 SEROLOGY (COVID19)AB(IGG),IA: SARS CoV2 AB IGG: NEGATIVE

## 2018-12-22 LAB — TSH: TSH: 0.87 mIU/L (ref 0.40–4.50)

## 2018-12-26 ENCOUNTER — Other Ambulatory Visit: Payer: Self-pay | Admitting: Physician Assistant

## 2019-01-31 ENCOUNTER — Other Ambulatory Visit: Payer: Self-pay | Admitting: Physician Assistant

## 2019-01-31 DIAGNOSIS — F3341 Major depressive disorder, recurrent, in partial remission: Secondary | ICD-10-CM

## 2019-02-01 DIAGNOSIS — H26493 Other secondary cataract, bilateral: Secondary | ICD-10-CM | POA: Insufficient documentation

## 2019-02-01 DIAGNOSIS — H04123 Dry eye syndrome of bilateral lacrimal glands: Secondary | ICD-10-CM | POA: Insufficient documentation

## 2019-02-16 DIAGNOSIS — F3341 Major depressive disorder, recurrent, in partial remission: Secondary | ICD-10-CM

## 2019-02-17 MED ORDER — BUPROPION HCL ER (XL) 300 MG PO TB24: ORAL_TABLET | ORAL | 3 refills | Status: DC

## 2019-03-02 ENCOUNTER — Other Ambulatory Visit: Payer: Self-pay | Admitting: Physician Assistant

## 2019-03-02 DIAGNOSIS — E079 Disorder of thyroid, unspecified: Secondary | ICD-10-CM

## 2019-05-13 ENCOUNTER — Other Ambulatory Visit: Payer: Self-pay | Admitting: Physician Assistant

## 2019-05-13 DIAGNOSIS — E079 Disorder of thyroid, unspecified: Secondary | ICD-10-CM

## 2019-06-23 ENCOUNTER — Encounter: Payer: Self-pay | Admitting: Physician Assistant

## 2019-07-21 DIAGNOSIS — F3341 Major depressive disorder, recurrent, in partial remission: Secondary | ICD-10-CM | POA: Insufficient documentation

## 2019-07-21 NOTE — Progress Notes (Signed)
Complete Physical  She would prefer to not go on a medication, will prefer close follow up and diet/life style modification. Patient is very motivated and has a good outlook.   Assessment and Plan: Encounter for general adult medical examination with abnormal findings 1 year  Morbid obesity ([place]) - follow up 3 months for progress monitoring - increase veggies, decrease carbs - long discussion about weight loss, diet, and exercise  Thyroid disease -     TSH Hypothyroidism-check TSH level, continue medications the same, reminded to take on an empty stomach 30-57mins before food.   Other abnormal glucose -     Hemoglobin A1c -     Urinalysis, Routine w reflex microscopic -     Microalbumin / creatinine urine ratio  Essential hypertension -     CBC with Differential/Platelet -     COMPLETE METABOLIC PANEL WITH GFR -     TSH -     EKG 12-Lead - continue medications, DASH diet, exercise and monitor at home. Call if greater than 130/80.   Hyperlipidemia, unspecified hyperlipidemia type -     Lipid panel -     EKG 12-Lead check lipids decrease fatty foods increase activity.   Depression, major, recurrent, in partial remission (HCC) Continue medications and exercise  Medication management -     Magnesium  Vitamin D deficiency -     VITAMIN D 25 Hydroxy (Vit-D Deficiency, Fractures)  Post-menopausal bleeding -     US PELVIC COMPLETE WITH TRANSVAGINAL; Future - recent normal PAP, ? From vaginal dryness- will get [place] to rule out endometrial cancer/issues  Dyspareunia, female -     US PELVIC COMPLETE WITH TRANSVAGINAL; Future - possible from vaginal atrophy, declines treatments at this time.   Vertigo -     diazepam (VALIUM) 5 MG tablet; 1/2-1 pill every 8 hours for dizziness/vertigo - has long history, uses valium PRN, normal neuro Can do vestibular therapy  Screening, anemia, deficiency, iron -     Iron,Total/Total Iron Binding Cap -     Vitamin B12  Encounter for  screening for COVID-19 -     SAR CoV2 Serology (COVID 19)AB(IGG)IA - had slightly cold, never tested, wants antibody checked.   Discussed med's effects and SE's. Screening labs and tests as requested with regular follow-up as recommended. No future appointments. Needs to schdule  HPI 59 y.o. female  presents for a complete physical.  She has history of vertigo, will use valium PRN for this, has had x thanksgiving, needs new script. Vertigo only with turning over in bed.  She has tinnitus but no hearing loss.   She is having painful intercourse with initial penetration, feels like something is in the way, nothing feels different, no heaviness, [name] august 2019, had blood on the toiliet paper Sept 2020. She has some difficulty cleaning a BM. Over due for colonscopy.    Her blood pressure has been controlled at home, she is on HCTZ and states she has had fatigue, wondering if her potassium is low, she took some over the summer with fatigue and felt better, today their BP is BP: 126/74 She does workout, she is doing swimming at [employer].  She denies chest pain, shortness of breath, dizziness.   She had RTK with Dr. Deanne Coffer 2019 in [place]. She feels she has a good outlook on food and she is exercising.   She is not on cholesterol medication and denies myalgias. Her cholesterol is at goal. The cholesterol last visit was:  Lab Results  Component Value Date   CHOL 170 07/22/2019   HDL 48 (L) 07/22/2019   LDLCALC 98 07/22/2019   TRIG 144 07/22/2019   CHOLHDL 3.5 07/22/2019   Last A1C in the office was:  Lab Results  Component Value Date   HGBA1C 5.5 07/22/2019   Patient is on Vitamin D supplement, 10,000.   Lab Results  Component Value Date   VD25OH 104 (H) 07/22/2019   She could not tolerate contrave due to constipation. She can not tolerate phentermine.  She is on wellbutrin everyday to help with weight.   Wt Readings from Last 3 Encounters:  07/22/19 270 lb (122.5  kg)  12/21/18 280 lb (127 kg)  06/18/18 272 lb 9.6 oz (123.7 kg)   She is on thyroid medication. Her medication was not changed last visit. She is on 1/2 tablet Tues, Thursday and other days 1 tablet, no biotin supplement. Patient denies heat / cold intolerance, nervousness and palpitations.  Lab Results  Component Value Date   TSH 1.31 07/22/2019  .  Current Medications:  Current Outpatient Medications on File Prior to Visit  Medication Sig Dispense Refill  . B Complex Vitamins (B COMPLEX PO) Take by mouth.    . bisoprolol-hydrochlorothiazide (ZIAC) 5-6.25 MG tablet Take 1-2 tabs by mouth daily for BP goal <130/80. 30 tablet 1  . buPROPion (WELLBUTRIN XL) 300 MG 24 hr tablet Take 1 tablet every Morning for Mood 90 tablet 3  . Cholecalciferol (VITAMIN D PO) Take 5,000 Int'l Units by mouth daily.    . hydrochlorothiazide (HYDRODIURIL) 25 MG tablet Take 1 tablet by mouth daily. 90 tablet 0  . levothyroxine (SYNTHROID) 150 MCG tablet Take 1/2 tablet by mouth daily on Tuesday and Thursday, then, take 1 tablet by mouth on all other days. 90 tablet 0  . losartan (COZAAR) 100 MG tablet Take 1 tablet by mouth daily. 90 tablet 0  . MAGNESIUM PO Take by mouth.    . Omega-3 Fatty Acids (FISH OIL PO) Take 2,000 mg by mouth daily.    Marland Kitchen OVER THE COUNTER MEDICATION 400 mg daily. Bio Cocumin    . OVER THE COUNTER MEDICATION     . phentermine (ADIPEX-P) 37.5 MG tablet Take 1/2 to ! Tablet every Morning for Dieting & Weight Loss 30 tablet 2  . PREPOPIK 10-3.5-12 MG-GM-GM PACK     . triamcinolone cream (KENALOG) 0.5 % Apply 1 application topically 2 (two) times daily. 80 g 2   No current facility-administered medications on file prior to visit.   Health Maintenance:   Immunization History  Administered Date(s) Administered  . Influenza Split 05/10/2014  . Td 06/11/2007  . Tdap 06/18/2018   Tetanus: 2020 Pneumovax: Prevnar 13: Flu vaccine: 2020 shingrix discussed  Pap: 06/2018 neg HPV 5  years August of 2019 was last menses, sept 2020 had spotting and has had 1 episode of right lower AB pain, states felt like ovarian cyst.   No [name] recorded. (Menstrual status: Perimenopausal). MGM: 08/03/2018 WNL DEXA: n/A Colonoscopy: [facility] in [place]- 2015 due in 5 years- will get this year EGD:  Medical History:  Past Medical History:  Diagnosis Date  . Thyroid disease    Allergies Allergies  Allergen Reactions  . Augmentin [Amoxicillin-Pot Clavulanate] Nausea And Vomiting   SURGICAL HISTORY She  has a past surgical history that includes Knee arthroscopy (Right, 2015) and Replacement total knee (Right, 04/2018). FAMILY HISTORY Her family history includes Alzheimer's disease (age of onset: 37) in her maternal  grandfather; Alzheimer's disease (age of onset: 89) in her mother; Arthritis in her father; Cancer in her father; Depression in her brother; Gout in her father; Hypertension in her father and mother; Mental illness in her daughter. SOCIAL HISTORY She  reports that she has never smoked. She has never used smokeless tobacco. She reports that she does not drink alcohol or use drugs.  Review of Systems  Constitutional: Negative.   HENT: Negative.   Eyes: Negative.   Respiratory: Negative.   Cardiovascular: Negative.   Gastrointestinal: Positive for constipation.  Genitourinary: Negative.        Painful intercourse  Musculoskeletal: Positive for joint pain (right knee RECENT SURGERY). Negative for back pain, falls, myalgias and neck pain.  Skin: Negative.   Neurological: Negative.   Psychiatric/Behavioral: Negative.     Physical Exam: Estimated body mass index is 42.29 kg/m as calculated from the following:   Height as of this encounter: 5\' 7"  (1.702 m).   Weight as of this encounter: 270 lb (122.5 kg). BP 126/74   Pulse 68   Temp (!) 97.5 F (36.4 C)   Ht 5\' 7"  (1.702 m)   Wt 270 lb (122.5 kg)   SpO2 96%   BMI 42.29 kg/m  General Appearance: Well nourished,  in no apparent distress. Eyes: PERRLA, EOMs, conjunctiva no swelling or erythema, normal fundi and vessels. Sinuses: No Frontal/maxillary tenderness ENT/Mouth: Ext aud canals clear, normal light reflex with TMs without erythema, bulging.  Good dentition. No erythema, swelling, or exudate on post pharynx. Tonsils not swollen or erythematous. Hearing normal.  Neck: Supple, thyroid normal. No bruits Respiratory: Respiratory effort normal, BS equal bilaterally without rales, rhonchi, wheezing or stridor. Cardio: RRR without murmurs, rubs or gallops. Brisk peripheral pulses without edema.  Chest: symmetric, with normal excursions and percussion. Breasts: Symmetric, without lumps, nipple discharge, retractions. Abdomen: Soft, +BS. Non tender, no guarding, rebound, hernias, masses, or organomegaly. .  Lymphatics: Non tender without lymphadenopathy.  Genitourinary: VULVA: normal appearing vulva with no masses, tenderness or lesions, VAGINA: vaginal erythema/atrophy , exam limited by body habitus,no visible cystocele or rectocele Musculoskeletal: Full ROM all peripheral extremities,5/5 strength, and normal gait. Skin: Warm, dry without rashes, lesions, ecchymosis.  Neuro: Cranial nerves intact, reflexes equal bilaterally. Normal muscle tone, no cerebellar symptoms. Sensation intact.  Psych: Awake and oriented X 3, normal affect, Insight and Judgment appropriate.   EKG: WNL AORTA SCAN: declines   Vicie Mutters 1:46 PM

## 2019-07-22 ENCOUNTER — Encounter: Payer: Self-pay | Admitting: Physician Assistant

## 2019-07-22 ENCOUNTER — Ambulatory Visit: Payer: 59 | Admitting: Physician Assistant

## 2019-07-22 ENCOUNTER — Other Ambulatory Visit: Payer: Self-pay

## 2019-07-22 VITALS — BP 126/74 | HR 68 | Temp 97.5°F | Ht 67.0 in | Wt 270.0 lb

## 2019-07-22 DIAGNOSIS — N941 Unspecified dyspareunia: Secondary | ICD-10-CM

## 2019-07-22 DIAGNOSIS — I10 Essential (primary) hypertension: Secondary | ICD-10-CM | POA: Diagnosis not present

## 2019-07-22 DIAGNOSIS — E785 Hyperlipidemia, unspecified: Secondary | ICD-10-CM

## 2019-07-22 DIAGNOSIS — Z0000 Encounter for general adult medical examination without abnormal findings: Secondary | ICD-10-CM | POA: Diagnosis not present

## 2019-07-22 DIAGNOSIS — Z79899 Other long term (current) drug therapy: Secondary | ICD-10-CM

## 2019-07-22 DIAGNOSIS — Z136 Encounter for screening for cardiovascular disorders: Secondary | ICD-10-CM

## 2019-07-22 DIAGNOSIS — Z1152 Encounter for screening for COVID-19: Secondary | ICD-10-CM

## 2019-07-22 DIAGNOSIS — Z130 Encounter for screening for diseases of the blood and blood-forming organs and certain disorders involving the immune mechanism: Secondary | ICD-10-CM

## 2019-07-22 DIAGNOSIS — N950 Postmenopausal bleeding: Secondary | ICD-10-CM

## 2019-07-22 DIAGNOSIS — R42 Dizziness and giddiness: Secondary | ICD-10-CM

## 2019-07-22 DIAGNOSIS — E559 Vitamin D deficiency, unspecified: Secondary | ICD-10-CM

## 2019-07-22 DIAGNOSIS — R7309 Other abnormal glucose: Secondary | ICD-10-CM

## 2019-07-22 DIAGNOSIS — F3341 Major depressive disorder, recurrent, in partial remission: Secondary | ICD-10-CM

## 2019-07-22 DIAGNOSIS — E079 Disorder of thyroid, unspecified: Secondary | ICD-10-CM

## 2019-07-22 DIAGNOSIS — Z0001 Encounter for general adult medical examination with abnormal findings: Secondary | ICD-10-CM

## 2019-07-22 MED ORDER — DIAZEPAM 5 MG PO TABS: ORAL_TABLET | ORAL | 0 refills | Status: DC

## 2019-07-22 NOTE — Patient Instructions (Addendum)
Will check your potassium and magnesium You can get on the HCTZ every other day and monitor your BP  Aims to exercise AT least 10 mins a day, this helps circulate blood and in a study has decreased dementia risk.  Keep your mind engaged! Try meditation and relaxation techniques Do not smoke or drink alcohol If you have hearing loss, get hearing aids.   HYPERTENSION INFORMATION  Monitor your blood pressure at home, please keep a record and bring that in with you to your next office visit.   Go to the ER if any CP, SOB, nausea, dizziness, severe HA, changes vision/speech  Testing/Procedures: HOW TO TAKE YOUR BLOOD PRESSURE:  Rest 5 minutes before taking your blood pressure.  Don't smoke or drink caffeinated beverages for at least 30 minutes before.  Take your blood pressure before (not after) you eat.  Sit comfortably with your back supported and both feet on the floor (don't cross your legs).  Elevate your arm to heart level on a table or a desk.  Use the proper sized cuff. It should fit smoothly and snugly around your bare upper arm. There should be enough room to slip a fingertip under the cuff. The bottom edge of the cuff should be 1 inch above the crease of the elbow.  Due to a recent study, SPRINT, we have changed our goal for the systolic or top blood pressure number. Ideally we want your top number at 120.  In the [facility] Trial, 5000 people were randomized to a goal BP of 120 and 5000 people were randomized to a goal BP of less than 140. The patients with the goal BP at 120 had LESS DEMENTIA, LESS HEART ATTACKS, AND LESS STROKES, AS WELL AS OVERALL DECREASED MORTALITY OR DEATH RATE.   There was another study that showed taking your blood pressure medications at night decrease cardiovascular events.  However if you are on a fluid pill, please take this in the morning.   If you are willing, our goal BP is the top number of 120.  Your most recent BP: BP: 126/74   Take your  medications faithfully as instructed. Maintain a healthy weight. Get at least 150 minutes of aerobic exercise per week. Minimize salt intake. Minimize alcohol intake  DASH Eating Plan DASH stands for "Dietary Approaches to Stop Hypertension." The DASH eating plan is a healthy eating plan that has been shown to reduce high blood pressure (hypertension). Additional health benefits may include reducing the risk of type 2 diabetes mellitus, heart disease, and stroke. The DASH eating plan may also help with weight loss. WHAT DO I NEED TO KNOW ABOUT THE DASH EATING PLAN? For the DASH eating plan, you will follow these general guidelines:  Choose foods with a percent daily value for sodium of less than 5% (as listed on the food label).  Use salt-free seasonings or herbs instead of table salt or sea salt.  Check with your health care provider or pharmacist before using salt substitutes.  Eat lower-sodium products, often labeled as "lower sodium" or "no salt added."  Eat fresh foods.  Eat more vegetables, fruits, and low-fat dairy products.  Choose whole grains. Look for the word "whole" as the first word in the ingredient list.  Choose fish and skinless chicken or [place] more often than red meat. Limit fish, poultry, and meat to 6 oz (170 g) each day.  Limit sweets, desserts, sugars, and sugary drinks.  Choose heart-healthy fats.  Limit cheese to 1 oz (  28 g) per day.  Eat more home-cooked food and less restaurant, buffet, and fast food.  Limit fried foods.  Cook foods using methods other than frying.  Limit canned vegetables. If you do use them, rinse them well to decrease the sodium.  When eating at a restaurant, ask that your food be prepared with less salt, or no salt if possible. WHAT FOODS CAN I EAT? Seek help from a dietitian for individual calorie needs. Grains Whole grain or whole wheat bread. Brown rice. Whole grain or whole wheat pasta. Quinoa, bulgur, and whole grain  cereals. Low-sodium cereals. Corn or whole wheat flour tortillas. Whole grain cornbread. Whole grain crackers. Low-sodium crackers. Vegetables Fresh or frozen vegetables (raw, steamed, roasted, or grilled). Low-sodium or reduced-sodium tomato and vegetable juices. Low-sodium or reduced-sodium tomato sauce and paste. Low-sodium or reduced-sodium canned vegetables.  Fruits All fresh, canned (in natural juice), or frozen fruits. Meat and Other Protein Products Ground beef (85% or leaner), grass-fed beef, or beef trimmed of fat. Skinless chicken or [place]. Ground chicken or [place]. Pork trimmed of fat. All fish and seafood. Eggs. Dried beans, peas, or lentils. Unsalted nuts and seeds. Unsalted canned beans. Dairy Low-fat dairy products, such as skim or 1% milk, 2% or reduced-fat cheeses, low-fat ricotta or cottage cheese, or plain low-fat yogurt. Low-sodium or reduced-sodium cheeses. Fats and Oils Tub margarines without trans fats. Light or reduced-fat mayonnaise and salad dressings (reduced sodium). Avocado. Safflower, olive, or canola oils. Natural peanut or almond butter. Other Unsalted popcorn and pretzels. The items listed above may not be a complete list of recommended foods or beverages. Contact your dietitian for more options. WHAT FOODS ARE NOT RECOMMENDED? Grains White bread. White pasta. White rice. Refined cornbread. Bagels and croissants. Crackers that contain trans fat. Vegetables Creamed or fried vegetables. Vegetables in a cheese sauce. Regular canned vegetables. Regular canned tomato sauce and paste. Regular tomato and vegetable juices. Fruits Dried fruits. Canned fruit in light or heavy syrup. Fruit juice. Meat and Other Protein Products Fatty cuts of meat. Ribs, chicken wings, bacon, sausage, bologna, salami, chitterlings, fatback, hot dogs, bratwurst, and packaged luncheon meats. Salted nuts and seeds. Canned beans with salt. Dairy Whole or 2% milk, cream, half-and-half, and  cream cheese. Whole-fat or sweetened yogurt. Full-fat cheeses or blue cheese. Nondairy creamers and whipped toppings. Processed cheese, cheese spreads, or cheese curds. Condiments Onion and garlic salt, seasoned salt, table salt, and sea salt. Canned and packaged gravies. Worcestershire sauce. Tartar sauce. Barbecue sauce. Teriyaki sauce. Soy sauce, including reduced sodium. Steak sauce. Fish sauce. Oyster sauce. Cocktail sauce. Horseradish. Ketchup and mustard. Meat flavorings and tenderizers. Bouillon cubes. Hot sauce. Tabasco sauce. Marinades. Taco seasonings. Relishes. Fats and Oils Butter, stick margarine, lard, shortening, ghee, and bacon fat. Coconut, palm kernel, or palm oils. Regular salad dressings. Other Pickles and olives. Salted popcorn and pretzels. The items listed above may not be a complete list of foods and beverages to avoid. Contact your dietitian for more information. WHERE CAN I FIND MORE INFORMATION? National Heart, Lung, and Blood Institute: travelstabloid.com Document Released: 05/16/2011 Document Revised: 10/11/2013 Document Reviewed: 03/31/2013 [facility] Patient Information 2015 [place], [place]. This information is not intended to replace advice given to you by your health care provider. Make sure you discuss any questions you have with your health care provider.     Ask insurance and pharmacy about shingrix - new vaccine   Can go to [email or web address].br for more information  Shingrix Vaccination  Two vaccines are licensed and  recommended to prevent shingles in the [place] Zoster vaccine live (ZVL, Zostavax) has been in use since 2006. Recombinant zoster vaccine (RZV, Shingrix), has been in use since 2017 and is recommended by ACIP as the preferred shingles vaccine.  What Everyone Should Know about Shingles Vaccine (Shingrix) One of the Recommended Vaccines by Disease Shingles  vaccination is the only way to protect against shingles and postherpetic neuralgia (PHN), the most common complication from shingles. CDC recommends that healthy adults 50 years and older get two doses of the shingles vaccine called Shingrix (recombinant zoster vaccine), separated by 2 to 6 months, to prevent shingles and the complications from the disease. Your doctor or pharmacist can give you Shingrix as a shot in your upper arm. Shingrix provides strong protection against shingles and PHN. Two doses of Shingrix is more than 90% effective at preventing shingles and PHN. Protection stays above 85% for at least the first four years after you get vaccinated. Shingrix is the preferred vaccine, over Zostavax (zoster vaccine live), a shingles vaccine in use since 2006. Zostavax may still be used to prevent shingles in healthy adults 60 years and older. For example, you could use Zostavax if a person is allergic to Shingrix, prefers Zostavax, or requests immediate vaccination and Shingrix is unavailable. Who Should Get Shingrix? Healthy adults 50 years and older should get two doses of Shingrix, separated by 2 to 6 months. You should get Shingrix even if in the past you . had shingles  . received Zostavax  . are not sure if you had chickenpox There is no maximum age for getting Shingrix. If you had shingles in the past, you can get Shingrix to help prevent future occurrences of the disease. There is no specific length of time that you need to wait after having shingles before you can receive Shingrix, but generally you should make sure the shingles rash has gone away before getting vaccinated. You can get Shingrix whether or not you remember having had chickenpox in the past. Studies show that more than 99% of Americans 40 years and older have had chickenpox, even if they don't remember having the disease. Chickenpox and shingles are related because they are caused by the same virus (varicella zoster virus).  After a person recovers from chickenpox, the virus stays dormant (inactive) in the body. It can reactivate years later and cause shingles. If you had Zostavax in the recent past, you should wait at least eight weeks before getting Shingrix. Talk to your healthcare provider to determine the best time to get Shingrix. Shingrix is available in [employer] and pharmacies. To find doctor's offices or pharmacies near you that offer the vaccine, visit HealthMap Vaccine FinderExternal. If you have questions about Shingrix, talk with your healthcare provider. Vaccine for Those 35 Years and Older  Shingrix reduces the risk of shingles and PHN by more than 90% in people 2 and older. CDC recommends the vaccine for healthy adults 28 and older.  Who Should Not Get Shingrix? You should not get Shingrix if you: . have ever had a severe allergic reaction to any component of the vaccine or after a dose of Shingrix  . tested negative for immunity to varicella zoster virus. If you test negative, you should get chickenpox vaccine.  . currently have shingles  . currently are pregnant or breastfeeding. Women who are pregnant or breastfeeding should wait to get Shingrix.  Marland Kitchen receive specific antiviral drugs (acyclovir, famciclovir, or valacyclovir) 24 hours before vaccination (avoid use of these  antiviral drugs for 14 days after vaccination)- zoster vaccine live only If you have a minor acute (starts suddenly) illness, such as a cold, you may get Shingrix. But if you have a moderate or severe acute illness, you should usually wait until you recover before getting the vaccine. This includes anyone with a temperature of 101.61F or higher. The side effects of the Shingrix are temporary, and usually last 2 to 3 days. While you may experience pain for a few days after getting Shingrix, the pain will be less severe than having shingles and the complications from the disease. How Well Does Shingrix Work? Two doses of  Shingrix provides strong protection against shingles and postherpetic neuralgia (PHN), the most common complication of shingles. . In adults 66 to 57 years old who got two doses, Shingrix was 97% effective in preventing shingles; among adults 70 years and older, Shingrix was 91% effective.  . In adults 59 to 57 years old who got two doses, Shingrix was 91% effective in preventing PHN; among adults 70 years and older, Shingrix was 89% effective. Shingrix protection remained high (more than 85%) in people 70 years and older throughout the four years following vaccination. Since your risk of shingles and PHN increases as you get older, it is important to have strong protection against shingles in your older years. Top of Page  What Are the Possible Side Effects of Shingrix? Studies show that Shingrix is safe. The vaccine helps your body create a strong defense against shingles. As a result, you are likely to have temporary side effects from getting the shots. The side effects may affect your ability to do normal daily activities for 2 to 3 days. Most people got a sore arm with mild or moderate pain after getting Shingrix, and some also had redness and swelling where they got the shot. Some people felt tired, had muscle pain, a headache, shivering, fever, stomach pain, or nausea. About 1 out of 6 people who got Shingrix experienced side effects that prevented them from doing regular activities. Symptoms went away on their own in about 2 to 3 days. Side effects were more common in younger people. You might have a reaction to the first or second dose of Shingrix, or both doses. If you experience side effects, you may choose to take over-the-counter pain medicine such as ibuprofen or acetaminophen. If you experience side effects from Shingrix, you should report them to the Vaccine Adverse Event Reporting System (VAERS). Your doctor might file this report, or you can do it yourself through the VAERS  websiteExternal, or by calling [phone]. If you have any questions about side effects from Shingrix, talk with your doctor. The shingles vaccine does not contain thimerosal (a preservative containing mercury). Top of Page  When Should I See a Doctor Because of the Side Effects I Experience From Shingrix? In clinical trials, Shingrix was not associated with serious adverse events. In fact, serious side effects from vaccines are extremely rare. For example, for every 1 million doses of a vaccine given, only one or two people may have a severe allergic reaction. Signs of an allergic reaction happen within minutes or hours after vaccination and include hives, swelling of the face and throat, difficulty breathing, a fast heartbeat, dizziness, or weakness. If you experience these or any other life-threatening symptoms, see a doctor right away. Shingrix causes a strong response in your immune system, so it may produce short-term side effects more intense than you are used to from other vaccines.  These side effects can be uncomfortable, but they are expected and usually go away on their own in 2 or 3 days. Top of Page  How Can I Pay For Shingrix? There are several ways shingles vaccine may be paid for: Medicare . Medicare Part D plans cover the shingles vaccine, but there may be a cost to you depending on your plan. There may be a copay for the vaccine, or you may need to pay in full then get reimbursed for a certain amount.  . Medicare Part B does not cover the shingles vaccine. Medicaid . Medicaid may or may not cover the vaccine. Contact your insurer to find out. Private health insurance . Many private health insurance plans will cover the vaccine, but there may be a cost to you depending on your plan. Contact your insurer to find out. Vaccine assistance programs . Some pharmaceutical companies provide vaccines to eligible adults who cannot afford them. You may want to check with the vaccine  manufacturer, [employer], about Shingrix. If you do not currently have health insurance, learn more about affordable health coverage optionsExternal. To find doctor's offices or pharmacies near you that offer the vaccine, visit HealthMap Vaccine FinderExternal.

## 2019-07-23 ENCOUNTER — Encounter: Payer: Self-pay | Admitting: Physician Assistant

## 2019-07-23 LAB — CBC WITH DIFFERENTIAL/PLATELET
Absolute Monocytes: 770 cells/uL (ref 200–950)
Basophils Absolute: 60 cells/uL (ref 0–200)
Basophils Relative: 0.6 %
Eosinophils Absolute: 200 cells/uL (ref 15–500)
Eosinophils Relative: 2 %
HCT: 46 % — ABNORMAL HIGH (ref 35.0–45.0)
Hemoglobin: 16 g/dL — ABNORMAL HIGH (ref 11.7–15.5)
Lymphs Abs: 2030 cells/uL (ref 850–3900)
MCH: 31.6 pg (ref 27.0–33.0)
MCHC: 34.8 g/dL (ref 32.0–36.0)
MCV: 90.7 fL (ref 80.0–100.0)
MPV: 11 fL (ref 7.5–12.5)
Monocytes Relative: 7.7 %
Neutro Abs: 6940 cells/uL (ref 1500–7800)
Neutrophils Relative %: 69.4 %
Platelets: 272 10*3/uL (ref 140–400)
RBC: 5.07 10*6/uL (ref 3.80–5.10)
RDW: 13.4 % (ref 11.0–15.0)
Total Lymphocyte: 20.3 %
WBC: 10 10*3/uL (ref 3.8–10.8)

## 2019-07-23 LAB — TSH: TSH: 1.31 mIU/L (ref 0.40–4.50)

## 2019-07-23 LAB — URINALYSIS, ROUTINE W REFLEX MICROSCOPIC
Bilirubin Urine: NEGATIVE
Glucose, UA: NEGATIVE
Hgb urine dipstick: NEGATIVE
Ketones, ur: NEGATIVE
Leukocytes,Ua: NEGATIVE
Nitrite: NEGATIVE
Protein, ur: NEGATIVE
Specific Gravity, Urine: 1.012 (ref 1.001–1.03)
pH: 6 (ref 5.0–8.0)

## 2019-07-23 LAB — COMPLETE METABOLIC PANEL WITH GFR
AG Ratio: 1.9 (calc) (ref 1.0–2.5)
ALT: 48 U/L — ABNORMAL HIGH (ref 6–29)
AST: 24 U/L (ref 10–35)
Albumin: 4.3 g/dL (ref 3.6–5.1)
Alkaline phosphatase (APISO): 84 U/L (ref 37–153)
BUN: 10 mg/dL (ref 7–25)
CO2: 27 mmol/L (ref 20–32)
Calcium: 9.7 mg/dL (ref 8.6–10.4)
Chloride: 102 mmol/L (ref 98–110)
Creat: 0.74 mg/dL (ref 0.50–1.05)
GFR, Est African American: 104 mL/min/{1.73_m2} (ref >=60)
GFR, Est Non African American: 90 mL/min/{1.73_m2} (ref >=60)
Globulin: 2.3 g/dL (calc) (ref 1.9–3.7)
Glucose, Bld: 89 mg/dL (ref 65–99)
Potassium: 3.7 mmol/L (ref 3.5–5.3)
Sodium: 138 mmol/L (ref 135–146)
Total Bilirubin: 0.4 mg/dL (ref 0.2–1.2)
Total Protein: 6.6 g/dL (ref 6.1–8.1)

## 2019-07-23 LAB — HEMOGLOBIN A1C
Hgb A1c MFr Bld: 5.5 % of total Hgb (ref <5.7)
Mean Plasma Glucose: 111 (calc)
eAG (mmol/L): 6.2 (calc)

## 2019-07-23 LAB — LIPID PANEL
Cholesterol: 170 mg/dL (ref <200)
HDL: 48 mg/dL — ABNORMAL LOW (ref >=50)
LDL Cholesterol (Calc): 98 mg/dL (calc)
Non-HDL Cholesterol (Calc): 122 mg/dL (calc) (ref <130)
Total CHOL/HDL Ratio: 3.5 (calc) (ref <5.0)
Triglycerides: 144 mg/dL (ref <150)

## 2019-07-23 LAB — MICROALBUMIN / CREATININE URINE RATIO
Creatinine, Urine: 92 mg/dL (ref 20–275)
Microalb Creat Ratio: 3 mcg/mg creat (ref <30)
Microalb, Ur: 0.3 mg/dL

## 2019-07-23 LAB — SAR COV2 SEROLOGY (COVID19)AB(IGG),IA: SARS CoV2 AB IGG: NEGATIVE

## 2019-07-23 LAB — VITAMIN B12: Vitamin B-12: 403 pg/mL (ref 200–1100)

## 2019-07-23 LAB — IRON, TOTAL/TOTAL IRON BINDING CAP
%SAT: 26 % (calc) (ref 16–45)
Iron: 82 ug/dL (ref 45–160)
TIBC: 310 mcg/dL (calc) (ref 250–450)

## 2019-07-23 LAB — VITAMIN D 25 HYDROXY (VIT D DEFICIENCY, FRACTURES): Vit D, 25-Hydroxy: 104 ng/mL — ABNORMAL HIGH (ref 30–100)

## 2019-07-23 LAB — MAGNESIUM: Magnesium: 1.9 mg/dL (ref 1.5–2.5)

## 2019-08-08 ENCOUNTER — Other Ambulatory Visit: Payer: Self-pay | Admitting: Physician Assistant

## 2019-08-09 ENCOUNTER — Ambulatory Visit
Admission: RE | Admit: 2019-08-09 | Discharge: 2019-08-09 | Disposition: A | Discharge status: Home / Self Care | Payer: 59 | Source: Ambulatory Visit | Attending: Physician Assistant | Admitting: Physician Assistant

## 2019-08-09 DIAGNOSIS — N950 Postmenopausal bleeding: Secondary | ICD-10-CM

## 2019-08-09 DIAGNOSIS — N941 Unspecified dyspareunia: Secondary | ICD-10-CM

## 2019-09-06 LAB — HM MAMMOGRAPHY

## 2019-09-08 ENCOUNTER — Other Ambulatory Visit: Payer: Self-pay | Admitting: Physician Assistant

## 2019-09-08 DIAGNOSIS — E079 Disorder of thyroid, unspecified: Secondary | ICD-10-CM

## 2019-09-15 ENCOUNTER — Encounter: Payer: Self-pay | Admitting: Internal Medicine

## 2019-09-20 ENCOUNTER — Ambulatory Visit: Payer: 59 | Attending: Internal Medicine

## 2019-09-20 DIAGNOSIS — Z23 Encounter for immunization: Secondary | ICD-10-CM

## 2019-09-20 NOTE — Progress Notes (Signed)
   Covid-19 Vaccination Clinic  Name:  Jenny Arroyo    MRN: [id number] DOB: 02-Feb-1961  09/20/2019  Ms. Jenny Arroyo was observed post Covid-19 immunization for 15 minutes without incident. She was provided with Vaccine Information Sheet and instruction to access the V-Safe system.   Ms. Jenny Arroyo was instructed to call 911 with any severe reactions post vaccine: Marland Kitchen Difficulty breathing  . Swelling of face and throat  . A fast heartbeat  . A bad rash all over body  . Dizziness and weakness   Immunizations Administered    Name Date Dose VIS Date Route   Pfizer COVID-19 Vaccine 09/20/2019  1:27 PM 0.3 mL 05/21/2019 Intramuscular   Manufacturer: [employer]   Lot: [id number]   [place]: [id number]

## 2019-10-11 ENCOUNTER — Ambulatory Visit: Payer: 59 | Attending: Internal Medicine

## 2019-10-11 DIAGNOSIS — Z23 Encounter for immunization: Secondary | ICD-10-CM

## 2019-10-11 NOTE — Progress Notes (Signed)
   Covid-19 Vaccination Clinic  Name:  Keonna Froman    MRN: [id number] DOB: July 29, 1960  10/11/2019  Ms. Khouri was observed post Covid-19 immunization for 15 minutes without incident. She was provided with Vaccine Information Sheet and instruction to access the V-Safe system.   Ms. Rasmusson was instructed to call 911 with any severe reactions post vaccine: Marland Kitchen Difficulty breathing  . Swelling of face and throat  . A fast heartbeat  . A bad rash all over body  . Dizziness and weakness   Immunizations Administered    Name Date Dose VIS Date Route   Pfizer COVID-19 Vaccine 10/11/2019  2:42 PM 0.3 mL 08/04/2018 Intramuscular   Manufacturer: [place]   Lot: [id number]   [place]: [id number]

## 2019-11-06 ENCOUNTER — Other Ambulatory Visit: Payer: Self-pay | Admitting: Physician Assistant

## 2019-11-18 NOTE — Telephone Encounter (Signed)
-----   Message from Liane Comber, NP sent at 11/06/2019  7:43 AM EDT ----- Regarding: needs future follow up OVs Going through refills for Estill Bamberg noted this patient has no follow up appointments scheduled - at minimum would need a 6 month follow up due in August and a CPE due 1 year from last in Feb 2022. Please advise our office policy is no med refills without a future OV scheduled. Thanks!

## 2019-12-06 ENCOUNTER — Other Ambulatory Visit: Payer: Self-pay | Admitting: Internal Medicine

## 2019-12-06 DIAGNOSIS — E079 Disorder of thyroid, unspecified: Secondary | ICD-10-CM

## 2019-12-06 MED ORDER — LEVOTHYROXINE SODIUM 150 MCG PO TABS: ORAL_TABLET | ORAL | 0 refills | Status: DC

## 2020-01-17 NOTE — Progress Notes (Signed)
Assessment and Plan:  Essential hypertension -     CBC with Differential/Platelet -     COMPLETE METABOLIC PANEL WITH GFR -     TSH - continue medications, DASH diet, exercise and monitor at home. Call if greater than 130/80.   Morbid obesity ([place]) - follow up 3 months for progress monitoring - increase veggies, decrease carbs - long discussion about weight loss, diet, and exercise  Other abnormal glucose -     Hemoglobin A1c Discussed disease progression and risks Discussed diet/exercise, weight management and risk modification  Hyperlipidemia, unspecified hyperlipidemia type -     Lipid panel check lipids decrease fatty foods increase activity.   Thyroid disease -     TSH Hypothyroidism-check TSH level, continue medications the same, reminded to take on an empty stomach 30-78mins before food.   Medication management -     Magnesium   Discussed med's effects and SE's. Screening labs and tests as requested with regular follow-up as recommended. No future appointments.   HPI 59 y.o. female  presents for follow up for HTN, obesity, chol.   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 Could not tolerate phentermine due to symptoms, she could not tolerate contrave due to constipation. She states she feels her snacking is in control.  Wt Readings from Last 3 Encounters:  01/19/20 264 lb (119.7 kg)  07/22/19 270 lb (122.5 kg)  12/21/18 280 lb (127 kg)    Her blood pressure has been controlled at home, today their BP is BP: 130/70 She does not workout but wants to start swimming again.  She denies chest pain, shortness of breath, dizziness.   She is not on cholesterol medication and denies myalgias. Her cholesterol is at goal. The cholesterol last visit was:   Lab Results  Component Value Date   CHOL 170 07/22/2019   HDL 48 (L) 07/22/2019   LDLCALC 98 07/22/2019   TRIG 144  07/22/2019   CHOLHDL 3.5 07/22/2019   Last A1C in the office was:  Lab Results  Component Value Date   HGBA1C 5.5 07/22/2019   Patient is on Vitamin D supplement, she has 10,000 2-3 x a week Lab Results  Component Value Date   VD25OH 104 (H) 07/22/2019   She is on thyroid medication. Her medication was not changed last visit. Patient denies heat / cold intolerance, nervousness and palpitations.  Lab Results  Component Value Date   TSH 1.31 07/22/2019  .  Current Medications:  Current Outpatient Medications on File Prior to Visit  Medication Sig Dispense Refill   B Complex Vitamins (B COMPLEX PO) Take by mouth.     bisoprolol-hydrochlorothiazide (ZIAC) 5-6.25 MG tablet Take 1-2 tabs by mouth daily for BP goal <130/80. 30 tablet 1   Cholecalciferol (VITAMIN D PO) Take 5,000 Int'l Units by mouth daily.     diazepam (VALIUM) 5 MG tablet 1/2-1 pill every 8 hours for dizziness/vertigo 30 tablet 0   hydrochlorothiazide (HYDRODIURIL) 25 MG tablet Take 1 tablet by mouth daily. 90 tablet 0   levothyroxine (SYNTHROID) 150 MCG tablet Take 1/2 tablet  on Tuesday and Thursday  &  take 1 tablet  on all other days - Take  on an empty stomach with only water for 30 minutes & no Antacid meds, Calcium or Magnesium for 4 hours & avoid Biotin 90 tablet 0   losartan (COZAAR) 100  MG tablet Take 1 tablet by mouth daily. 90 tablet 0   MAGNESIUM PO Take by mouth.     Omega-3 Fatty Acids (FISH OIL PO) Take 2,000 mg by mouth daily.     OVER THE COUNTER MEDICATION 400 mg daily. Bio Cocumin     OVER THE COUNTER MEDICATION      phentermine (ADIPEX-P) 37.5 MG tablet Take 1/2 to ! Tablet every Morning for Dieting & Weight Loss 30 tablet 2   PREPOPIK 10-3.5-12 MG-GM-GM PACK      triamcinolone cream (KENALOG) 0.5 % Apply 1 application topically 2 (two) times daily. 80 g 2   No current facility-administered medications on file prior to visit.    Medical History:  Past Medical History:  Diagnosis  Date   Thyroid disease    Allergies Allergies  Allergen Reactions   Augmentin [Amoxicillin-Pot Clavulanate] Nausea And Vomiting   SURGICAL HISTORY She  has a past surgical history that includes Knee arthroscopy (Right, 2015) and Replacement total knee (Right, 04/2018). FAMILY HISTORY Her family history includes Alzheimer's disease (age of onset: 78) in her maternal grandfather; Alzheimer's disease (age of onset: 70) in her mother; Arthritis in her father; Cancer in her father; Depression in her brother; Gout in her father; Hypertension in her father and mother; Mental illness in her daughter. SOCIAL HISTORY She  reports that she has never smoked. She has never used smokeless tobacco. She reports that she does not drink alcohol and does not use drugs.  Review of Systems  Constitutional: Negative.   HENT: Negative.   Eyes: Negative.   Respiratory: Negative.   Cardiovascular: Negative.   Gastrointestinal: Negative for constipation.  Genitourinary: Negative.   Musculoskeletal: Negative for back pain, falls, joint pain, myalgias and neck pain.  Skin: Negative.   Neurological: Negative.   Psychiatric/Behavioral: Negative.     Physical Exam: Estimated body mass index is 41.35 kg/m as calculated from the following:   Height as of 07/22/19: 5\' 7"  (1.702 m).   Weight as of this encounter: 264 lb (119.7 kg). BP 130/70    Pulse 84    Temp (!) 97.5 F (36.4 C)    Wt 264 lb (119.7 kg)    SpO2 96%    BMI 41.35 kg/m  General Appearance: Well nourished, in no apparent distress. Eyes: PERRLA, EOMs, conjunctiva no swelling or erythema, normal fundi and vessels. Sinuses: No Frontal/maxillary tenderness ENT/Mouth: Ext aud canals clear, normal light reflex with TMs without erythema, bulging.  Good dentition. No erythema, swelling, or exudate on post pharynx. Tonsils not swollen or erythematous. Hearing normal.  Neck: Supple, thyroid normal. No bruits Respiratory: Respiratory effort normal, BS  equal bilaterally without rales, rhonchi, wheezing or stridor. Cardio: RRR without murmurs, rubs or gallops. Brisk peripheral pulses without edema.  Chest: symmetric, with normal excursions and percussion. Abdomen: Soft, +BS. Non tender, no guarding, rebound, hernias, masses, or organomegaly. .  Lymphatics: Non tender without lymphadenopathy.  Musculoskeletal: Full ROM all peripheral extremities,5/5 strength, and normal gait.  Skin: Warm, dry without rashes, lesions, ecchymosis.  Neuro: Cranial nerves intact, reflexes equal bilaterally. Normal muscle tone, no cerebellar symptoms. Sensation intact.  Psych: Awake and oriented X 3, normal affect, Insight and Judgment appropriate.    Vicie Mutters 3:45 PM

## 2020-01-19 ENCOUNTER — Encounter: Payer: Self-pay | Admitting: Physician Assistant

## 2020-01-19 ENCOUNTER — Other Ambulatory Visit: Payer: Self-pay | Admitting: Physician Assistant

## 2020-01-19 ENCOUNTER — Other Ambulatory Visit: Payer: Self-pay

## 2020-01-19 ENCOUNTER — Ambulatory Visit: Payer: 59 | Admitting: Physician Assistant

## 2020-01-19 DIAGNOSIS — E559 Vitamin D deficiency, unspecified: Secondary | ICD-10-CM

## 2020-01-19 DIAGNOSIS — I10 Essential (primary) hypertension: Secondary | ICD-10-CM

## 2020-01-19 DIAGNOSIS — E785 Hyperlipidemia, unspecified: Secondary | ICD-10-CM

## 2020-01-19 DIAGNOSIS — F3341 Major depressive disorder, recurrent, in partial remission: Secondary | ICD-10-CM

## 2020-01-19 DIAGNOSIS — R7309 Other abnormal glucose: Secondary | ICD-10-CM

## 2020-01-19 DIAGNOSIS — E079 Disorder of thyroid, unspecified: Secondary | ICD-10-CM

## 2020-01-19 DIAGNOSIS — Z79899 Other long term (current) drug therapy: Secondary | ICD-10-CM

## 2020-01-19 MED ORDER — WEGOVY 0.25 MG/0.5ML ~~LOC~~ SOAJ: 0.2500 mg | SUBCUTANEOUS | 0 refills | Status: DC

## 2020-01-19 MED ORDER — WEGOVY 0.5 MG/0.5ML ~~LOC~~ SOAJ: 0.5000 mg | SUBCUTANEOUS | 0 refills | Status: DC

## 2020-01-19 MED ORDER — BUPROPION HCL ER (XL) 300 MG PO TB24: ORAL_TABLET | ORAL | 3 refills | Status: DC

## 2020-01-19 NOTE — Patient Instructions (Signed)
You need to go to [email or web address] to get the savings card.  Check the box halfway down the page and then click on 'get card now'.   Then you can print, download, or email the savings card.  You will have to go online to register and activate the card before going to pharmacy.  [facility], [facility], and [facility] seem to have a good system down so I suggest taking it to one of those pharamcy's.   We will start you on the 0.25mg  once weekly for 4 weeks Then you can do 0.5mg  once weekly for 4 weeks Then you can do the 1 mg once weekly for 4 weeks Then you can do 1.7mg  once weekly for 4 weeks Then you can do 2.4mg  once weekly and stay on this dose.   If you get nausea stay on the tolerated dose x 1-2 more weeks and then try to move up  The main issues with this is the nausea Eat small frequent meals and avoid fatty foods.   General eating tips  What to Avoid . Avoid added sugars o Often added sugar can be found in processed foods such as many condiments, dry cereals, cakes, cookies, chips, crisps, crackers, candies, sweetened drinks, etc.  o Read labels and AVOID/DECREASE use of foods with the following in their ingredient list: Sugar, fructose, high fructose corn syrup, sucrose, glucose, maltose, dextrose, molasses, cane sugar, brown sugar, any type of syrup, agave nectar, etc.   . Avoid snacking in between meals- drink water or if you feel you need a snack, pick a high water content snack such as cucumbers, watermelon, or any veggie.  Marland Kitchen Avoid foods made with flour o If you are going to eat food made with flour, choose those made with whole-grains; and, minimize your consumption as much as is tolerable . Avoid processed foods o These foods are generally stocked in the middle of the grocery store.  o Focus on shopping on the perimeter of the grocery.  What to Include . Vegetables o GREEN LEAFY VEGETABLES: Kale, spinach, mustard greens, collard greens, cabbage, broccoli, etc. o OTHER:  Asparagus, cauliflower, eggplant, carrots, peas, Brussel sprouts, tomatoes, bell peppers, zucchini, beets, cucumbers, etc. . Grains, seeds, and legumes o Beans: kidney beans, black eyed peas, garbanzo beans, black beans, pinto beans, etc. o Whole, unrefined grains: brown rice, barley, bulgur, oatmeal, etc. . Healthy fats  o Avoid highly processed fats such as vegetable oil o Examples of healthy fats: avocado, olives, virgin olive oil, dark chocolate (?72% Cocoa), nuts (peanuts, almonds, walnuts, cashews, pecans, etc.) o Please still do small amount of these healthy fats, they are dense in calories.  . Low - Moderate Intake of Animal Sources of Protein o Meat sources: chicken, [place], salmon, tuna. Limit to 4 ounces of meat at one time or the size of your palm. o Consider limiting dairy sources, but when choosing dairy focus on: PLAIN [place] yogurt, cottage cheese, high-protein milk . Fruit o Choose berries

## 2020-01-19 NOTE — Progress Notes (Signed)
Future Appointments  Date Time Provider [place]  01/19/2020  3:45 PM Vicie Mutters, PA-C [id number] None

## 2020-01-20 LAB — COMPLETE METABOLIC PANEL WITH GFR
AG Ratio: 2 (calc) (ref 1.0–2.5)
ALT: 49 U/L — ABNORMAL HIGH (ref 6–29)
AST: 29 U/L (ref 10–35)
Albumin: 4.3 g/dL (ref 3.6–5.1)
Alkaline phosphatase (APISO): 98 U/L (ref 37–153)
BUN: 14 mg/dL (ref 7–25)
CO2: 29 mmol/L (ref 20–32)
Calcium: 10 mg/dL (ref 8.6–10.4)
Chloride: 103 mmol/L (ref 98–110)
Creat: 0.86 mg/dL (ref 0.50–1.05)
GFR, Est African American: 87 mL/min/{1.73_m2} (ref >=60)
GFR, Est Non African American: 75 mL/min/{1.73_m2} (ref >=60)
Globulin: 2.1 g/dL (calc) (ref 1.9–3.7)
Glucose, Bld: 78 mg/dL (ref 65–99)
Potassium: 3.9 mmol/L (ref 3.5–5.3)
Sodium: 140 mmol/L (ref 135–146)
Total Bilirubin: 0.5 mg/dL (ref 0.2–1.2)
Total Protein: 6.4 g/dL (ref 6.1–8.1)

## 2020-01-20 LAB — CBC WITH DIFFERENTIAL/PLATELET
Absolute Monocytes: 813 cells/uL (ref 200–950)
Basophils Absolute: 50 cells/uL (ref 0–200)
Basophils Relative: 0.6 %
Eosinophils Absolute: 183 cells/uL (ref 15–500)
Eosinophils Relative: 2.2 %
HCT: 46.3 % — ABNORMAL HIGH (ref 35.0–45.0)
Hemoglobin: 15.6 g/dL — ABNORMAL HIGH (ref 11.7–15.5)
Lymphs Abs: 1693 cells/uL (ref 850–3900)
MCH: 31.3 pg (ref 27.0–33.0)
MCHC: 33.7 g/dL (ref 32.0–36.0)
MCV: 93 fL (ref 80.0–100.0)
MPV: 10.9 fL (ref 7.5–12.5)
Monocytes Relative: 9.8 %
Neutro Abs: 5561 cells/uL (ref 1500–7800)
Neutrophils Relative %: 67 %
Platelets: 272 10*3/uL (ref 140–400)
RBC: 4.98 10*6/uL (ref 3.80–5.10)
RDW: 13.5 % (ref 11.0–15.0)
Total Lymphocyte: 20.4 %
WBC: 8.3 10*3/uL (ref 3.8–10.8)

## 2020-01-20 LAB — LIPID PANEL
Cholesterol: 186 mg/dL (ref <200)
HDL: 47 mg/dL — ABNORMAL LOW (ref >=50)
LDL Cholesterol (Calc): 116 mg/dL (calc) — ABNORMAL HIGH
Non-HDL Cholesterol (Calc): 139 mg/dL (calc) — ABNORMAL HIGH (ref <130)
Total CHOL/HDL Ratio: 4 (calc) (ref <5.0)
Triglycerides: 124 mg/dL (ref <150)

## 2020-01-20 LAB — MAGNESIUM: Magnesium: 2.1 mg/dL (ref 1.5–2.5)

## 2020-01-20 LAB — VITAMIN D 25 HYDROXY (VIT D DEFICIENCY, FRACTURES): Vit D, 25-Hydroxy: 96 ng/mL (ref 30–100)

## 2020-01-20 LAB — TSH: TSH: 0.97 mIU/L (ref 0.40–4.50)

## 2020-01-21 ENCOUNTER — Other Ambulatory Visit: Payer: Self-pay | Admitting: Physician Assistant

## 2020-02-04 ENCOUNTER — Other Ambulatory Visit: Payer: Self-pay | Admitting: Adult Health

## 2020-04-04 ENCOUNTER — Other Ambulatory Visit: Payer: Self-pay | Admitting: Internal Medicine

## 2020-04-04 DIAGNOSIS — E079 Disorder of thyroid, unspecified: Secondary | ICD-10-CM

## 2020-04-05 ENCOUNTER — Other Ambulatory Visit: Payer: Self-pay

## 2020-04-05 DIAGNOSIS — E079 Disorder of thyroid, unspecified: Secondary | ICD-10-CM

## 2020-04-05 MED ORDER — LEVOTHYROXINE SODIUM 150 MCG PO TABS: ORAL_TABLET | ORAL | 0 refills | Status: DC

## 2020-04-25 ENCOUNTER — Ambulatory Visit: Payer: 59 | Admitting: Adult Health Nurse Practitioner

## 2020-05-04 ENCOUNTER — Other Ambulatory Visit: Payer: Self-pay | Admitting: Adult Health Nurse Practitioner

## 2020-07-04 ENCOUNTER — Other Ambulatory Visit: Payer: Self-pay | Admitting: Internal Medicine

## 2020-07-04 DIAGNOSIS — E079 Disorder of thyroid, unspecified: Secondary | ICD-10-CM

## 2020-07-04 MED ORDER — LEVOTHYROXINE SODIUM 150 MCG PO TABS: ORAL_TABLET | ORAL | 0 refills | Status: DC

## 2020-07-10 DIAGNOSIS — D23122 Other benign neoplasm of skin of left lower eyelid, including canthus: Secondary | ICD-10-CM | POA: Diagnosis not present

## 2020-07-28 ENCOUNTER — Encounter: Payer: Self-pay | Admitting: Gastroenterology

## 2020-07-31 ENCOUNTER — Encounter: Payer: 59 | Admitting: Adult Health Nurse Practitioner

## 2020-08-02 DIAGNOSIS — M9901 Segmental and somatic dysfunction of cervical region: Secondary | ICD-10-CM | POA: Diagnosis not present

## 2020-08-02 DIAGNOSIS — M5032 Other cervical disc degeneration, mid-cervical region, unspecified level: Secondary | ICD-10-CM | POA: Diagnosis not present

## 2020-08-02 DIAGNOSIS — M9902 Segmental and somatic dysfunction of thoracic region: Secondary | ICD-10-CM | POA: Diagnosis not present

## 2020-08-02 DIAGNOSIS — M530 Cervicocranial syndrome: Secondary | ICD-10-CM | POA: Diagnosis not present

## 2020-08-03 DIAGNOSIS — M5032 Other cervical disc degeneration, mid-cervical region, unspecified level: Secondary | ICD-10-CM | POA: Diagnosis not present

## 2020-08-03 DIAGNOSIS — M530 Cervicocranial syndrome: Secondary | ICD-10-CM | POA: Diagnosis not present

## 2020-08-03 DIAGNOSIS — M9902 Segmental and somatic dysfunction of thoracic region: Secondary | ICD-10-CM | POA: Diagnosis not present

## 2020-08-03 DIAGNOSIS — M9901 Segmental and somatic dysfunction of cervical region: Secondary | ICD-10-CM | POA: Diagnosis not present

## 2020-08-07 ENCOUNTER — Other Ambulatory Visit: Payer: Self-pay | Admitting: Internal Medicine

## 2020-08-07 DIAGNOSIS — M9901 Segmental and somatic dysfunction of cervical region: Secondary | ICD-10-CM | POA: Diagnosis not present

## 2020-08-07 DIAGNOSIS — M9902 Segmental and somatic dysfunction of thoracic region: Secondary | ICD-10-CM | POA: Diagnosis not present

## 2020-08-07 DIAGNOSIS — M530 Cervicocranial syndrome: Secondary | ICD-10-CM | POA: Diagnosis not present

## 2020-08-07 DIAGNOSIS — M5032 Other cervical disc degeneration, mid-cervical region, unspecified level: Secondary | ICD-10-CM | POA: Diagnosis not present

## 2020-08-09 ENCOUNTER — Other Ambulatory Visit: Payer: Self-pay

## 2020-08-09 ENCOUNTER — Encounter: Payer: Self-pay | Admitting: Adult Health Nurse Practitioner

## 2020-08-09 ENCOUNTER — Ambulatory Visit (INDEPENDENT_AMBULATORY_CARE_PROVIDER_SITE_OTHER): Payer: BC Managed Care – PPO | Admitting: Adult Health Nurse Practitioner

## 2020-08-09 VITALS — BP 136/70 | HR 76 | Temp 97.7°F | Ht 68.0 in | Wt 268.0 lb

## 2020-08-09 DIAGNOSIS — R7309 Other abnormal glucose: Secondary | ICD-10-CM

## 2020-08-09 DIAGNOSIS — Z13220 Encounter for screening for lipoid disorders: Secondary | ICD-10-CM

## 2020-08-09 DIAGNOSIS — Z1321 Encounter for screening for nutritional disorder: Secondary | ICD-10-CM

## 2020-08-09 DIAGNOSIS — I10 Essential (primary) hypertension: Secondary | ICD-10-CM

## 2020-08-09 DIAGNOSIS — E079 Disorder of thyroid, unspecified: Secondary | ICD-10-CM

## 2020-08-09 DIAGNOSIS — Z136 Encounter for screening for cardiovascular disorders: Secondary | ICD-10-CM

## 2020-08-09 DIAGNOSIS — E559 Vitamin D deficiency, unspecified: Secondary | ICD-10-CM

## 2020-08-09 DIAGNOSIS — Z79899 Other long term (current) drug therapy: Secondary | ICD-10-CM

## 2020-08-09 DIAGNOSIS — L570 Actinic keratosis: Secondary | ICD-10-CM

## 2020-08-09 DIAGNOSIS — Z20822 Contact with and (suspected) exposure to covid-19: Secondary | ICD-10-CM

## 2020-08-09 DIAGNOSIS — M79671 Pain in right foot: Secondary | ICD-10-CM

## 2020-08-09 DIAGNOSIS — M79672 Pain in left foot: Secondary | ICD-10-CM

## 2020-08-09 DIAGNOSIS — Z0001 Encounter for general adult medical examination with abnormal findings: Secondary | ICD-10-CM

## 2020-08-09 DIAGNOSIS — Z1329 Encounter for screening for other suspected endocrine disorder: Secondary | ICD-10-CM

## 2020-08-09 DIAGNOSIS — Z0000 Encounter for general adult medical examination without abnormal findings: Secondary | ICD-10-CM | POA: Diagnosis not present

## 2020-08-09 DIAGNOSIS — N941 Unspecified dyspareunia: Secondary | ICD-10-CM

## 2020-08-09 DIAGNOSIS — F3341 Major depressive disorder, recurrent, in partial remission: Secondary | ICD-10-CM

## 2020-08-09 DIAGNOSIS — Z1389 Encounter for screening for other disorder: Secondary | ICD-10-CM

## 2020-08-09 DIAGNOSIS — Z130 Encounter for screening for diseases of the blood and blood-forming organs and certain disorders involving the immune mechanism: Secondary | ICD-10-CM

## 2020-08-09 DIAGNOSIS — Z131 Encounter for screening for diabetes mellitus: Secondary | ICD-10-CM

## 2020-08-09 DIAGNOSIS — E785 Hyperlipidemia, unspecified: Secondary | ICD-10-CM

## 2020-08-09 DIAGNOSIS — R42 Dizziness and giddiness: Secondary | ICD-10-CM

## 2020-08-09 NOTE — Patient Instructions (Addendum)
We will place a referal to dermatology for you for full skin check.    Do water bottle frozen and roll your foot on it Get inserts for your plantar fasciitis Do stretches at night You can also try antiinflammatory Ibuprofen 600mg  every 6 hours OR 800mg  every 8 hours for a week to see if this improve.  Take this with food to avoid stomach upset.  Plantar Fasciitis   Plantar fasciitis is a painful foot condition that affects the heel. It occurs when the band of tissue that connects the toes to the heel bone (plantar fascia) becomes irritated. This can happen after exercising too much or doing other repetitive activities (overuse injury). The pain from plantar fasciitis can range from mild irritation to severe pain that makes it difficult for you to walk or move. The pain is usually worse in the morning or after you have been sitting or lying down for a while. What are the causes? This condition may be caused by:  Standing for long periods of time.  Wearing shoes that do not fit.  Doing high-impact activities, including running, aerobics, and ballet.  Being overweight.  Having an abnormal way of walking (gait).  Having tight calf muscles.  Having high arches in your feet.  Starting a new athletic activity.  What are the signs or symptoms? The main symptom of this condition is heel pain. Other symptoms include:  Pain that gets worse after activity or exercise.  Pain that is worse in the morning or after resting.  Pain that goes away after you walk for a few minutes.  How is this diagnosed? This condition may be diagnosed based on your signs and symptoms. Your health care provider will also do a physical exam to check for:  A tender area on the bottom of your foot.  A high arch in your foot.  Pain when you move your foot.  Difficulty moving your foot.  You may also need to have imaging studies to confirm the diagnosis. These can  include:  X-rays.  Ultrasound.  MRI.  How is this treated? Treatment for plantar fasciitis depends on the severity of the condition. Your treatment may include:  Rest, ice, and over-the-counter pain medicines to manage your pain.  Exercises to stretch your calves and your plantar fascia.  A splint that holds your foot in a stretched, upward position while you sleep (night splint).  Physical therapy to relieve symptoms and prevent problems in the future.  Cortisone injections to relieve severe pain.  Extracorporeal shock wave therapy (ESWT) to stimulate damaged plantar fascia with electrical impulses. It is often used as a last resort before surgery.  Surgery, if other treatments have not worked after 12 months.  Follow these instructions at home:  Take medicines only as directed by your health care provider.  Avoid activities that cause pain.  Roll the bottom of your foot over a bag of ice or a bottle of cold water. Do this for 20 minutes, 3-4 times a day.  Perform simple stretches as directed by your health care provider.  Try wearing athletic shoes with air-sole or gel-sole cushions or soft shoe inserts.  Wear a night splint while sleeping, if directed by your health care provider.  Keep all follow-up appointments with your health care provider. How is this prevented?  Do not perform exercises or activities that cause heel pain.  Consider finding low-impact activities if you continue to have problems.  Lose weight if you need to. The best  way to prevent plantar fasciitis is to avoid the activities that aggravate your plantar fascia. Contact a health care provider if:  Your symptoms do not go away after treatment with home care measures.  Your pain gets worse.  Your pain affects your ability to move or do your daily activities. This information is not intended to replace advice given to you by your health care provider. Make sure you discuss any questions you  have with your health care provider. Document Released: 02/19/2001 Document Revised: 10/30/2015 Document Reviewed: 04/06/2014 Elsevier Interactive Patient Education  2018 [place] are due for the Shingrix vaccination.  You may get this an your local pharmacy.  Contact them for available to this.  There may be a wait list.  Be low is some information about this vaccination from [facility], [place].  Shingrix, which was approved by the FDA in October 2017. Doses are given two to six months apart. Shingrix is said to be more than 90% effective against shingles and postherpetic neuralgia -- a painful nerve condition that can arise as a shingles complication.  Zostavax has been used since 2006 and has been reported to reduce the risk of shingles by 51%. Research has also shown that Zostavax loses its ability to prevent shingles after five years. If you've ever had chickenpox, you are at risk for shingles, which is essentially a re-emergence of the virus that caused your chickenpox. The CDC recommends to get the Shingrix vaccination even if you aren't sure you've had chickenpox and if you've already had shingles. Although it's uncommon, you can get shingles more than once. In addition, you should get the Shingrix vaccine even if you already got the Zostavax vaccine.  The wait time between these vaccinations is eight weeks.  Who Should Get Shingrix? Healthy adults 50 years and older should get two doses of Shingrix, separated by 2 to 6 months. You should get Shingrix even if in the past you . had shingles  . received Zostavax  . are not sure if you had chickenpox There is no maximum age for getting Shingrix. If you had shingles in the past, you can get Shingrix to help prevent future occurrences of the disease. There is no specific length of time that you need to wait after having shingles before you can receive Shingrix, but generally you should make  sure the shingles rash has gone away before getting vaccinated. You can get Shingrix whether or not you remember having had chickenpox in the past. Studies show that more than 99% of Americans 40 years and older have had chickenpox, even if they don't remember having the disease. Chickenpox and shingles are related because they are caused by the same virus (varicella zoster virus). After a person recovers from chickenpox, the virus stays dormant (inactive) in the body. It can reactivate years later and cause shingles. If you had Zostavax in the recent past, you should wait at least eight weeks before getting Shingrix. Talk to your healthcare provider to determine the best time to get Shingrix. Shingrix is available in [facility] and pharmacies. To find doctor's offices or pharmacies near you that offer the vaccine, visit [employer] Vaccine FinderExternal. If you have questions about Shingrix, talk with your healthcare provider. Vaccine for Those 28 Years and Older  Shingrix reduces the risk of shingles and PHN by more than 90% in people 78 and older. CDC recommends the vaccine for healthy adults 50  and older.   Who Should Not Get Shingrix? You should not get Shingrix if you: . have ever had a severe allergic reaction to any component of the vaccine or after a dose of Shingrix  . tested negative for immunity to varicella zoster virus. If you test negative, you should get chickenpox vaccine.  . currently have shingles  . currently are pregnant or breastfeeding. Women who are pregnant or breastfeeding should wait to get Shingrix.  Marland Kitchen receive specific antiviral drugs (acyclovir, famciclovir, or valacyclovir) 24 hours before vaccination (avoid use of these antiviral drugs for 14 days after vaccination)- zoster vaccine live only If you have a minor acute (starts suddenly) illness, such as a cold, you may get Shingrix. But if you have a moderate or severe acute illness, you should usually wait until you  recover before getting the vaccine. This includes anyone with a temperature of 101.54F or higher. The side effects of the Shingrix are temporary, and usually last 2 to 3 days. While you may experience pain for a few days after getting Shingrix, the pain will be less severe than having shingles and the complications from the disease.  Risks of Shingrix vaccination:      A sore arm with mild or moderate pain is very common after recombinant shingles vaccine, affecting about 80% of vaccinated people. Redness and swelling can also happen at the site of the injection.     Tiredness, muscle pain, headache, shivering, fever, stomach pain, and nausea happen after vaccination in more than half of people who receive recombinant shingles vaccine.  In clinical trials, about 1 out of 6 people who got recombinant zoster vaccine experienced side effects that prevented them from doing regular activities. Symptoms usually went away on their own in 2 to 3 days.  You should still get the second dose of recombinant zoster vaccine even if you had one of these reactions after the first dose.  People sometimes faint after medical procedures, including vaccination. Tell your provider if you feel dizzy or have vision changes or ringing in the ears.  As with any medicine, there is a very remote chance of a vaccine causing a severe allergic reaction, other serious injury, or death.

## 2020-08-09 NOTE — Progress Notes (Addendum)
Complete Physical  She would prefer to not go on a medication, will prefer close follow up and diet/life style modification. Patient is very motivated and has a good outlook.   Assessment and Plan:  Encounter for general adult medical examination with abnormal findings 1 year  Morbid obesity ([place]) - follow up 3 months for progress monitoring - increase veggies, decrease carbs - long discussion about weight loss, diet, and exercise  Thyroid disease Taking levothyroxine 150 mcg half tblet Tues & Thurs, whole tablet five days a week. Reminder to take on an empty stomach 30-55mins before first meal of the day. No antacid medications for 4 hours. -     TSH    Other abnormal glucose Discussed dietary and exercise modifications -     Hemoglobin A1c  Essential hypertension Continue current medications: Losartan 100mg , HCTZ 25mg , Bisoprolol 5-6.26 daily. Monitor blood pressure at home; call if consistently over 130/80 Continue DASH diet.   Reminder to go to the ER if any CP, SOB, nausea, dizziness, severe HA, changes vision/speech, left arm numbness and tingling and jaw pain. -     CBC with Differential/Platelet -     COMPLETE METABOLIC PANEL WITH GFR -     TSH -     EKG 12-Lead - continue medications, DASH diet, exercise and monitor at home. Call if greater than 130/80.   Hyperlipidemia, unspecified hyperlipidemia type -     Lipid panel -     EKG 12-Lead check lipids decrease fatty foods increase activity.   Depression, major, recurrent, in partial remission (HCC) Continue medications and exercise  Medication management -     Magnesium  Vitamin D deficiency -Continue supplementation to maintain goal of 70-100 Taking Vitamin D 5,000 IU daily -Vitamin D  Vertigo -     diazepam (VALIUM) 5 MG tablet; 1/2-1 pill every 8 hours for dizziness/vertigo - has long history, uses valium PRN, normal neuro Can do vestibular therapy  Screening, anemia, deficiency, iron -      Iron,Total/Total Iron Binding Cap -     Vitamin B12  Bilateral foot pain Ice bilateral feet BID Take ibuprofen 600mg  every 6 hours or 800mg  every 8 for two weeks, take with food Try supports in shoes Decreased sensation bilateral great toes    Further disposition pending results if labs check today. Discussed med's effects and SE's.   Over 30 minutes of face to face interview, exam, counseling, chart review, and critical decision making was performed.   Future Appointments  Date Time Provider [place]  09/07/2020  8:00 AM [facility] PREVISIT RM50 [facility]  09/21/2020  9:00 AM Jackquline Denmark, MD [facility]  02/21/2021  9:30 AM Liane Comber, NP [facility] None  08/09/2021  9:00 AM Garnet Sierras, NP [facility] None     HPI 60 y.o. female  presents for a complete physical.  Reports she had right lower abdominal pain that is intermittent, it is chronic. Has had transvaginal ultrasound, unremarkable.    She has history of vertigo, will use valium PRN for this. Vertigo only with turning over in bed.  She has tinnitus but no hearing loss.   She is having painful intercourse with initial penetration, feels like something is in the way, nothing feels different, no heaviness, [name] august 2019.  LBM on day ago typically 3-4 on [place] scale.   Her blood pressure has been controlled at home, she is on HCTZ and states she has had fatigue, wondering if her potassium is low, she took some  over the summer with fatigue and felt better, today their BP is BP: 136/70 She does workout. She denies chest pain, shortness of breath, dizziness.   She had RTK with Dr. Deanne Coffer 2019 in [place]. Has some left knee pain, but has been increasing her walking and walks her dogs.  She reports some decreased sensation in bilateral feet. She reports that after she gets moving it does improve.  She has not done any treatments for this.  Seems to have decrease sensation to bilateral  great toes.   She is not on cholesterol medication and denies myalgias. Her cholesterol is at goal. The cholesterol last visit was:   Lab Results  Component Value Date   CHOL 195 08/09/2020   HDL 57 08/09/2020   LDLCALC 116 (H) 08/09/2020   TRIG 111 08/09/2020   CHOLHDL 3.4 08/09/2020   Last A1C in the office was:  Lab Results  Component Value Date   HGBA1C 5.7 (H) 08/09/2020   Patient is on Vitamin D supplement, 10,000.   Lab Results  Component Value Date   VD25OH 95 08/09/2020   She could not tolerate contrave due to constipation. She can not tolerate phentermine.  She is on wellbutrin everyday to help with weight.   Wt Readings from Last 3 Encounters:  08/09/20 268 lb (121.6 kg)  01/19/20 264 lb (119.7 kg)  07/22/19 270 lb (122.5 kg)   She is on thyroid medication. Her medication was not changed last visit. She is on 1/2 tablet Tues, Thursday and other days 1 tablet, no biotin supplement. Patient denies heat / cold intolerance, nervousness and palpitations.  Lab Results  Component Value Date   TSH 0.53 08/09/2020  .  Current Medications:  Current Outpatient Medications on File Prior to Visit  Medication Sig Dispense Refill  . B Complex Vitamins (B COMPLEX PO) Take by mouth.    . bisoprolol-hydrochlorothiazide (ZIAC) 5-6.25 MG tablet Take 1-2 tabs by mouth daily for BP goal <130/80. 30 tablet 1  . buPROPion (WELLBUTRIN XL) 300 MG 24 hr tablet Take 1 tablet every Morning for Mood 90 tablet 3  . Cholecalciferol (VITAMIN D PO) Take 5,000 Int'l Units by mouth daily.    . diazepam (VALIUM) 5 MG tablet 1/2-1 pill every 8 hours for dizziness/vertigo 30 tablet 0  . hydrochlorothiazide (HYDRODIURIL) 25 MG tablet Take 1 tablet by mouth daily for blood pressure and fluid retention/ankle swelling. 90 tablet 0  . levothyroxine (SYNTHROID) 150 MCG tablet Take 1/2 tablet  on Tuesday and Thursday  &  take 1 tablet  on all other days - Take  on an empty stomach with only water for 30  minutes & no Antacid meds, Calcium or Magnesium for 4 hours & avoid Biotin 90 tablet 0  . MAGNESIUM PO Take by mouth.    . Omega-3 Fatty Acids (FISH OIL PO) Take 2,000 mg by mouth daily.    Marland Kitchen OVER THE COUNTER MEDICATION 400 mg daily. Bio Cocumin    . OVER THE COUNTER MEDICATION     . triamcinolone cream (KENALOG) 0.5 % Apply 1 application topically 2 (two) times daily. 80 g 2   No current facility-administered medications on file prior to visit.   Health Maintenance:   Immunization History  Administered Date(s) Administered  . Influenza Split 05/10/2014  . PFIZER(Purple Top)SARS-COV-2 Vaccination 09/20/2019, 10/11/2019  . Td 06/11/2007  . Tdap 06/18/2018   Tetanus: 2020 Pneumovax:N/A Prevnar 13:N/A Flu vaccine: 2020 shingrix discussed  Pap: 06/2018 neg HPV 5 years  August of 2019 was last menses, sept 2020 had spotting and has had 1 episode of right lower AB pain, states felt like ovarian cyst?  No [name] recorded. (Menstrual status: Perimenopausal). MGM: 09/06/2019 WNL DEXA: n/A Colonoscopy: [facility] in [place]- 2015 due in 5 years- Scheduled 09/2020 EGD:N/A  Medical History:  Past Medical History:  Diagnosis Date  . Thyroid disease    Allergies Allergies  Allergen Reactions  . Augmentin [Amoxicillin-Pot Clavulanate] Nausea And Vomiting   SURGICAL HISTORY She  has a past surgical history that includes Knee arthroscopy (Right, 2015) and Replacement total knee (Right, 04/2018). FAMILY HISTORY Her family history includes Alzheimer's disease (age of onset: 60) in her maternal grandfather; Alzheimer's disease (age of onset: 26) in her mother; Arthritis in her father; Cancer in her father; Depression in her brother; Gout in her father; Hypertension in her father and mother; Mental illness in her daughter. SOCIAL HISTORY She  reports that she has never smoked. She has never used smokeless tobacco. She reports that she does not drink alcohol and does not use drugs.  Review of  Systems  Constitutional: Negative.   HENT: Negative.   Eyes: Negative.   Respiratory: Negative.   Cardiovascular: Negative.   Gastrointestinal: Negative for constipation.  Genitourinary: Negative.   Musculoskeletal: Positive for myalgias (Bilateral foot pain). Negative for back pain, falls, joint pain (right knee RECENT SURGERY) and neck pain.  Skin: Negative.   Neurological: Negative.   Psychiatric/Behavioral: Negative.     Physical Exam: Estimated body mass index is 40.75 kg/m as calculated from the following:   Height as of this encounter: 5\' 8"  (1.727 m).   Weight as of this encounter: 268 lb (121.6 kg). BP 136/70   Pulse 76   Temp 97.7 F (36.5 C)   Ht 5\' 8"  (1.727 m)   Wt 268 lb (121.6 kg)   SpO2 97%   BMI 40.75 kg/m  General Appearance: Well nourished, in no apparent distress. Eyes: PERRLA, EOMs, conjunctiva no swelling or erythema, normal fundi and vessels. Sinuses: No Frontal/maxillary tenderness ENT/Mouth: Ext aud canals clear, normal light reflex with TMs without erythema, bulging.  Good dentition. No erythema, swelling, or exudate on post pharynx. Tonsils not swollen or erythematous. Hearing normal.  Neck: Supple, thyroid normal. No bruits Respiratory: Respiratory effort normal, BS equal bilaterally without rales, rhonchi, wheezing or stridor. Cardio: RRR without murmurs, rubs or gallops. Brisk peripheral pulses without edema.  Chest: symmetric, with normal excursions and percussion. Abdomen: Soft, +BS. Non tender, no guarding, rebound, hernias, masses, or organomegaly. .  Lymphatics: Non tender without lymphadenopathy.  Musculoskeletal: Full ROM all peripheral extremities,5/5 strength, and normal gait. Skin: Warm, dry without rashes, lesions, ecchymosis. Actinic keratosis noted to left chest. Neuro: Cranial nerves intact, reflexes equal bilaterally. Normal muscle tone, no cerebellar symptoms. Sensation intact.  Psych: Awake and oriented X 3, normal affect,  Insight and Judgment appropriate.    EKG: WNL    Bayard Males, DNP [facility] 08/29/2020  9:24 PM

## 2020-08-10 DIAGNOSIS — M9901 Segmental and somatic dysfunction of cervical region: Secondary | ICD-10-CM | POA: Diagnosis not present

## 2020-08-10 DIAGNOSIS — M5032 Other cervical disc degeneration, mid-cervical region, unspecified level: Secondary | ICD-10-CM | POA: Diagnosis not present

## 2020-08-10 DIAGNOSIS — M9902 Segmental and somatic dysfunction of thoracic region: Secondary | ICD-10-CM | POA: Diagnosis not present

## 2020-08-10 DIAGNOSIS — M530 Cervicocranial syndrome: Secondary | ICD-10-CM | POA: Diagnosis not present

## 2020-08-10 LAB — IRON, TOTAL/TOTAL IRON BINDING CAP
%SAT: 42 % (calc) (ref 16–45)
Iron: 125 ug/dL (ref 45–160)
TIBC: 298 mcg/dL (calc) (ref 250–450)

## 2020-08-10 LAB — COMPLETE METABOLIC PANEL WITH GFR
AG Ratio: 1.9 (calc) (ref 1.0–2.5)
ALT: 38 U/L — ABNORMAL HIGH (ref 6–29)
AST: 24 U/L (ref 10–35)
Albumin: 4.3 g/dL (ref 3.6–5.1)
Alkaline phosphatase (APISO): 111 U/L (ref 37–153)
BUN: 10 mg/dL (ref 7–25)
CO2: 28 mmol/L (ref 20–32)
Calcium: 9.8 mg/dL (ref 8.6–10.4)
Chloride: 104 mmol/L (ref 98–110)
Creat: 0.78 mg/dL (ref 0.50–1.05)
GFR, Est African American: 97 mL/min/{1.73_m2} (ref >=60)
GFR, Est Non African American: 84 mL/min/{1.73_m2} (ref >=60)
Globulin: 2.3 g/dL (calc) (ref 1.9–3.7)
Glucose, Bld: 98 mg/dL (ref 65–99)
Potassium: 4.1 mmol/L (ref 3.5–5.3)
Sodium: 141 mmol/L (ref 135–146)
Total Bilirubin: 0.6 mg/dL (ref 0.2–1.2)
Total Protein: 6.6 g/dL (ref 6.1–8.1)

## 2020-08-10 LAB — LIPID PANEL
Cholesterol: 195 mg/dL (ref <200)
HDL: 57 mg/dL (ref >=50)
LDL Cholesterol (Calc): 116 mg/dL (calc) — ABNORMAL HIGH
Non-HDL Cholesterol (Calc): 138 mg/dL (calc) — ABNORMAL HIGH (ref <130)
Total CHOL/HDL Ratio: 3.4 (calc) (ref <5.0)
Triglycerides: 111 mg/dL (ref <150)

## 2020-08-10 LAB — URINALYSIS W MICROSCOPIC + REFLEX CULTURE
Bacteria, UA: NONE SEEN /HPF
Bilirubin Urine: NEGATIVE
Glucose, UA: NEGATIVE
Hyaline Cast: NONE SEEN /LPF
Ketones, ur: NEGATIVE
Leukocyte Esterase: NEGATIVE
Nitrites, Initial: NEGATIVE
Protein, ur: NEGATIVE
Specific Gravity, Urine: 1.005 (ref 1.001–1.03)
WBC, UA: NONE SEEN /HPF (ref 0–5)
pH: 8 (ref 5.0–8.0)

## 2020-08-10 LAB — HEMOGLOBIN A1C
Hgb A1c MFr Bld: 5.7 % of total Hgb — ABNORMAL HIGH (ref <5.7)
Mean Plasma Glucose: 117 mg/dL
eAG (mmol/L): 6.5 mmol/L

## 2020-08-10 LAB — CBC WITH DIFFERENTIAL/PLATELET
Absolute Monocytes: 488 cells/uL (ref 200–950)
Basophils Absolute: 40 cells/uL (ref 0–200)
Basophils Relative: 0.6 %
Eosinophils Absolute: 158 cells/uL (ref 15–500)
Eosinophils Relative: 2.4 %
HCT: 46.8 % — ABNORMAL HIGH (ref 35.0–45.0)
Hemoglobin: 16.1 g/dL — ABNORMAL HIGH (ref 11.7–15.5)
Lymphs Abs: 1426 cells/uL (ref 850–3900)
MCH: 31.1 pg (ref 27.0–33.0)
MCHC: 34.4 g/dL (ref 32.0–36.0)
MCV: 90.5 fL (ref 80.0–100.0)
MPV: 10.7 fL (ref 7.5–12.5)
Monocytes Relative: 7.4 %
Neutro Abs: 4488 cells/uL (ref 1500–7800)
Neutrophils Relative %: 68 %
Platelets: 264 10*3/uL (ref 140–400)
RBC: 5.17 10*6/uL — ABNORMAL HIGH (ref 3.80–5.10)
RDW: 13.4 % (ref 11.0–15.0)
Total Lymphocyte: 21.6 %
WBC: 6.6 10*3/uL (ref 3.8–10.8)

## 2020-08-10 LAB — TSH: TSH: 0.53 mIU/L (ref 0.40–4.50)

## 2020-08-10 LAB — VITAMIN B12: Vitamin B-12: 406 pg/mL (ref 200–1100)

## 2020-08-10 LAB — NO CULTURE INDICATED

## 2020-08-10 LAB — MAGNESIUM: Magnesium: 2.2 mg/dL (ref 1.5–2.5)

## 2020-08-10 LAB — VITAMIN D 25 HYDROXY (VIT D DEFICIENCY, FRACTURES): Vit D, 25-Hydroxy: 95 ng/mL (ref 30–100)

## 2020-08-11 DIAGNOSIS — M5032 Other cervical disc degeneration, mid-cervical region, unspecified level: Secondary | ICD-10-CM | POA: Diagnosis not present

## 2020-08-11 DIAGNOSIS — M9901 Segmental and somatic dysfunction of cervical region: Secondary | ICD-10-CM | POA: Diagnosis not present

## 2020-08-11 DIAGNOSIS — M9902 Segmental and somatic dysfunction of thoracic region: Secondary | ICD-10-CM | POA: Diagnosis not present

## 2020-08-11 DIAGNOSIS — M530 Cervicocranial syndrome: Secondary | ICD-10-CM | POA: Diagnosis not present

## 2020-08-14 DIAGNOSIS — M530 Cervicocranial syndrome: Secondary | ICD-10-CM | POA: Diagnosis not present

## 2020-08-14 DIAGNOSIS — M9901 Segmental and somatic dysfunction of cervical region: Secondary | ICD-10-CM | POA: Diagnosis not present

## 2020-08-14 DIAGNOSIS — M9902 Segmental and somatic dysfunction of thoracic region: Secondary | ICD-10-CM | POA: Diagnosis not present

## 2020-08-14 DIAGNOSIS — M5032 Other cervical disc degeneration, mid-cervical region, unspecified level: Secondary | ICD-10-CM | POA: Diagnosis not present

## 2020-08-14 LAB — SARS-COV-2 SEMI-QUANTITATIVE TOTAL ANTIBODY, SPIKE: SARS COV2 AB, Total Spike Semi QN: 111 U/mL — ABNORMAL HIGH (ref <0.8)

## 2020-08-16 DIAGNOSIS — M530 Cervicocranial syndrome: Secondary | ICD-10-CM | POA: Diagnosis not present

## 2020-08-16 DIAGNOSIS — M9902 Segmental and somatic dysfunction of thoracic region: Secondary | ICD-10-CM | POA: Diagnosis not present

## 2020-08-16 DIAGNOSIS — M5032 Other cervical disc degeneration, mid-cervical region, unspecified level: Secondary | ICD-10-CM | POA: Diagnosis not present

## 2020-08-16 DIAGNOSIS — M9901 Segmental and somatic dysfunction of cervical region: Secondary | ICD-10-CM | POA: Diagnosis not present

## 2020-08-18 DIAGNOSIS — M9902 Segmental and somatic dysfunction of thoracic region: Secondary | ICD-10-CM | POA: Diagnosis not present

## 2020-08-18 DIAGNOSIS — M9901 Segmental and somatic dysfunction of cervical region: Secondary | ICD-10-CM | POA: Diagnosis not present

## 2020-08-18 DIAGNOSIS — M530 Cervicocranial syndrome: Secondary | ICD-10-CM | POA: Diagnosis not present

## 2020-08-18 DIAGNOSIS — M5032 Other cervical disc degeneration, mid-cervical region, unspecified level: Secondary | ICD-10-CM | POA: Diagnosis not present

## 2020-08-22 DIAGNOSIS — M530 Cervicocranial syndrome: Secondary | ICD-10-CM | POA: Diagnosis not present

## 2020-08-22 DIAGNOSIS — M5032 Other cervical disc degeneration, mid-cervical region, unspecified level: Secondary | ICD-10-CM | POA: Diagnosis not present

## 2020-08-22 DIAGNOSIS — M9902 Segmental and somatic dysfunction of thoracic region: Secondary | ICD-10-CM | POA: Diagnosis not present

## 2020-08-22 DIAGNOSIS — M9901 Segmental and somatic dysfunction of cervical region: Secondary | ICD-10-CM | POA: Diagnosis not present

## 2020-08-24 DIAGNOSIS — M9901 Segmental and somatic dysfunction of cervical region: Secondary | ICD-10-CM | POA: Diagnosis not present

## 2020-08-24 DIAGNOSIS — M9902 Segmental and somatic dysfunction of thoracic region: Secondary | ICD-10-CM | POA: Diagnosis not present

## 2020-08-24 DIAGNOSIS — M530 Cervicocranial syndrome: Secondary | ICD-10-CM | POA: Diagnosis not present

## 2020-08-24 DIAGNOSIS — M5032 Other cervical disc degeneration, mid-cervical region, unspecified level: Secondary | ICD-10-CM | POA: Diagnosis not present

## 2020-08-29 ENCOUNTER — Encounter: Payer: Self-pay | Admitting: Adult Health Nurse Practitioner

## 2020-08-29 ENCOUNTER — Other Ambulatory Visit: Payer: Self-pay | Admitting: Adult Health Nurse Practitioner

## 2020-08-29 DIAGNOSIS — I10 Essential (primary) hypertension: Secondary | ICD-10-CM

## 2020-08-29 MED ORDER — OLMESARTAN MEDOXOMIL 20 MG PO TABS: 20.0000 mg | ORAL_TABLET | Freq: Every day | ORAL | 3 refills | Status: DC

## 2020-08-29 NOTE — Progress Notes (Signed)
Order placed for Olmsesartan 20mg  daily to replace Losartan 100mg  daily related to shortage.  Garnet Sierras, Laqueta Jean, DNP [facility] 08/29/2020  10:26 AM

## 2020-08-30 DIAGNOSIS — M530 Cervicocranial syndrome: Secondary | ICD-10-CM | POA: Diagnosis not present

## 2020-08-30 DIAGNOSIS — M9902 Segmental and somatic dysfunction of thoracic region: Secondary | ICD-10-CM | POA: Diagnosis not present

## 2020-08-30 DIAGNOSIS — M5032 Other cervical disc degeneration, mid-cervical region, unspecified level: Secondary | ICD-10-CM | POA: Diagnosis not present

## 2020-08-30 DIAGNOSIS — M9901 Segmental and somatic dysfunction of cervical region: Secondary | ICD-10-CM | POA: Diagnosis not present

## 2020-09-01 DIAGNOSIS — M9901 Segmental and somatic dysfunction of cervical region: Secondary | ICD-10-CM | POA: Diagnosis not present

## 2020-09-01 DIAGNOSIS — M5032 Other cervical disc degeneration, mid-cervical region, unspecified level: Secondary | ICD-10-CM | POA: Diagnosis not present

## 2020-09-01 DIAGNOSIS — M9902 Segmental and somatic dysfunction of thoracic region: Secondary | ICD-10-CM | POA: Diagnosis not present

## 2020-09-01 DIAGNOSIS — M530 Cervicocranial syndrome: Secondary | ICD-10-CM | POA: Diagnosis not present

## 2020-09-06 DIAGNOSIS — L821 Other seborrheic keratosis: Secondary | ICD-10-CM | POA: Diagnosis not present

## 2020-09-06 DIAGNOSIS — L814 Other melanin hyperpigmentation: Secondary | ICD-10-CM | POA: Diagnosis not present

## 2020-09-06 DIAGNOSIS — L578 Other skin changes due to chronic exposure to nonionizing radiation: Secondary | ICD-10-CM | POA: Diagnosis not present

## 2020-09-07 ENCOUNTER — Other Ambulatory Visit: Payer: Self-pay | Admitting: Internal Medicine

## 2020-09-07 ENCOUNTER — Other Ambulatory Visit: Payer: Self-pay

## 2020-09-07 ENCOUNTER — Ambulatory Visit (AMBULATORY_SURGERY_CENTER): Payer: Self-pay | Admitting: *Deleted

## 2020-09-07 VITALS — Ht 68.0 in | Wt 274.0 lb

## 2020-09-07 DIAGNOSIS — M5032 Other cervical disc degeneration, mid-cervical region, unspecified level: Secondary | ICD-10-CM | POA: Diagnosis not present

## 2020-09-07 DIAGNOSIS — M9901 Segmental and somatic dysfunction of cervical region: Secondary | ICD-10-CM | POA: Diagnosis not present

## 2020-09-07 DIAGNOSIS — Z800 Family history of malignant neoplasm of digestive organs: Secondary | ICD-10-CM

## 2020-09-07 DIAGNOSIS — M9902 Segmental and somatic dysfunction of thoracic region: Secondary | ICD-10-CM | POA: Diagnosis not present

## 2020-09-07 DIAGNOSIS — M530 Cervicocranial syndrome: Secondary | ICD-10-CM | POA: Diagnosis not present

## 2020-09-07 DIAGNOSIS — Z1231 Encounter for screening mammogram for malignant neoplasm of breast: Secondary | ICD-10-CM

## 2020-09-07 NOTE — Progress Notes (Signed)

## 2020-09-19 ENCOUNTER — Encounter: Payer: Self-pay | Admitting: Gastroenterology

## 2020-09-20 DIAGNOSIS — M530 Cervicocranial syndrome: Secondary | ICD-10-CM | POA: Diagnosis not present

## 2020-09-20 DIAGNOSIS — M9901 Segmental and somatic dysfunction of cervical region: Secondary | ICD-10-CM | POA: Diagnosis not present

## 2020-09-20 DIAGNOSIS — M9902 Segmental and somatic dysfunction of thoracic region: Secondary | ICD-10-CM | POA: Diagnosis not present

## 2020-09-20 DIAGNOSIS — M5032 Other cervical disc degeneration, mid-cervical region, unspecified level: Secondary | ICD-10-CM | POA: Diagnosis not present

## 2020-09-21 ENCOUNTER — Other Ambulatory Visit: Payer: Self-pay

## 2020-09-21 ENCOUNTER — Encounter: Payer: Self-pay | Admitting: Gastroenterology

## 2020-09-21 ENCOUNTER — Other Ambulatory Visit: Payer: Self-pay | Admitting: Gastroenterology

## 2020-09-21 ENCOUNTER — Ambulatory Visit (AMBULATORY_SURGERY_CENTER): Payer: BC Managed Care – PPO | Admitting: Gastroenterology

## 2020-09-21 VITALS — BP 118/71 | HR 62 | Temp 97.8°F | Resp 11 | Ht 68.0 in | Wt 274.0 lb

## 2020-09-21 DIAGNOSIS — K635 Polyp of colon: Secondary | ICD-10-CM

## 2020-09-21 DIAGNOSIS — D122 Benign neoplasm of ascending colon: Secondary | ICD-10-CM

## 2020-09-21 DIAGNOSIS — Z1211 Encounter for screening for malignant neoplasm of colon: Secondary | ICD-10-CM | POA: Diagnosis not present

## 2020-09-21 DIAGNOSIS — Z800 Family history of malignant neoplasm of digestive organs: Secondary | ICD-10-CM | POA: Diagnosis not present

## 2020-09-21 MED ORDER — SODIUM CHLORIDE 0.9 % IV SOLN
4.0000 mg | Freq: Once | INTRAVENOUS | Status: AC
  Administered 2020-09-21: 4 mg via INTRAVENOUS

## 2020-09-21 MED ORDER — SODIUM CHLORIDE 0.9 % IV SOLN: 500.0000 mL | Freq: Once | INTRAVENOUS | Status: DC

## 2020-09-21 MED ORDER — FLEET ENEMA 7-19 GM/118ML RE ENEM
1.0000 | ENEMA | Freq: Once | RECTAL | Status: AC
  Administered 2020-09-21: 1 via RECTAL

## 2020-09-21 NOTE — Progress Notes (Signed)
VS-CW  Pt's states no medical or surgical changes since previsit or office visit.  Pt reports vomiting with both PM and AM doses of Miralax. Reports vomiting was a moderate amount and there was a small amount of bright red blood mixed in when she vomited this AM. Pt still with some nausea this morning. Pt reports tan liquid stool. MD made aware. Orders received to give 4mg  IV Zofran and a fleets enema. Medications given. Results are darker yellow color, all liquid, can see to the bottom of the toilet. MD made aware. Will proceed with Colonoscopy today.

## 2020-09-21 NOTE — Progress Notes (Signed)
Called to room to assist during endoscopic procedure.  Patient ID and intended procedure confirmed with present staff. Received instructions for my participation in the procedure from the performing physician.  

## 2020-09-21 NOTE — Op Note (Signed)
[place] Patient Name: Jenny Arroyo Procedure Date: 09/21/2020 9:12 AM MRN: [id number] Endoscopist: Jackquline Denmark , MD Age: 60 Referring MD:  Date of Birth: 1960-06-25 Gender: Female Account #: [id number] Procedure:                Colonoscopy Indications:              Screening in patient at increased risk: Colorectal                            cancer in father at age 59 Medicines:                Monitored Anesthesia Care Procedure:                Pre-Anesthesia Assessment:                           - Prior to the procedure, a History and Physical                            was performed, and patient medications and                            allergies were reviewed. The patient's tolerance of                            previous anesthesia was also reviewed. The risks                            and benefits of the procedure and the sedation                            options and risks were discussed with the patient.                            All questions were answered, and informed consent                            was obtained. Prior Anticoagulants: The patient has                            taken no previous anticoagulant or antiplatelet                            agents. ASA Grade Assessment: II - A patient with                            mild systemic disease. After reviewing the risks                            and benefits, the patient was deemed in                            satisfactory condition to undergo the procedure.  After obtaining informed consent, the colonoscope                            was passed under direct vision. Throughout the                            procedure, the patient's blood pressure, pulse, and                            oxygen saturations were monitored continuously. The                            Olympus CF-HQ190 (#[id number]) Colonoscope was                            introduced through the anus and  advanced to the 2                            cm into the ileum. The colonoscopy was performed                            without difficulty. The patient tolerated the                            procedure well. The quality of the bowel                            preparation was adequate to identify polyps. Some                            retained stool especially in the right side of the                            colon. Aggressive suctioning aspiration was                            performed. Overall 90-95% of the colonic mucosa was                            visualized satisfactorily. Examination was                            adequate. The terminal ileum, ileocecal valve,                            appendiceal orifice, and rectum were photographed. Scope In: 9:19:14 AM Scope Out: 9:37:47 AM Scope Withdrawal Time: 0 hours 9 minutes 47 seconds  Total Procedure Duration: 0 hours 18 minutes 33 seconds  Findings:                 A 6 mm polyp was found in the proximal ascending                            colon. The polyp was sessile. The  polyp was removed                            with a cold snare. Resection and retrieval were                            complete.                           A few small-mouthed diverticula were found in the                            sigmoid colon.                           Non-bleeding internal hemorrhoids were found during                            retroflexion. The hemorrhoids were small.                           The terminal ileum appeared normal.                           The exam was otherwise without abnormality on                            direct and retroflexion views. Complications:            No immediate complications. Estimated Blood Loss:     Estimated blood loss: none. Impression:               - One 6 mm polyp in the proximal ascending colon,                            removed with a cold snare. Resected and retrieved.                            - Minimal sigmoid diverticulosis                           - The examined portion of the ileum was normal.                           - The examination was otherwise normal on direct                            and retroflexion views. Recommendation:           - Patient has a contact number available for                            emergencies. The signs and symptoms of potential                            delayed complications were discussed with the  patient. Return to normal activities tomorrow.                            Written discharge instructions were provided to the                            patient.                           - Resume previous diet.                           - Continue present medications.                           - Await pathology results.                           - Repeat colonoscopy in 5 years for surveillance                            based on pathology results. Certainly earlier if                            with any new problems or change in family history.                           - Return to GI clinic PRN.                           - The findings and recommendations were discussed                            with the patient's husband. Jackquline Denmark, MD 09/21/2020 9:43:02 AM This report has been signed electronically.

## 2020-09-21 NOTE — Progress Notes (Signed)
To PACU, VSS. Report to [email or web address] 

## 2020-09-21 NOTE — Patient Instructions (Signed)
.  Handout on polyps, diverticulosis given.    YOU HAD AN ENDOSCOPIC PROCEDURE TODAY AT THE [facility]:   Refer to the procedure report that was given to you for any specific questions about what was found during the examination.  If the procedure report does not answer your questions, please call your gastroenterologist to clarify.  If you requested that your care partner not be given the details of your procedure findings, then the procedure report has been included in a sealed envelope for you to review at your convenience later.  YOU SHOULD EXPECT: Some feelings of bloating in the abdomen. Passage of more gas than usual.  Walking can help get rid of the air that was put into your GI tract during the procedure and reduce the bloating. If you had a lower endoscopy (such as a colonoscopy or flexible sigmoidoscopy) you may notice spotting of blood in your stool or on the toilet paper. If you underwent a bowel prep for your procedure, you may not have a normal bowel movement for a few days.  Please Note:  You might notice some irritation and congestion in your nose or some drainage.  This is from the oxygen used during your procedure.  There is no need for concern and it should clear up in a day or so.  SYMPTOMS TO REPORT IMMEDIATELY:   Following lower endoscopy (colonoscopy or flexible sigmoidoscopy):  Excessive amounts of blood in the stool  Significant tenderness or worsening of abdominal pains  Swelling of the abdomen that is new, acute  Fever of 100F or higher   For urgent or emergent issues, a gastroenterologist can be reached at any hour by calling [phone]. Do not use MyChart messaging for urgent concerns.    DIET:  We do recommend a small meal at first, but then you may proceed to your regular diet.  Drink plenty of fluids but you should avoid alcoholic beverages for 24 hours.  ACTIVITY:  You should plan to take it easy for the rest of today and you should NOT  DRIVE or use heavy machinery until tomorrow (because of the sedation medicines used during the test).    FOLLOW UP: Our staff will call the number listed on your records 48-72 hours following your procedure to check on you and address any questions or concerns that you may have regarding the information given to you following your procedure. If we do not reach you, we will leave a message.  We will attempt to reach you two times.  During this call, we will ask if you have developed any symptoms of COVID 19. If you develop any symptoms (ie: fever, flu-like symptoms, shortness of breath, cough etc.) before then, please call [phone].  If you test positive for Covid 19 in the 2 weeks post procedure, please call and report this information to us.    If any biopsies were taken you will be contacted by phone or by letter within the next 1-3 weeks.  Please call us at [phone] if you have not heard about the biopsies in 3 weeks.    SIGNATURES/CONFIDENTIALITY: You and/or your care partner have signed paperwork which will be entered into your electronic medical record.  These signatures attest to the fact that that the information above on your After Visit Summary has been reviewed and is understood.  Full responsibility of the confidentiality of this discharge information lies with you and/or your care-partner. 

## 2020-09-26 ENCOUNTER — Telehealth: Payer: Self-pay

## 2020-09-26 NOTE — Telephone Encounter (Signed)
  Follow up Call-  Call back number 09/21/2020  Post procedure Call Back phone  # [phone]  Permission to leave phone message Yes  Some recent data might be hidden     Patient questions:  Do you have a fever, pain , or abdominal swelling? No. Pain Score  0 *  Have you tolerated food without any problems? Yes.    Have you been able to return to your normal activities? Yes.    Do you have any questions about your discharge instructions: Diet   No. Medications  No. Follow up visit  No.  Do you have questions or concerns about your Care? No.  Actions: * If pain score is 4 or above: No action needed, pain <4.   1. Have you developed a fever since your procedure? no  2.   Have you had an respiratory symptoms (SOB or cough) since your procedure? no  3.   Have you tested positive for COVID 19 since your procedure? No   4.   Have you had any family members/close contacts diagnosed with the COVID 19 since your procedure?  No    If yes to any of these questions please route to Joylene John, RN and Joella Prince, RN

## 2020-10-05 ENCOUNTER — Encounter: Payer: Self-pay | Admitting: Gastroenterology

## 2020-10-09 ENCOUNTER — Emergency Department (HOSPITAL_BASED_OUTPATIENT_CLINIC_OR_DEPARTMENT_OTHER)
Admission: EM | Admit: 2020-10-09 | Discharge: 2020-10-09 | Disposition: A | Discharge status: Home / Self Care | Payer: BC Managed Care – PPO | Attending: Emergency Medicine | Admitting: Emergency Medicine

## 2020-10-09 ENCOUNTER — Encounter (HOSPITAL_BASED_OUTPATIENT_CLINIC_OR_DEPARTMENT_OTHER): Payer: Self-pay | Admitting: Emergency Medicine

## 2020-10-09 ENCOUNTER — Emergency Department (HOSPITAL_BASED_OUTPATIENT_CLINIC_OR_DEPARTMENT_OTHER): Payer: BC Managed Care – PPO | Admitting: Radiology

## 2020-10-09 ENCOUNTER — Other Ambulatory Visit: Payer: Self-pay

## 2020-10-09 DIAGNOSIS — S46912A Strain of unspecified muscle, fascia and tendon at shoulder and upper arm level, left arm, initial encounter: Secondary | ICD-10-CM | POA: Diagnosis not present

## 2020-10-09 DIAGNOSIS — R079 Chest pain, unspecified: Secondary | ICD-10-CM | POA: Diagnosis not present

## 2020-10-09 DIAGNOSIS — Y92410 Unspecified street and highway as the place of occurrence of the external cause: Secondary | ICD-10-CM | POA: Diagnosis not present

## 2020-10-09 DIAGNOSIS — S29011A Strain of muscle and tendon of front wall of thorax, initial encounter: Secondary | ICD-10-CM | POA: Insufficient documentation

## 2020-10-09 DIAGNOSIS — S76012A Strain of muscle, fascia and tendon of left hip, initial encounter: Secondary | ICD-10-CM | POA: Insufficient documentation

## 2020-10-09 DIAGNOSIS — Z7982 Long term (current) use of aspirin: Secondary | ICD-10-CM | POA: Diagnosis not present

## 2020-10-09 DIAGNOSIS — N189 Chronic kidney disease, unspecified: Secondary | ICD-10-CM | POA: Diagnosis not present

## 2020-10-09 DIAGNOSIS — I129 Hypertensive chronic kidney disease with stage 1 through stage 4 chronic kidney disease, or unspecified chronic kidney disease: Secondary | ICD-10-CM | POA: Insufficient documentation

## 2020-10-09 DIAGNOSIS — Z96651 Presence of right artificial knee joint: Secondary | ICD-10-CM | POA: Diagnosis not present

## 2020-10-09 DIAGNOSIS — T148XXA Other injury of unspecified body region, initial encounter: Secondary | ICD-10-CM

## 2020-10-09 DIAGNOSIS — Z79899 Other long term (current) drug therapy: Secondary | ICD-10-CM | POA: Insufficient documentation

## 2020-10-09 DIAGNOSIS — S4992XA Unspecified injury of left shoulder and upper arm, initial encounter: Secondary | ICD-10-CM | POA: Diagnosis not present
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
## 2020-10-09 NOTE — Discharge Instructions (Signed)
Recommend Tylenol or Motrin for pain control.  Follow-up with your primary care doctor as needed.  If you develop worsening chest pain, difficulty breathing, abdominal pain or other new concerning symptom, come back to ER for reassessment.
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
## 2020-10-18 DIAGNOSIS — M5032 Other cervical disc degeneration, mid-cervical region, unspecified level: Secondary | ICD-10-CM | POA: Diagnosis not present

## 2020-10-18 DIAGNOSIS — M530 Cervicocranial syndrome: Secondary | ICD-10-CM | POA: Diagnosis not present

## 2020-10-18 DIAGNOSIS — M9901 Segmental and somatic dysfunction of cervical region: Secondary | ICD-10-CM | POA: Diagnosis not present

## 2020-10-18 DIAGNOSIS — M9902 Segmental and somatic dysfunction of thoracic region: Secondary | ICD-10-CM | POA: Diagnosis not present

## 2020-10-21 DIAGNOSIS — N3080 Other cystitis without hematuria: Secondary | ICD-10-CM | POA: Diagnosis not present

## 2020-10-27 ENCOUNTER — Ambulatory Visit
Admission: RE | Admit: 2020-10-27 | Discharge: 2020-10-27 | Disposition: A | Discharge status: Home / Self Care | Payer: BC Managed Care – PPO | Source: Ambulatory Visit | Attending: Internal Medicine | Admitting: Internal Medicine

## 2020-10-27 ENCOUNTER — Other Ambulatory Visit: Payer: Self-pay

## 2020-10-27 DIAGNOSIS — Z1231 Encounter for screening mammogram for malignant neoplasm of breast: Secondary | ICD-10-CM

## 2020-11-02 ENCOUNTER — Other Ambulatory Visit: Payer: Self-pay | Admitting: *Deleted

## 2020-11-02 ENCOUNTER — Other Ambulatory Visit: Payer: Self-pay | Admitting: Adult Health

## 2020-11-02 DIAGNOSIS — E079 Disorder of thyroid, unspecified: Secondary | ICD-10-CM

## 2020-11-02 MED ORDER — LEVOTHYROXINE SODIUM 150 MCG PO TABS: ORAL_TABLET | ORAL | 0 refills | Status: DC

## 2020-11-15 DIAGNOSIS — M9902 Segmental and somatic dysfunction of thoracic region: Secondary | ICD-10-CM | POA: Diagnosis not present

## 2020-11-15 DIAGNOSIS — M9901 Segmental and somatic dysfunction of cervical region: Secondary | ICD-10-CM | POA: Diagnosis not present

## 2020-11-15 DIAGNOSIS — M5032 Other cervical disc degeneration, mid-cervical region, unspecified level: Secondary | ICD-10-CM | POA: Diagnosis not present

## 2020-11-15 DIAGNOSIS — M530 Cervicocranial syndrome: Secondary | ICD-10-CM | POA: Diagnosis not present

## 2020-12-14 DIAGNOSIS — M530 Cervicocranial syndrome: Secondary | ICD-10-CM | POA: Diagnosis not present

## 2020-12-14 DIAGNOSIS — M9901 Segmental and somatic dysfunction of cervical region: Secondary | ICD-10-CM | POA: Diagnosis not present

## 2020-12-14 DIAGNOSIS — M9902 Segmental and somatic dysfunction of thoracic region: Secondary | ICD-10-CM | POA: Diagnosis not present

## 2020-12-14 DIAGNOSIS — M5032 Other cervical disc degeneration, mid-cervical region, unspecified level: Secondary | ICD-10-CM | POA: Diagnosis not present

## 2021-01-05 ENCOUNTER — Other Ambulatory Visit: Payer: Self-pay | Admitting: Nurse Practitioner

## 2021-01-05 DIAGNOSIS — F3341 Major depressive disorder, recurrent, in partial remission: Secondary | ICD-10-CM

## 2021-01-05 MED ORDER — BUPROPION HCL ER (XL) 150 MG PO TB24: ORAL_TABLET | ORAL | 3 refills | Status: DC

## 2021-01-05 NOTE — Progress Notes (Signed)
Pt requested refill of Wellbutrin XR to be decreased to 150 daily.  Needed Rx sent to [facility] pill pack for next disbursement.

## 2021-01-15 DIAGNOSIS — Z471 Aftercare following joint replacement surgery: Secondary | ICD-10-CM | POA: Diagnosis not present

## 2021-01-15 DIAGNOSIS — Z96651 Presence of right artificial knee joint: Secondary | ICD-10-CM | POA: Diagnosis not present

## 2021-01-15 DIAGNOSIS — M1712 Unilateral primary osteoarthritis, left knee: Secondary | ICD-10-CM | POA: Diagnosis not present

## 2021-01-15 DIAGNOSIS — M25562 Pain in left knee: Secondary | ICD-10-CM | POA: Diagnosis not present

## 2021-01-18 DIAGNOSIS — M9901 Segmental and somatic dysfunction of cervical region: Secondary | ICD-10-CM | POA: Diagnosis not present

## 2021-01-18 DIAGNOSIS — M9902 Segmental and somatic dysfunction of thoracic region: Secondary | ICD-10-CM | POA: Diagnosis not present

## 2021-01-18 DIAGNOSIS — M530 Cervicocranial syndrome: Secondary | ICD-10-CM | POA: Diagnosis not present

## 2021-01-18 DIAGNOSIS — M5032 Other cervical disc degeneration, mid-cervical region, unspecified level: Secondary | ICD-10-CM | POA: Diagnosis not present

## 2021-01-29 ENCOUNTER — Other Ambulatory Visit: Payer: Self-pay

## 2021-01-29 DIAGNOSIS — E079 Disorder of thyroid, unspecified: Secondary | ICD-10-CM

## 2021-01-29 MED ORDER — LEVOTHYROXINE SODIUM 150 MCG PO TABS: ORAL_TABLET | ORAL | 0 refills | Status: DC

## 2021-02-20 NOTE — Progress Notes (Signed)
FOLLOW UP  Assessment and Plan:   Hypertension Bp running 110's/70's.  Will stop HCTZ and continue Olmesartan. Monitor blood pressure at home; patient to call if consistently greater than 130/80 Continue DASH diet.   Reminder to go to the ER if any CP, SOB, nausea, dizziness, severe HA, changes vision/speech, left arm numbness and tingling and jaw pain. CBC CMP  Cholesterol Currently not at goal;  Continue low cholesterol diet and exercise.  Check lipid panel.   Abnormal glucose Continue diet and exercise.  Check A1C  Obesity with co morbidities Long discussion about weight loss, diet, and exercise Recommended diet heavy in fruits and veggies and low in animal meats, cheeses, and dairy products, appropriate calorie intake Pt plans to resume water fitness classes at [employer]  Depression Doing well on Wellbutrin, symptoms well controlled  Thyroid Disease TSH Continue Synthroid dosage and will adjust accordingly pending labs  Vitamin D Def At goal at last visit; continue supplementation to maintain goal of 60-100  Vit D level  Continue diet and meds as discussed. Further disposition pending results of labs. Discussed med's effects and SE's.   Over 30 minutes of exam, counseling, chart review, and critical decision making was performed.   Future Appointments  Date Time Provider [place]  08/09/2021  9:00 AM Magda Bernheim, NP [facility] None    ----------------------------------------------------------------------------------------------------------------------  HPI 60 y.o. female  presents for 6 month follow up on hypertension, cholesterol, diabetes, weight and vitamin D deficiency.   BMI is Body mass index is 40.84 kg/m., she has been working on diet and exercise. Has been heavy her entire life, even as a child. Walks approx 30 minutes a day with dogs. Wt Readings from Last 3 Encounters:  02/21/21 268 lb 9.6 oz (121.8 kg)  10/09/20 260 lb (117.9 kg)   09/21/20 274 lb (124.3 kg)    Her blood pressure has been controlled at home, Bp's running 110's/70's today their BP is BP: 114/74 BP Readings from Last 3 Encounters:  02/21/21 114/74  10/09/20 (!) 162/108  09/21/20 118/71     She does workout. She denies chest pain, shortness of breath, dizziness.   She is on cholesterol medication  Her cholesterol is not at goal. The cholesterol last visit was:   Lab Results  Component Value Date   CHOL 195 08/09/2020   HDL 57 08/09/2020   LDLCALC 116 (H) 08/09/2020   TRIG 111 08/09/2020   CHOLHDL 3.4 08/09/2020    She has been working on diet and exercise for prediabetes,  Last A1C in the office was:  Lab Results  Component Value Date   HGBA1C 5.7 (H) 08/09/2020   Patient is on Vitamin D supplement.   Lab Results  Component Value Date   VD25OH 95 08/09/2020        Current Medications:  Current Outpatient Medications on File Prior to Visit  Medication Sig   aspirin 81 MG chewable tablet Chew by mouth daily.   buPROPion (WELLBUTRIN XL) 150 MG 24 hr tablet Take 1 tablet every Morning for Mood   Cholecalciferol (VITAMIN D PO) Take 5,000 Int'l Units by mouth daily.   diazepam (VALIUM) 5 MG tablet 1/2-1 pill every 8 hours for dizziness/vertigo (Patient taking differently: 1/2-1 pill every 8 hours for dizziness/vertigo  PRN)   ECHINACEA-BIOFLAVANOIDS-VIT C PO Take 500 mg by mouth.   hydrochlorothiazide (HYDRODIURIL) 25 MG tablet Take 1 tablet by mouth daily for blood pressure and fluid retention/ankle swelling.   levothyroxine (SYNTHROID) 150 MCG  tablet Take 1/2 tablet  on Tuesday and Thursday  &  take 1 tablet  on all other days - Take  on an empty stomach with only water for 30 minutes & no Antacid meds, Calcium or Magnesium for 4 hours & avoid Biotin   olmesartan (BENICAR) 20 MG tablet Take 20 mg by mouth daily.   Omega-3 Fatty Acids (FISH OIL) 1200 MG CPDR Take by mouth.   Zinc 50 MG CAPS Take by mouth.   No current  facility-administered medications on file prior to visit.     Allergies:  Allergies  Allergen Reactions   Augmentin [Amoxicillin-Pot Clavulanate] Nausea And Vomiting     Medical History:  Past Medical History:  Diagnosis Date   Chronic kidney disease    kidney stones    Depression    mild on Bupropion    Hypertension    PONV (postoperative nausea and vomiting)    Thyroid disease    Family history- Reviewed and unchanged Social history- Reviewed and unchanged   Review of Systems:  Review of Systems  Constitutional:  Negative for chills and fever.  HENT:  Positive for tinnitus. Negative for congestion, hearing loss, sinus pain and sore throat.   Eyes:  Negative for blurred vision and double vision.  Respiratory:  Negative for cough, hemoptysis, sputum production, shortness of breath and wheezing.   Cardiovascular:  Negative for chest pain, palpitations and leg swelling.  Gastrointestinal:  Negative for abdominal pain, constipation, diarrhea, heartburn, nausea and vomiting.  Genitourinary:  Negative for dysuria and urgency.  Musculoskeletal:  Positive for back pain (muscle strain last week). Negative for falls, joint pain, myalgias and neck pain.  Skin:  Negative for rash.  Neurological:  Positive for dizziness (vertigo intermittent) and sensory change (decreased in feet bilaterally). Negative for tremors, weakness and headaches.  Endo/Heme/Allergies:  Does not bruise/bleed easily.  Psychiatric/Behavioral:  Negative for depression and suicidal ideas. The patient has insomnia (Wakes up throughput night). The patient is not nervous/anxious.      Physical Exam: BP 114/74   Pulse 73   Temp 97.7 F (36.5 C)   Wt 268 lb 9.6 oz (121.8 kg)   SpO2 97%   BMI 40.84 kg/m  Wt Readings from Last 3 Encounters:  02/21/21 268 lb 9.6 oz (121.8 kg)  10/09/20 260 lb (117.9 kg)  09/21/20 274 lb (124.3 kg)   General Appearance: Well nourished, in no apparent distress. Eyes: PERRLA,  EOMs, conjunctiva no swelling or erythema Sinuses: No Frontal/maxillary tenderness ENT/Mouth: Ext aud canals clear, TMs without erythema, bulging. No erythema, swelling, or exudate on post pharynx.  Tonsils not swollen or erythematous. Hearing normal.  Neck: Supple, thyroid normal.  Respiratory: Respiratory effort normal, BS equal bilaterally without rales, rhonchi, wheezing or stridor.  Cardio: RRR with no MRGs. Brisk peripheral pulses without edema.  Abdomen: Soft, + BS.  Non tender, no guarding, rebound, hernias, masses. Lymphatics: Non tender without lymphadenopathy.  Musculoskeletal: Full ROM, 5/5 strength, Normal gait Skin: Warm, dry without rashes, lesions, ecchymosis.  Neuro: Cranial nerves intact. No cerebellar symptoms. Sensation intact. Psych: Awake and oriented X 3, normal affect, Insight and Judgment appropriate.    Magda Bernheim, NP 10:40 AM [facility],

## 2021-02-21 ENCOUNTER — Ambulatory Visit: Payer: BC Managed Care – PPO | Admitting: Nurse Practitioner

## 2021-02-21 ENCOUNTER — Other Ambulatory Visit: Payer: Self-pay

## 2021-02-21 ENCOUNTER — Encounter: Payer: Self-pay | Admitting: Nurse Practitioner

## 2021-02-21 VITALS — BP 114/74 | HR 73 | Temp 97.7°F | Wt 268.6 lb

## 2021-02-21 DIAGNOSIS — E785 Hyperlipidemia, unspecified: Secondary | ICD-10-CM | POA: Diagnosis not present

## 2021-02-21 DIAGNOSIS — F3341 Major depressive disorder, recurrent, in partial remission: Secondary | ICD-10-CM | POA: Diagnosis not present

## 2021-02-21 DIAGNOSIS — E559 Vitamin D deficiency, unspecified: Secondary | ICD-10-CM

## 2021-02-21 DIAGNOSIS — I10 Essential (primary) hypertension: Secondary | ICD-10-CM

## 2021-02-21 DIAGNOSIS — R7309 Other abnormal glucose: Secondary | ICD-10-CM

## 2021-02-21 DIAGNOSIS — Z79899 Other long term (current) drug therapy: Secondary | ICD-10-CM

## 2021-02-21 DIAGNOSIS — E079 Disorder of thyroid, unspecified: Secondary | ICD-10-CM

## 2021-02-21 NOTE — Patient Instructions (Signed)
Stop HCTZ and monitor BP daily.  If consistently greater than 130/80 call the office [place]   Know what a healthy weight is for you (roughly BMI <25) and aim to maintain this   Aim for 7+ servings of fruits and vegetables daily   70-80+ fluid ounces of water or unsweet tea for healthy kidneys   Limit to max 1 drink of alcohol per day; avoid smoking/tobacco   Limit animal fats in diet for cholesterol and heart health - choose grass fed whenever available   Avoid highly processed foods, and foods high in saturated/trans fats   Aim for low stress - take time to unwind and care for your mental health   Aim for 150 min of moderate intensity exercise weekly for heart health, and weights twice weekly for bone health   Aim for 7-9 hours of sleep daily

## 2021-02-22 DIAGNOSIS — M9901 Segmental and somatic dysfunction of cervical region: Secondary | ICD-10-CM | POA: Diagnosis not present

## 2021-02-22 DIAGNOSIS — M5032 Other cervical disc degeneration, mid-cervical region, unspecified level: Secondary | ICD-10-CM | POA: Diagnosis not present

## 2021-02-22 DIAGNOSIS — M530 Cervicocranial syndrome: Secondary | ICD-10-CM | POA: Diagnosis not present

## 2021-02-22 DIAGNOSIS — M9902 Segmental and somatic dysfunction of thoracic region: Secondary | ICD-10-CM | POA: Diagnosis not present

## 2021-02-22 LAB — CBC WITH DIFFERENTIAL/PLATELET
Absolute Monocytes: 725 cells/uL (ref 200–950)
Basophils Absolute: 59 cells/uL (ref 0–200)
Basophils Relative: 0.8 %
Eosinophils Absolute: 163 cells/uL (ref 15–500)
Eosinophils Relative: 2.2 %
HCT: 46.7 % — ABNORMAL HIGH (ref 35.0–45.0)
Hemoglobin: 15.7 g/dL — ABNORMAL HIGH (ref 11.7–15.5)
Lymphs Abs: 1598 cells/uL (ref 850–3900)
MCH: 30.9 pg (ref 27.0–33.0)
MCHC: 33.6 g/dL (ref 32.0–36.0)
MCV: 91.9 fL (ref 80.0–100.0)
MPV: 10.8 fL (ref 7.5–12.5)
Monocytes Relative: 9.8 %
Neutro Abs: 4854 cells/uL (ref 1500–7800)
Neutrophils Relative %: 65.6 %
Platelets: 261 10*3/uL (ref 140–400)
RBC: 5.08 10*6/uL (ref 3.80–5.10)
RDW: 13.4 % (ref 11.0–15.0)
Total Lymphocyte: 21.6 %
WBC: 7.4 10*3/uL (ref 3.8–10.8)

## 2021-02-22 LAB — COMPLETE METABOLIC PANEL WITH GFR
AG Ratio: 2.2 (calc) (ref 1.0–2.5)
ALT: 66 U/L — ABNORMAL HIGH (ref 6–29)
AST: 35 U/L (ref 10–35)
Albumin: 4.4 g/dL (ref 3.6–5.1)
Alkaline phosphatase (APISO): 92 U/L (ref 37–153)
BUN: 14 mg/dL (ref 7–25)
CO2: 29 mmol/L (ref 20–32)
Calcium: 9.5 mg/dL (ref 8.6–10.4)
Chloride: 102 mmol/L (ref 98–110)
Creat: 0.78 mg/dL (ref 0.50–1.03)
Globulin: 2 g/dL (calc) (ref 1.9–3.7)
Glucose, Bld: 83 mg/dL (ref 65–99)
Potassium: 4.3 mmol/L (ref 3.5–5.3)
Sodium: 138 mmol/L (ref 135–146)
Total Bilirubin: 0.4 mg/dL (ref 0.2–1.2)
Total Protein: 6.4 g/dL (ref 6.1–8.1)
eGFR: 88 mL/min/{1.73_m2} (ref >=60)

## 2021-02-22 LAB — LIPID PANEL
Cholesterol: 193 mg/dL (ref <200)
HDL: 55 mg/dL (ref >=50)
LDL Cholesterol (Calc): 116 mg/dL (calc) — ABNORMAL HIGH
Non-HDL Cholesterol (Calc): 138 mg/dL (calc) — ABNORMAL HIGH (ref <130)
Total CHOL/HDL Ratio: 3.5 (calc) (ref <5.0)
Triglycerides: 116 mg/dL (ref <150)

## 2021-02-22 LAB — VITAMIN D 25 HYDROXY (VIT D DEFICIENCY, FRACTURES): Vit D, 25-Hydroxy: 106 ng/mL — ABNORMAL HIGH (ref 30–100)

## 2021-02-22 LAB — HEMOGLOBIN A1C
Hgb A1c MFr Bld: 5.6 % of total Hgb (ref <5.7)
Mean Plasma Glucose: 114 mg/dL
eAG (mmol/L): 6.3 mmol/L

## 2021-02-22 LAB — TSH: TSH: 0.55 mIU/L (ref 0.40–4.50)

## 2021-04-19 DIAGNOSIS — M9901 Segmental and somatic dysfunction of cervical region: Secondary | ICD-10-CM | POA: Diagnosis not present

## 2021-04-19 DIAGNOSIS — M530 Cervicocranial syndrome: Secondary | ICD-10-CM | POA: Diagnosis not present

## 2021-04-19 DIAGNOSIS — M9902 Segmental and somatic dysfunction of thoracic region: Secondary | ICD-10-CM | POA: Diagnosis not present

## 2021-04-19 DIAGNOSIS — M5032 Other cervical disc degeneration, mid-cervical region, unspecified level: Secondary | ICD-10-CM | POA: Diagnosis not present

## 2021-05-30 ENCOUNTER — Other Ambulatory Visit: Payer: Self-pay

## 2021-05-30 DIAGNOSIS — E079 Disorder of thyroid, unspecified: Secondary | ICD-10-CM

## 2021-05-30 MED ORDER — LEVOTHYROXINE SODIUM 150 MCG PO TABS: ORAL_TABLET | ORAL | 0 refills | Status: DC

## 2021-06-26 ENCOUNTER — Other Ambulatory Visit: Payer: Self-pay | Admitting: Nurse Practitioner

## 2021-06-26 ENCOUNTER — Encounter: Payer: Self-pay | Admitting: Nurse Practitioner

## 2021-06-26 DIAGNOSIS — F3341 Major depressive disorder, recurrent, in partial remission: Secondary | ICD-10-CM

## 2021-06-26 MED ORDER — BUPROPION HCL ER (XL) 300 MG PO TB24: 300.0000 mg | ORAL_TABLET | ORAL | 2 refills | Status: DC

## 2021-06-26 MED ORDER — BUPROPION HCL ER (XL) 150 MG PO TB24: ORAL_TABLET | ORAL | 1 refills | Status: DC

## 2021-07-05 ENCOUNTER — Ambulatory Visit (INDEPENDENT_AMBULATORY_CARE_PROVIDER_SITE_OTHER): Payer: 59 | Admitting: Nurse Practitioner

## 2021-07-05 ENCOUNTER — Encounter: Payer: Self-pay | Admitting: Nurse Practitioner

## 2021-07-05 ENCOUNTER — Other Ambulatory Visit: Payer: Self-pay

## 2021-07-05 VITALS — BP 122/76 | HR 89 | Temp 97.9°F | Resp 17 | Ht 68.0 in | Wt 278.2 lb

## 2021-07-05 DIAGNOSIS — R109 Unspecified abdominal pain: Secondary | ICD-10-CM | POA: Diagnosis not present

## 2021-07-05 DIAGNOSIS — Z87442 Personal history of urinary calculi: Secondary | ICD-10-CM | POA: Diagnosis not present

## 2021-07-05 DIAGNOSIS — R1031 Right lower quadrant pain: Secondary | ICD-10-CM | POA: Diagnosis not present

## 2021-07-05 MED ORDER — NITROFURANTOIN MONOHYD MACRO 100 MG PO CAPS: 100.0000 mg | ORAL_CAPSULE | Freq: Two times a day (BID) | ORAL | 0 refills | Status: AC

## 2021-07-05 NOTE — Progress Notes (Signed)
Assessment and Plan:  Jenny Arroyo was seen today for acute visit.  Diagnoses and all orders for this visit:  Left flank pain/History of kidney stones Push fluids Treat with Macrobid empirically for UTI If develop fever, inability to urinate go to the ER -     CT Abdomen Pelvis Wo Contrast; Future - Urine culture  Right lower quadrant abdominal pain Pain is intermittent and is an aching sensation in right lower quadrant , take tylenol as needed and monitor -     CT Abdomen Pelvis Wo Contrast; Future       Further disposition pending results of labs. Discussed med's effects and SE's.   Over 30 minutes of exam, counseling, chart review, and critical decision making was performed.   Future Appointments  Date Time Provider [place]  08/09/2021  9:00 AM Magda Bernheim, NP [facility] None    ------------------------------------------------------------------------------------------------------------------   HPI BP 122/76    Pulse 89    Temp 97.9 F (36.6 C)    Resp 17    Ht 5\' 8"  (1.727 m)    Wt 278 lb 3.2 oz (126.2 kg)    SpO2 95%    BMI 42.30 kg/m  61 y.o.female presents for left flank pain which she describes as consistent dullness with intermittent sharp stabbing pain.  She has a history of kidney stones approx. 15 years ago.  Lithotripsy x 1, passed several other. Denies hematuria, difficulty passing urine, nausea, diarrhea and constipation. She is also experiencing right lower quadrant pain which she describes as an aching sensation and is intermittent.  This has occurred on and off for several years but is more prominent recently.  Past Medical History:  Diagnosis Date   Chronic kidney disease    kidney stones    Depression    mild on Bupropion    Hypertension    PONV (postoperative nausea and vomiting)    Thyroid disease      Allergies  Allergen Reactions   Augmentin [Amoxicillin-Pot Clavulanate] Nausea And Vomiting    Current Outpatient Medications on File  Prior to Visit  Medication Sig   aspirin 81 MG chewable tablet Chew by mouth daily.   buPROPion (WELLBUTRIN XL) 150 MG 24 hr tablet Take 1 tablet every Morning for Mood   buPROPion (WELLBUTRIN XL) 300 MG 24 hr tablet Take 1 tablet (300 mg total) by mouth every morning.   Cholecalciferol (VITAMIN D PO) Take 5,000 Int'l Units by mouth daily.   diazepam (VALIUM) 5 MG tablet 1/2-1 pill every 8 hours for dizziness/vertigo (Patient taking differently: 1/2-1 pill every 8 hours for dizziness/vertigo  PRN)   levothyroxine (SYNTHROID) 150 MCG tablet Take 1/2 tablet  on Tuesday and Thursday  &  take 1 tablet  on all other days - Take  on an empty stomach with only water for 30 minutes & no Antacid meds, Calcium or Magnesium for 4 hours & avoid Biotin   olmesartan (BENICAR) 20 MG tablet Take 20 mg by mouth daily.   Omega-3 Fatty Acids (FISH OIL) 1200 MG CPDR Take by mouth.   Zinc 50 MG CAPS Take by mouth.   ECHINACEA-BIOFLAVANOIDS-VIT C PO Take 500 mg by mouth. (Patient not taking: Reported on 07/05/2021)   hydrochlorothiazide (HYDRODIURIL) 25 MG tablet Take 1 tablet by mouth daily for blood pressure and fluid retention/ankle swelling. (Patient not taking: Reported on 07/05/2021)   No current facility-administered medications on file prior to visit.    ROS: all negative except above.   Physical Exam:  BP 122/76    Pulse 89    Temp 97.9 F (36.6 C)    Resp 17    Ht 5\' 8"  (1.727 m)    Wt 278 lb 3.2 oz (126.2 kg)    SpO2 95%    BMI 42.30 kg/m   General Appearance: Well nourished, in no apparent distress. Eyes: PERRLA, EOMs, conjunctiva no swelling or erythema Sinuses: No Frontal/maxillary tenderness ENT/Mouth: Ext aud canals clear, TMs without erythema, bulging. No erythema, swelling, or exudate on post pharynx.  Tonsils not swollen or erythematous. Hearing normal.  Neck: Supple, thyroid normal.  Respiratory: Respiratory effort normal, BS equal bilaterally without rales, rhonchi, wheezing or stridor.   Cardio: RRR with no MRGs. Brisk peripheral pulses without edema.  Abdomen: Soft, + BS.  Non tender, no guarding, rebound, hernias, masses. Lymphatics: Non tender without lymphadenopathy.  Musculoskeletal: Full ROM, 5/5 strength, normal gait. + CVA tenderness on the left Skin: Warm, dry without rashes, lesions, ecchymosis.  Neuro: Cranial nerves intact. Normal muscle tone, no cerebellar symptoms. Sensation intact.  Psych: Awake and oriented X 3, normal affect, Insight and Judgment appropriate.     Magda Bernheim, NP 2:18 PM [facility]

## 2021-07-06 LAB — URINE CULTURE
MICRO NUMBER:: 12925085
SPECIMEN QUALITY:: ADEQUATE

## 2021-07-09 NOTE — Addendum Note (Signed)
Addended by: Magda Bernheim on: 07/09/2021 11:36 AM   Modules accepted: Orders

## 2021-07-10 ENCOUNTER — Ambulatory Visit: Payer: 59 | Admitting: Nurse Practitioner

## 2021-07-11 ENCOUNTER — Encounter: Payer: Self-pay | Admitting: Radiology

## 2021-07-11 ENCOUNTER — Ambulatory Visit
Admission: RE | Admit: 2021-07-11 | Discharge: 2021-07-11 | Disposition: A | Discharge status: Home / Self Care | Payer: 59 | Source: Ambulatory Visit | Attending: Nurse Practitioner | Admitting: Nurse Practitioner

## 2021-07-11 DIAGNOSIS — Z87442 Personal history of urinary calculi: Secondary | ICD-10-CM

## 2021-07-11 DIAGNOSIS — R109 Unspecified abdominal pain: Secondary | ICD-10-CM | POA: Diagnosis not present

## 2021-07-11 DIAGNOSIS — K760 Fatty (change of) liver, not elsewhere classified: Secondary | ICD-10-CM | POA: Diagnosis not present

## 2021-07-11 DIAGNOSIS — R1031 Right lower quadrant pain: Secondary | ICD-10-CM

## 2021-07-27 ENCOUNTER — Other Ambulatory Visit: Payer: BC Managed Care – PPO

## 2021-07-29 ENCOUNTER — Other Ambulatory Visit: Payer: Self-pay | Admitting: Adult Health Nurse Practitioner

## 2021-08-07 NOTE — Progress Notes (Signed)
Complete Physical  She would prefer to not go on a medication, will prefer close follow up and diet/life style modification. Patient is very motivated and has a good outlook.   Assessment and Plan:  Encounter for general adult medical examination with abnormal findings 1 year Call to schedule mammogram  Morbid obesity ([place]) - follow up 3 months for progress monitoring - increase veggies, decrease carbs - long discussion about weight loss, diet, and exercise  Thyroid disease Taking levothyroxine 150 mcg half tblet Tues & Thurs, whole tablet five days a week. Reminder to take on an empty stomach 30-61mins before first meal of the day. No antacid medications for 4 hours. -     TSH   Other abnormal glucose Discussed dietary and exercise modifications -     Hemoglobin A1c  Essential hypertension Continue current medications: olmesartan 20 mg daily Monitor blood pressure at home; call if consistently over 130/80 Continue DASH diet.   Reminder to go to the ER if any CP, SOB, nausea, dizziness, severe HA, changes vision/speech, left arm numbness and tingling and jaw pain. -     CBC with Differential/Platelet -     COMPLETE METABOLIC PANEL WITH GFR -     TSH -     EKG 12-Lead - continue medications, DASH diet, exercise and monitor at home. Call if greater than 130/80.   Hyperlipidemia, unspecified hyperlipidemia type -     Lipid panel -     EKG 12-Lead check lipids decrease fatty foods increase activity.   Elevated Hematocrit - CBC - Can do blood donation to keep in normal range - Will send to hematology for further evaluation if HCT over 50  Depression, major, recurrent, in partial remission (HCC) Continue medications and exercise  Medication management -     Magnesium  Vitamin D deficiency -Continue supplementation to maintain goal of 70-100 Taking Vitamin D 5,000 IU daily -Vitamin D  Vertigo -     diazepam (VALIUM) 5 MG tablet; 1/2-1 pill every 8 hours for  dizziness/vertigo - has long history, uses valium PRN, normal neuro Can do vestibular therapy  Screening for blood or protein in urine - Routine UA with reflex microscopic - Microalbumin/creatinine urine ratio  Screening for ischemic heart disease - EKG  Screening for Cervical Cancer - Pap smear taken and submitted  Sebaceous cyst labia majora - 3 small(< .5cm) right labia major - Can simply monitor, advised may be able to express small amount of sebum after hot bath -If areas get larger or become tender please notify the office  Further disposition pending results if labs check today. Discussed med's effects and SE's.   Over 30 minutes of face to face interview, exam, counseling, chart review, and critical decision making was performed.   Future Appointments  Date Time Provider [place]  08/12/2022  9:00 AM Magda Bernheim, NP [facility] None     HPI 61 y.o. female  presents for a complete physical.   She has history of vertigo, will use valium very infrequently for this. Vertigo only with turning over in bed to the left.  She has tinnitus but no hearing loss.   She has 2 small bumps on labia which do not hurt or itch.    Her blood pressure has been controlled at home, she is on Olmesartan 20 mg QD their BP is BP: 118/74  BP Readings from Last 3 Encounters:  08/09/21 118/74  07/05/21 122/76  02/21/21 114/74  She does workout. She denies chest  pain, shortness of breath, dizziness.   She could not tolerate contrave due to constipation. She can not tolerate phentermine.  She is on wellbutrin everyday to help with weight.  She is walking her dog 30 minutes everyday.  She has been going to the pool at [facility] [employer]- 45 minutes doing water aerobics Wt Readings from Last 3 Encounters:  08/09/21 277 lb 12.8 oz (126 kg)  07/05/21 278 lb 3.2 oz (126.2 kg)  02/21/21 268 lb 9.6 oz (121.8 kg)   She had RTK with Dr. Deanne Coffer 2019 in [place]. Has some left knee pain,  but has been increasing her walking and walks her dogs. She could have a replacement on the left but is not ready as pain is tolerable.    She is not on cholesterol medication and denies myalgias. Her cholesterol is at goal. The cholesterol last visit was:   Lab Results  Component Value Date   CHOL 193 02/21/2021   HDL 55 02/21/2021   LDLCALC 116 (H) 02/21/2021   TRIG 116 02/21/2021   CHOLHDL 3.5 02/21/2021   Last A1C in the office was:  Lab Results  Component Value Date   HGBA1C 5.6 02/21/2021   Patient is on Vitamin D supplement, 10,000.   Lab Results  Component Value Date   VD25OH 106 (H) 02/21/2021    She is on thyroid medication. Her medication was not changed last visit. She is on 1/2 tablet Tues, Thursday and other days 1 tablet, no biotin supplement. Patient denies heat / cold intolerance, nervousness and palpitations.  Lab Results  Component Value Date   TSH 0.55 02/21/2021  .  Current Medications:  Current Outpatient Medications on File Prior to Visit  Medication Sig Dispense Refill   Ascorbic Acid (VITAMIN C) 1000 MG tablet Take 1,000 mg by mouth daily.     aspirin 81 MG chewable tablet Chew by mouth daily.     buPROPion (WELLBUTRIN XL) 300 MG 24 hr tablet Take 1 tablet (300 mg total) by mouth every morning. 90 tablet 2   Cholecalciferol (VITAMIN D PO) Take 5,000 Int'l Units by mouth daily.     diazepam (VALIUM) 5 MG tablet 1/2-1 pill every 8 hours for dizziness/vertigo (Patient taking differently: 1/2-1 pill every 8 hours for dizziness/vertigo  PRN) 30 tablet 0   levothyroxine (SYNTHROID) 150 MCG tablet Take 1/2 tablet  on Tuesday and Thursday  &  take 1 tablet  on all other days - Take  on an empty stomach with only water for 30 minutes & no Antacid meds, Calcium or Magnesium for 4 hours & avoid Biotin 90 tablet 0   olmesartan (BENICAR) 20 MG tablet Take  1 tablet  Daily  for BP                                                           /                              TAKE                BY MOUTH 90 tablet 3   Omega-3 Fatty Acids (FISH OIL) 1200 MG CPDR Take by mouth.     Zinc 50 MG CAPS Take by mouth.  buPROPion (WELLBUTRIN XL) 150 MG 24 hr tablet Take 1 tablet every Morning for Mood (Patient not taking: Reported on 08/09/2021) 30 tablet 1   ECHINACEA-BIOFLAVANOIDS-VIT C PO Take 500 mg by mouth. (Patient not taking: Reported on 07/05/2021)     hydrochlorothiazide (HYDRODIURIL) 25 MG tablet Take 1 tablet by mouth daily for blood pressure and fluid retention/ankle swelling. (Patient not taking: Reported on 07/05/2021) 90 tablet 1   No current facility-administered medications on file prior to visit.   Health Maintenance:   Immunization History  Administered Date(s) Administered   Influenza Split 05/10/2014   PFIZER(Purple Top)SARS-COV-2 Vaccination 09/20/2019, 10/11/2019   Td 06/11/2007   Tdap 06/18/2018   Tetanus: 2020 Pneumovax:N/A Prevnar 13:N/A Flu vaccine: 2020 shingrix discussed  Pap: 06/2018 neg HPV , repeat today  Patient's last menstrual period was 10/24/2017. MGM: 08/2020 normal, call to schedule DEXA: n/A Colonoscopy: [facility] in [place]- 09/21/20 due 2027 EGD:N/A  Medical History:  Past Medical History:  Diagnosis Date   Chronic kidney disease    kidney stones    Depression    mild on Bupropion    Hypertension    PONV (postoperative nausea and vomiting)    Thyroid disease    Allergies Allergies  Allergen Reactions   Augmentin [Amoxicillin-Pot Clavulanate] Nausea And Vomiting   SURGICAL HISTORY She  has a past surgical history that includes Knee arthroscopy (Right, 2015); Replacement total knee (Right, 04/2018); Tonsillectomy; Appendectomy; Cholecystectomy; Ectopic pregnancy surgery; Cesarean section; and Lithotripsy. FAMILY HISTORY Her family history includes Alzheimer's disease (age of onset: 69) in her maternal grandfather; Alzheimer's disease (age of onset: 79) in her mother; Arthritis in her father; Cancer (age of  onset: 87) in her father; Colon cancer (age of onset: 52) in her father; Depression in her brother; Gout in her father; Hypertension in her father and mother; Mental illness in her daughter; Pancreatic cancer in her father. SOCIAL HISTORY She  reports that she has never smoked. She has never used smokeless tobacco. She reports that she does not drink alcohol and does not use drugs.  Review of Systems  Constitutional: Negative.  Negative for chills and fever.  HENT: Negative.  Negative for congestion, hearing loss, sinus pain, sore throat and tinnitus.   Eyes: Negative.  Negative for blurred vision and double vision.  Respiratory: Negative.  Negative for cough, hemoptysis, sputum production, shortness of breath and wheezing.   Cardiovascular: Negative.  Negative for chest pain, palpitations and leg swelling.  Gastrointestinal:  Negative for abdominal pain, constipation, diarrhea, heartburn, nausea and vomiting.  Genitourinary: Negative.  Negative for dysuria and urgency.  Musculoskeletal:  Positive for joint pain (right knee RECENT SURGERY). Negative for back pain, falls, myalgias and neck pain.  Skin: Negative.  Negative for rash.  Neurological:  Positive for dizziness (intermittent vertigo). Negative for tingling, tremors, weakness and headaches.  Endo/Heme/Allergies:  Does not bruise/bleed easily.  Psychiatric/Behavioral: Negative.  Negative for depression and suicidal ideas. The patient is not nervous/anxious and does not have insomnia.    Physical Exam: Estimated body mass index is 42.87 kg/m as calculated from the following:   Height as of this encounter: 5' 7.5" (1.715 m).   Weight as of this encounter: 277 lb 12.8 oz (126 kg). BP 118/74    Pulse 79    Temp 97.6 F (36.4 C)    Ht 5' 7.5" (1.715 m)    Wt 277 lb 12.8 oz (126 kg)    [name] 10/24/2017    SpO2 96%    BMI 42.87  kg/m  General Appearance: Well nourished, in no apparent distress. Eyes: PERRLA, EOMs, conjunctiva no swelling or  erythema, normal fundi and vessels. Sinuses: No Frontal/maxillary tenderness ENT/Mouth: Ext aud canals clear, normal light reflex with TMs without erythema, bulging.  Good dentition. No erythema, swelling, or exudate on post pharynx. Tonsils not swollen or erythematous. Hearing normal.  Neck: Supple, thyroid normal. No bruits Respiratory: Respiratory effort normal, BS equal bilaterally without rales, rhonchi, wheezing or stridor. Breasts: breasts appear normal, no suspicious masses, no skin or nipple changes or axillary nodes.  Cardio: RRR without murmurs, rubs or gallops. Brisk peripheral pulses without edema.  Chest: symmetric, with normal excursions and percussion. Abdomen: Soft, +BS. Non tender, no guarding, rebound, hernias, masses, or organomegaly. .  Lymphatics: Non tender without lymphadenopathy.  Musculoskeletal: Full ROM all peripheral extremities,5/5 strength, and normal gait. Skin: Warm, dry without rashes, lesions, ecchymosis. Actinic keratosis noted to left chest. Neuro: Cranial nerves intact, reflexes equal bilaterally. Normal muscle tone, no cerebellar symptoms. Sensation intact.  Pelvic exam:  right labia majora has 3 very small sebaceous cysts-normal external genitalia, vulva, vagina, cervix, uterus and adnexa, PAP: Pap smear done today, thin-prep method.  Psych: Awake and oriented X 3, normal affect, Insight and Judgment appropriate.    EKG: NSR, no ST changes   [facility]  08/09/2021

## 2021-08-08 ENCOUNTER — Encounter: Payer: 59 | Admitting: Adult Health Nurse Practitioner

## 2021-08-09 ENCOUNTER — Encounter: Payer: Self-pay | Admitting: Nurse Practitioner

## 2021-08-09 ENCOUNTER — Other Ambulatory Visit: Payer: Self-pay

## 2021-08-09 ENCOUNTER — Ambulatory Visit (INDEPENDENT_AMBULATORY_CARE_PROVIDER_SITE_OTHER): Payer: 59 | Admitting: Nurse Practitioner

## 2021-08-09 VITALS — BP 118/74 | HR 79 | Temp 97.6°F | Ht 67.5 in | Wt 277.8 lb

## 2021-08-09 DIAGNOSIS — Z124 Encounter for screening for malignant neoplasm of cervix: Secondary | ICD-10-CM | POA: Diagnosis not present

## 2021-08-09 DIAGNOSIS — Z136 Encounter for screening for cardiovascular disorders: Secondary | ICD-10-CM

## 2021-08-09 DIAGNOSIS — E079 Disorder of thyroid, unspecified: Secondary | ICD-10-CM | POA: Diagnosis not present

## 2021-08-09 DIAGNOSIS — Z79899 Other long term (current) drug therapy: Secondary | ICD-10-CM | POA: Diagnosis not present

## 2021-08-09 DIAGNOSIS — Z1389 Encounter for screening for other disorder: Secondary | ICD-10-CM | POA: Diagnosis not present

## 2021-08-09 DIAGNOSIS — M79671 Pain in right foot: Secondary | ICD-10-CM

## 2021-08-09 DIAGNOSIS — R718 Other abnormality of red blood cells: Secondary | ICD-10-CM

## 2021-08-09 DIAGNOSIS — F3341 Major depressive disorder, recurrent, in partial remission: Secondary | ICD-10-CM

## 2021-08-09 DIAGNOSIS — E785 Hyperlipidemia, unspecified: Secondary | ICD-10-CM

## 2021-08-09 DIAGNOSIS — Z0000 Encounter for general adult medical examination without abnormal findings: Secondary | ICD-10-CM

## 2021-08-09 DIAGNOSIS — I10 Essential (primary) hypertension: Secondary | ICD-10-CM | POA: Diagnosis not present

## 2021-08-09 DIAGNOSIS — R7309 Other abnormal glucose: Secondary | ICD-10-CM

## 2021-08-09 DIAGNOSIS — E559 Vitamin D deficiency, unspecified: Secondary | ICD-10-CM

## 2021-08-09 DIAGNOSIS — Z0001 Encounter for general adult medical examination with abnormal findings: Secondary | ICD-10-CM

## 2021-08-09 DIAGNOSIS — N907 Vulvar cyst: Secondary | ICD-10-CM

## 2021-08-10 LAB — URINALYSIS, ROUTINE W REFLEX MICROSCOPIC
Bilirubin Urine: NEGATIVE
Glucose, UA: NEGATIVE
Hgb urine dipstick: NEGATIVE
Ketones, ur: NEGATIVE
Leukocytes,Ua: NEGATIVE
Nitrite: NEGATIVE
Protein, ur: NEGATIVE
Specific Gravity, Urine: 1.004 (ref 1.001–1.035)
pH: 6.5 (ref 5.0–8.0)

## 2021-08-10 LAB — COMPLETE METABOLIC PANEL WITH GFR
AG Ratio: 2 (calc) (ref 1.0–2.5)
ALT: 54 U/L — ABNORMAL HIGH (ref 6–29)
AST: 34 U/L (ref 10–35)
Albumin: 4.2 g/dL (ref 3.6–5.1)
Alkaline phosphatase (APISO): 101 U/L (ref 37–153)
BUN: 15 mg/dL (ref 7–25)
CO2: 27 mmol/L (ref 20–32)
Calcium: 9.7 mg/dL (ref 8.6–10.4)
Chloride: 105 mmol/L (ref 98–110)
Creat: 0.77 mg/dL (ref 0.50–1.03)
Globulin: 2.1 g/dL (calc) (ref 1.9–3.7)
Glucose, Bld: 104 mg/dL — ABNORMAL HIGH (ref 65–99)
Potassium: 4.3 mmol/L (ref 3.5–5.3)
Sodium: 140 mmol/L (ref 135–146)
Total Bilirubin: 0.4 mg/dL (ref 0.2–1.2)
Total Protein: 6.3 g/dL (ref 6.1–8.1)
eGFR: 89 mL/min/{1.73_m2} (ref >=60)

## 2021-08-10 LAB — CBC WITH DIFFERENTIAL/PLATELET
Absolute Monocytes: 616 cells/uL (ref 200–950)
Basophils Absolute: 74 cells/uL (ref 0–200)
Basophils Relative: 1.1 %
Eosinophils Absolute: 214 cells/uL (ref 15–500)
Eosinophils Relative: 3.2 %
HCT: 46.3 % — ABNORMAL HIGH (ref 35.0–45.0)
Hemoglobin: 15.6 g/dL — ABNORMAL HIGH (ref 11.7–15.5)
Lymphs Abs: 1501 cells/uL (ref 850–3900)
MCH: 31 pg (ref 27.0–33.0)
MCHC: 33.7 g/dL (ref 32.0–36.0)
MCV: 91.9 fL (ref 80.0–100.0)
MPV: 11 fL (ref 7.5–12.5)
Monocytes Relative: 9.2 %
Neutro Abs: 4295 cells/uL (ref 1500–7800)
Neutrophils Relative %: 64.1 %
Platelets: 247 10*3/uL (ref 140–400)
RBC: 5.04 10*6/uL (ref 3.80–5.10)
RDW: 13.4 % (ref 11.0–15.0)
Total Lymphocyte: 22.4 %
WBC: 6.7 10*3/uL (ref 3.8–10.8)

## 2021-08-10 LAB — LIPID PANEL
Cholesterol: 194 mg/dL (ref <200)
HDL: 50 mg/dL (ref >=50)
LDL Cholesterol (Calc): 118 mg/dL (calc) — ABNORMAL HIGH
Non-HDL Cholesterol (Calc): 144 mg/dL (calc) — ABNORMAL HIGH (ref <130)
Total CHOL/HDL Ratio: 3.9 (calc) (ref <5.0)
Triglycerides: 149 mg/dL (ref <150)

## 2021-08-10 LAB — MICROALBUMIN / CREATININE URINE RATIO
Creatinine, Urine: 14 mg/dL — ABNORMAL LOW (ref 20–275)
Microalb, Ur: <0.2 mg/dL

## 2021-08-10 LAB — VITAMIN D 25 HYDROXY (VIT D DEFICIENCY, FRACTURES): Vit D, 25-Hydroxy: 81 ng/mL (ref 30–100)

## 2021-08-10 LAB — HEMOGLOBIN A1C
Hgb A1c MFr Bld: 5.7 % of total Hgb — ABNORMAL HIGH (ref <5.7)
Mean Plasma Glucose: 117 mg/dL
eAG (mmol/L): 6.5 mmol/L

## 2021-08-10 LAB — MAGNESIUM: Magnesium: 2.2 mg/dL (ref 1.5–2.5)

## 2021-08-10 LAB — TSH: TSH: 1.03 mIU/L (ref 0.40–4.50)

## 2021-08-13 LAB — PAP, TP IMAGING W/ HPV RNA, RFLX HPV TYPE 16,18/45: HPV DNA High Risk: NOT DETECTED

## 2021-08-13 LAB — PAP, TP IMAGING, WNL RFLX HPV

## 2021-09-03 ENCOUNTER — Other Ambulatory Visit: Payer: Self-pay

## 2021-09-03 DIAGNOSIS — E079 Disorder of thyroid, unspecified: Secondary | ICD-10-CM

## 2021-09-03 MED ORDER — LEVOTHYROXINE SODIUM 150 MCG PO TABS: ORAL_TABLET | ORAL | 0 refills | Status: DC

## 2021-09-03 NOTE — Telephone Encounter (Signed)
Refill request for Levothyroxine.  ?Pill Pack by [employer] [facility] ?

## 2021-09-06 ENCOUNTER — Other Ambulatory Visit: Payer: Self-pay | Admitting: Internal Medicine

## 2021-09-06 DIAGNOSIS — Z1231 Encounter for screening mammogram for malignant neoplasm of breast: Secondary | ICD-10-CM

## 2021-09-19 ENCOUNTER — Encounter: Payer: Self-pay | Admitting: Nurse Practitioner

## 2021-09-19 ENCOUNTER — Other Ambulatory Visit: Payer: Self-pay | Admitting: Nurse Practitioner

## 2021-09-19 DIAGNOSIS — M79671 Pain in right foot: Secondary | ICD-10-CM

## 2021-09-19 DIAGNOSIS — M2141 Flat foot [pes planus] (acquired), right foot: Secondary | ICD-10-CM

## 2021-10-02 ENCOUNTER — Ambulatory Visit (INDEPENDENT_AMBULATORY_CARE_PROVIDER_SITE_OTHER): Payer: 59

## 2021-10-02 ENCOUNTER — Ambulatory Visit: Payer: 59 | Admitting: Podiatry

## 2021-10-02 DIAGNOSIS — M2141 Flat foot [pes planus] (acquired), right foot: Secondary | ICD-10-CM

## 2021-10-02 DIAGNOSIS — M2142 Flat foot [pes planus] (acquired), left foot: Secondary | ICD-10-CM

## 2021-10-02 DIAGNOSIS — G629 Polyneuropathy, unspecified: Secondary | ICD-10-CM | POA: Diagnosis not present

## 2021-10-02 DIAGNOSIS — M779 Enthesopathy, unspecified: Secondary | ICD-10-CM | POA: Diagnosis not present

## 2021-10-02 NOTE — Progress Notes (Signed)
?Subjective:  ?Patient ID: Jenny Arroyo, female    DOB: 24-Jan-1961,  MRN: [id number] ?HPI ?Chief Complaint  ?Patient presents with  ? New Patient (Initial Visit)  ? Flat Foot  ?  Pt reports she has always had flat feet mentioned she its now become painful constant pains- developing numbness and tingling feelings   ? ? ?61 y.o. female presents with the above complaint.  ? ?ROS: Denies fever chills nausea vomiting muscle aches pains calf pain back pain chest pain shortness of breath. ? ?Past Medical History:  ?Diagnosis Date  ? Chronic kidney disease   ? kidney stones   ? Depression   ? mild on Bupropion   ? Hypertension   ? PONV (postoperative nausea and vomiting)   ? Thyroid disease   ? ?Past Surgical History:  ?Procedure Laterality Date  ? APPENDECTOMY    ? CESAREAN SECTION    ? x1  ? CHOLECYSTECTOMY    ? ECTOPIC PREGNANCY SURGERY    ? KNEE ARTHROSCOPY Right 2015  ? LITHOTRIPSY    ? REPLACEMENT TOTAL KNEE Right 04/2018  ? TONSILLECTOMY    ? ? ?Current Outpatient Medications:  ?  Ascorbic Acid (VITAMIN C) 1000 MG tablet, Take 1,000 mg by mouth daily., Disp: , Rfl:  ?  aspirin 81 MG chewable tablet, Chew by mouth daily., Disp: , Rfl:  ?  buPROPion (WELLBUTRIN XL) 300 MG 24 hr tablet, Take 1 tablet (300 mg total) by mouth every morning., Disp: 90 tablet, Rfl: 2 ?  Cholecalciferol (VITAMIN D PO), Take 5,000 Int'l Units by mouth daily., Disp: , Rfl:  ?  diazepam (VALIUM) 5 MG tablet, 1/2-1 pill every 8 hours for dizziness/vertigo (Patient taking differently: 1/2-1 pill every 8 hours for dizziness/vertigo  PRN), Disp: 30 tablet, Rfl: 0 ?  levothyroxine (SYNTHROID) 150 MCG tablet, Take 1/2 tablet  on Tuesday and Thursday  &  take 1 tablet  on all other days - Take  on an empty stomach with only water for 30 minutes & no Antacid meds, Calcium or Magnesium for 4 hours & avoid Biotin, Disp: 90 tablet, Rfl: 0 ?  olmesartan (BENICAR) 20 MG tablet, Take  1 tablet  Daily  for BP                                                            /                             TAKE                BY MOUTH, Disp: 90 tablet, Rfl: 3 ?  Omega-3 Fatty Acids (FISH OIL) 1200 MG CPDR, Take by mouth., Disp: , Rfl:  ?  Zinc 50 MG CAPS, Take by mouth., Disp: , Rfl:  ? ?Allergies  ?Allergen Reactions  ? Augmentin [Amoxicillin-Pot Clavulanate] Nausea And Vomiting  ? ?Review of Systems ?Objective:  ?There were no vitals filed for this visit. ? ?General: Well developed, nourished, in no acute distress, alert and oriented x3  ? ?Dermatological: Skin is warm, dry and supple bilateral. Nails x 10 are well maintained; remaining integument appears unremarkable at this time. There are no open sores, no preulcerative lesions, no rash or signs of infection present. ? ?Vascular:  Dorsalis Pedis artery and Posterior Tibial artery pedal pulses are 2/4 bilateral with immedate capillary fill time. Pedal hair growth present. No varicosities and no lower extremity edema present bilateral.  ? ?Neruologic: Grossly intact via light touch bilateral. Vibratory intact via tuning fork bilateral. Protective threshold with Semmes Wienstein monofilament intact to all pedal sites bilateral. Patellar and Achilles deep tendon reflexes 2+ bilateral. No Babinski or clonus noted bilateral.  ? ?Musculoskeletal: No gross boney pedal deformities bilateral. No pain, crepitus, or limitation noted with foot and ankle range of motion bilateral. Muscular strength 5/5 in all groups tested bilateral.  She has some burning and numbness in the forefoot bilaterally extending into the toes.  Nothing reproducible on palpation other than mild pes planovalgus and some forefoot capsulitis. ? ?Gait: Unassisted, Nonantalgic.  ? ? ?Radiographs: ? ?Radiographs taken today demonstrate osseously mature individual some early osteoarthritic changes of the midfoot otherwise mild pes planus is noted no acute findings are identified. ? ?Assessment & Plan:  ? ?Assessment: We question whether or not there is actual  neuropathy or if is more of a neuralgia associated with capsulitis due to her pes planus. ? ?Plan: At this point were going to set her up with [facility]elman for orthotics.  Should the neuropathic symptoms remain unchanged then we will consider neurology for neuropathy or consider medication. ? ? ? ? ?Yahmir Sokolov T. [place], DPM ?

## 2021-10-15 ENCOUNTER — Ambulatory Visit (INDEPENDENT_AMBULATORY_CARE_PROVIDER_SITE_OTHER): Payer: 59

## 2021-10-15 DIAGNOSIS — M2141 Flat foot [pes planus] (acquired), right foot: Secondary | ICD-10-CM

## 2021-10-15 DIAGNOSIS — M2142 Flat foot [pes planus] (acquired), left foot: Secondary | ICD-10-CM | POA: Diagnosis not present

## 2021-10-15 DIAGNOSIS — M779 Enthesopathy, unspecified: Secondary | ICD-10-CM

## 2021-10-15 NOTE — Progress Notes (Signed)
SITUATION ?Reason for Consult: Evaluation for Bilateral Custom Foot Orthoses ?Patient / Caregiver Report: Patient is ready for foot orthotics ? ?OBJECTIVE DATA: ?Patient History / Diagnosis:  ?  ICD-10-CM   ?1. Pes planus of both feet  M21.41   ? M21.42   ?  ?2. Capsulitis  M77.9   ?  ? ? ?Current or Previous Devices:   None ? ?Foot Examination: ?Skin presentation:   Intact ?Ulcers & Callousing:   None ?Toe / Foot Deformities:  Pes planus ?Weight Bearing Presentation:  Planus ?Sensation:    Intact ? ?Shoe Size:    8.5W ? ?ORTHOTIC RECOMMENDATION ?Recommended Device: 1x pair of custom functional foot orthotics ? ?GOALS OF ORTHOSES ?- Reduce Pain ?- Prevent Foot Deformity ?- Prevent Progression of Further Foot Deformity ?- Relieve Pressure ?- Improve the Overall Biomechanical Function of the Foot and Lower Extremity. ? ?ACTIONS PERFORMED ?Potential out of pocket cost was communicated to patient. Patient understood and consent to casting. Patient was casted for Foot Orthoses via crush box. Procedure was explained and patient tolerated procedure well. Casts were shipped to central fabrication. All questions were answered and concerns addressed. ? ?PLAN ?Patient is to be called for fitting when devices are ready.  ? ? ?

## 2021-10-30 ENCOUNTER — Ambulatory Visit
Admission: RE | Admit: 2021-10-30 | Discharge: 2021-10-30 | Disposition: A | Discharge status: Home / Self Care | Payer: 59 | Source: Ambulatory Visit | Attending: Internal Medicine | Admitting: Internal Medicine

## 2021-10-30 DIAGNOSIS — Z1231 Encounter for screening mammogram for malignant neoplasm of breast: Secondary | ICD-10-CM | POA: Diagnosis not present

## 2021-11-01 ENCOUNTER — Telehealth: Payer: Self-pay

## 2021-11-01 NOTE — Telephone Encounter (Signed)
Foot Orthotics Ready - Left VM to schedule appointment

## 2021-11-06 ENCOUNTER — Ambulatory Visit: Payer: 59

## 2021-11-06 DIAGNOSIS — M779 Enthesopathy, unspecified: Secondary | ICD-10-CM

## 2021-11-06 DIAGNOSIS — M2142 Flat foot [pes planus] (acquired), left foot: Secondary | ICD-10-CM

## 2021-11-06 NOTE — Progress Notes (Signed)
SITUATION: Reason for Visit: Fitting and Delivery of Custom Fabricated Foot Orthoses Patient Report: Patient reports comfort and is satisfied with device.  OBJECTIVE DATA: Patient History / Diagnosis:     ICD-10-CM   1. Pes planus of both feet  M21.41    M21.42     2. Capsulitis  M77.9       Provided Device:  Custom Functional Foot Orthotics     [facility]: [id number]  GOAL OF ORTHOSIS - Improve gait - Decrease energy expenditure - Improve Balance - Provide Triplanar stability of foot complex - Facilitate motion  ACTIONS PERFORMED Patient was fit with foot orthotics trimmed to shoe last. Patient tolerated fittign procedure.   Patient was provided with verbal and written instruction and demonstration regarding donning, doffing, wear, care, proper fit, function, purpose, cleaning, and use of the orthosis and in all related precautions and risks and benefits regarding the orthosis.  Patient was also provided with verbal instruction regarding how to report any failures or malfunctions of the orthosis and necessary follow up care. Patient was also instructed to contact our office regarding any change in status that may affect the function of the orthosis.  Patient demonstrated independence with proper donning, doffing, and fit and verbalized understanding of all instructions.  PLAN: Patient is to follow up in one week or as necessary (PRN). All questions were answered and concerns addressed. Plan of care was discussed with and agreed upon by the patient.

## 2021-11-26 ENCOUNTER — Ambulatory Visit: Payer: 59

## 2021-12-14 ENCOUNTER — Other Ambulatory Visit: Payer: Self-pay | Admitting: Nurse Practitioner

## 2021-12-14 ENCOUNTER — Encounter: Payer: Self-pay | Admitting: Nurse Practitioner

## 2021-12-14 DIAGNOSIS — L219 Seborrheic dermatitis, unspecified: Secondary | ICD-10-CM

## 2021-12-14 MED ORDER — TRIAMCINOLONE ACETONIDE 0.1 % EX OINT: 1.0000 | TOPICAL_OINTMENT | Freq: Two times a day (BID) | CUTANEOUS | 1 refills | Status: DC

## 2021-12-14 NOTE — Progress Notes (Signed)
Script sent in for seborrheic dermatitis.  If no improvement she is to c=schedule an appointment

## 2021-12-25 ENCOUNTER — Ambulatory Visit: Payer: 59 | Admitting: Nurse Practitioner

## 2021-12-25 ENCOUNTER — Encounter: Payer: Self-pay | Admitting: Nurse Practitioner

## 2021-12-25 VITALS — BP 112/82 | HR 79 | Temp 97.7°F | Ht 67.5 in | Wt 281.2 lb

## 2021-12-25 DIAGNOSIS — F3341 Major depressive disorder, recurrent, in partial remission: Secondary | ICD-10-CM | POA: Diagnosis not present

## 2021-12-25 DIAGNOSIS — J069 Acute upper respiratory infection, unspecified: Secondary | ICD-10-CM

## 2021-12-25 DIAGNOSIS — R69 Illness, unspecified: Secondary | ICD-10-CM | POA: Diagnosis not present

## 2021-12-25 MED ORDER — BENZONATATE 100 MG PO CAPS: 100.0000 mg | ORAL_CAPSULE | Freq: Four times a day (QID) | ORAL | 1 refills | Status: DC | PRN

## 2021-12-25 MED ORDER — DEXAMETHASONE 1 MG PO TABS: ORAL_TABLET | ORAL | 0 refills | Status: DC

## 2021-12-25 MED ORDER — AZITHROMYCIN 250 MG PO TABS: ORAL_TABLET | ORAL | 1 refills | Status: DC

## 2021-12-25 MED ORDER — BUPROPION HCL ER (XL) 150 MG PO TB24: 150.0000 mg | ORAL_TABLET | ORAL | 2 refills | Status: DC

## 2021-12-25 NOTE — Progress Notes (Signed)
Assessment and Plan: Baljit was seen today for uri.  Diagnoses and all orders for this visit:  Depression, major, recurrent, in partial remission ([place]) She had stopped her Wellbutrin 300 mg in May but is not feeling as much enjoyment in her life- will restart Wellbutrin at lower dose of 150 mg daily and monitor symptoms -     buPROPion (WELLBUTRIN XL) 150 MG 24 hr tablet; Take 1 tablet (150 mg total) by mouth every morning.  URI, acute  Push fluids  Mucinex as needed  If no improvement by Monday notify the office -     azithromycin (ZITHROMAX) 250 MG tablet; Take 2 tablets (500 mg) on  Day 1,  followed by 1 tablet (250 mg) once daily on Days 2 through 5. -     dexamethasone (DECADRON) 1 MG tablet; Take 3 tabs for 3 days, 2 tabs for 3 days 1 tab for 5 days. Take with food. -     benzonatate (TESSALON PERLES) 100 MG capsule; Take 1 capsule (100 mg total) by mouth every 6 (six) hours as needed for cough.       Further disposition pending results of labs. Discussed med's effects and SE's.   Over 30 minutes of exam, counseling, chart review, and critical decision making was performed.   Future Appointments  Date Time Provider [place]  02/25/2022  9:30 AM Alycia Rossetti, NP [facility] None  08/12/2022  9:00 AM Alycia Rossetti, NP [facility] None    ------------------------------------------------------------------------------------------------------------------   HPI BP 112/82   Pulse 79   Temp 97.7 F (36.5 C)   Ht 5' 7.5" (1.715 m)   Wt 281 lb 3.2 oz (127.6 kg)   [name] 10/24/2017   SpO2 97%   BMI 43.39 kg/m    61 y.o.female presents for dry cough associated with left ear fullness/pain, body aches and laryngitis.  Denies fever, nausea and vomiting.Symptoms have been present x 6 days.   She went off Wellbutrin in May- emotional feels fine but has more body pains and not as much joy in her life since stopping the medication.  Is possibly interested in restarting  the medication but at a lower dose.   Past Medical History:  Diagnosis Date   Chronic kidney disease    kidney stones    Depression    mild on Bupropion    Hypertension    PONV (postoperative nausea and vomiting)    Thyroid disease      Allergies  Allergen Reactions   Augmentin [Amoxicillin-Pot Clavulanate] Nausea And Vomiting    Current Outpatient Medications on File Prior to Visit  Medication Sig   aspirin 81 MG chewable tablet Chew by mouth daily.   Cholecalciferol (VITAMIN D PO) Take 5,000 Int'l Units by mouth daily.   diazepam (VALIUM) 5 MG tablet 1/2-1 pill every 8 hours for dizziness/vertigo (Patient taking differently: 1/2-1 pill every 8 hours for dizziness/vertigo  PRN)   levothyroxine (SYNTHROID) 150 MCG tablet Take 1/2 tablet  on Tuesday and Thursday  &  take 1 tablet  on all other days - Take  on an empty stomach with only water for 30 minutes & no Antacid meds, Calcium or Magnesium for 4 hours & avoid Biotin   olmesartan (BENICAR) 20 MG tablet Take  1 tablet  Daily  for BP                                                           /  TAKE                BY MOUTH   Omega-3 Fatty Acids (FISH OIL) 1200 MG CPDR Take by mouth.   triamcinolone ointment (KENALOG) 0.1 % Apply 1 Application topically 2 (two) times daily.   Ascorbic Acid (VITAMIN C) 1000 MG tablet Take 1,000 mg by mouth daily. (Patient not taking: Reported on 12/25/2021)   buPROPion (WELLBUTRIN XL) 300 MG 24 hr tablet Take 1 tablet (300 mg total) by mouth every morning. (Patient not taking: Reported on 12/25/2021)   Zinc 50 MG CAPS Take by mouth. (Patient not taking: Reported on 12/25/2021)   No current facility-administered medications on file prior to visit.    ROS: all negative except above.   Physical Exam:  BP 112/82   Pulse 79   Temp 97.7 F (36.5 C)   Ht 5' 7.5" (1.715 m)   Wt 281 lb 3.2 oz (127.6 kg)   [name] 10/24/2017   SpO2 97%   BMI 43.39 kg/m   General Appearance:  Well nourished, in no apparent distress. Eyes: PERRLA, EOMs, conjunctiva no swelling or erythema Sinuses: No Frontal/maxillary tenderness ENT/Mouth: Ext aud canals clear, L TM erythematous R TM no erythema or bulging. No erythema, swelling, or exudate on post pharynx.  Tonsils not swollen or erythematous. Hearing normal.  Neck: Supple, thyroid normal.  Respiratory: Respiratory effort normal, BS equal bilaterally without rales, rhonchi, wheezing or stridor.  Cardio: RRR with no MRGs. Brisk peripheral pulses without edema.  Abdomen: Soft, + BS.  Non tender, no guarding, rebound, hernias, masses. Lymphatics: + Left submandibular adenopathy Musculoskeletal: Full ROM, 5/5 strength, normal gait.  Skin: Warm, dry without rashes, lesions, ecchymosis.  Neuro: Cranial nerves intact. Normal muscle tone, no cerebellar symptoms. Sensation intact.  Psych: Awake and oriented X 3, normal affect, Insight and Judgment appropriate.     Alycia Rossetti, NP 12:05 PM [facility]

## 2021-12-30 ENCOUNTER — Other Ambulatory Visit: Payer: Self-pay | Admitting: Nurse Practitioner

## 2021-12-30 DIAGNOSIS — E079 Disorder of thyroid, unspecified: Secondary | ICD-10-CM

## 2022-01-10 ENCOUNTER — Encounter: Payer: Self-pay | Admitting: Nurse Practitioner

## 2022-01-10 ENCOUNTER — Other Ambulatory Visit: Payer: Self-pay | Admitting: Nurse Practitioner

## 2022-01-10 DIAGNOSIS — E079 Disorder of thyroid, unspecified: Secondary | ICD-10-CM

## 2022-01-10 MED ORDER — LEVOTHYROXINE SODIUM 150 MCG PO TABS: ORAL_TABLET | ORAL | 2 refills | Status: DC

## 2022-01-23 DIAGNOSIS — M25562 Pain in left knee: Secondary | ICD-10-CM | POA: Diagnosis not present

## 2022-02-14 ENCOUNTER — Ambulatory Visit: Payer: 59 | Admitting: Nurse Practitioner

## 2022-02-14 ENCOUNTER — Encounter: Payer: Self-pay | Admitting: Nurse Practitioner

## 2022-02-14 ENCOUNTER — Encounter: Payer: Self-pay | Admitting: Emergency Medicine

## 2022-02-14 ENCOUNTER — Ambulatory Visit
Admission: EM | Admit: 2022-02-14 | Discharge: 2022-02-14 | Disposition: A | Discharge status: Home / Self Care | Payer: 59 | Attending: Urgent Care | Admitting: Urgent Care

## 2022-02-14 DIAGNOSIS — H109 Unspecified conjunctivitis: Secondary | ICD-10-CM | POA: Diagnosis not present

## 2022-02-14 MED ORDER — TOBRAMYCIN 0.3 % OP SOLN: 1.0000 [drp] | OPHTHALMIC | 0 refills | Status: DC

## 2022-02-14 NOTE — ED Triage Notes (Signed)
Pt here with left eye redness, drainage and irritation x 2 days.

## 2022-02-14 NOTE — Progress Notes (Deleted)
Assessment and Plan:  There are no diagnoses linked to this encounter.    Further disposition pending results of labs. Discussed med's effects and SE's.   Over 30 minutes of exam, counseling, chart review, and critical decision making was performed.   Future Appointments  Date Time Provider [place]  02/14/2022  3:45 PM Alycia Rossetti, NP [facility] None  03/12/2022  2:30 PM Alycia Rossetti, NP [facility] None  08/12/2022  9:00 AM Alycia Rossetti, NP [facility] None    ------------------------------------------------------------------------------------------------------------------   HPI [name] 10/24/2017  61 y.o.female presents for  Past Medical History:  Diagnosis Date   Chronic kidney disease    kidney stones    Depression    mild on Bupropion    Hypertension    PONV (postoperative nausea and vomiting)    Thyroid disease      Allergies  Allergen Reactions   Augmentin [Amoxicillin-Pot Clavulanate] Nausea And Vomiting    Current Outpatient Medications on File Prior to Visit  Medication Sig   Ascorbic Acid (VITAMIN C) 1000 MG tablet Take 1,000 mg by mouth daily. (Patient not taking: Reported on 12/25/2021)   aspirin 81 MG chewable tablet Chew by mouth daily.   azithromycin (ZITHROMAX) 250 MG tablet Take 2 tablets (500 mg) on  Day 1,  followed by 1 tablet (250 mg) once daily on Days 2 through 5.   benzonatate (TESSALON PERLES) 100 MG capsule Take 1 capsule (100 mg total) by mouth every 6 (six) hours as needed for cough.   buPROPion (WELLBUTRIN XL) 150 MG 24 hr tablet Take 1 tablet (150 mg total) by mouth every morning.   Cholecalciferol (VITAMIN D PO) Take 5,000 Int'l Units by mouth daily.   dexamethasone (DECADRON) 1 MG tablet Take 3 tabs for 3 days, 2 tabs for 3 days 1 tab for 5 days. Take with food.   diazepam (VALIUM) 5 MG tablet 1/2-1 pill every 8 hours for dizziness/vertigo (Patient taking differently: 1/2-1 pill every 8 hours for dizziness/vertigo  PRN)    levothyroxine (SYNTHROID) 150 MCG tablet Take 1/2 tablet by mouth daily on Tuesday and Thursday, then, take 1 tablet by mouth daily on all other days. Take on an empty stomach with only water for 30 minutes, no antacid, calcium or magnesium medications for 4 hours and avoid biotin.   olmesartan (BENICAR) 20 MG tablet Take  1 tablet  Daily  for BP                                                           /                             TAKE                BY MOUTH   Omega-3 Fatty Acids (FISH OIL) 1200 MG CPDR Take by mouth.   triamcinolone ointment (KENALOG) 0.1 % Apply 1 Application topically 2 (two) times daily.   Zinc 50 MG CAPS Take by mouth. (Patient not taking: Reported on 12/25/2021)   No current facility-administered medications on file prior to visit.    ROS: all negative except above.   Physical Exam:  [name] 10/24/2017   General Appearance: Well nourished, in no apparent distress. Eyes: PERRLA, EOMs,  conjunctiva no swelling or erythema Sinuses: No Frontal/maxillary tenderness ENT/Mouth: Ext aud canals clear, TMs without erythema, bulging. No erythema, swelling, or exudate on post pharynx.  Tonsils not swollen or erythematous. Hearing normal.  Neck: Supple, thyroid normal.  Respiratory: Respiratory effort normal, BS equal bilaterally without rales, rhonchi, wheezing or stridor.  Cardio: RRR with no MRGs. Brisk peripheral pulses without edema.  Abdomen: Soft, + BS.  Non tender, no guarding, rebound, hernias, masses. Lymphatics: Non tender without lymphadenopathy.  Musculoskeletal: Full ROM, 5/5 strength, normal gait.  Skin: Warm, dry without rashes, lesions, ecchymosis.  Neuro: Cranial nerves intact. Normal muscle tone, no cerebellar symptoms. Sensation intact.  Psych: Awake and oriented X 3, normal affect, Insight and Judgment appropriate.     Alycia Rossetti, NP 8:55 AM [facility]

## 2022-02-14 NOTE — ED Provider Notes (Signed)
[facility]  Note:  This document was prepared using Systems analyst and may include unintentional dictation errors.  MRN: [id number] DOB: Jan 28, 1961  Subjective:   Jenny Arroyo is a 61 y.o. female presenting for 2-day history of acute onset persistent left eye redness, drainage and irritation.  No vision changes, photophobia, overt eye pain, double vision, tunnel vision.  No history of glaucoma.  No sick contacts to her knowledge.  Does not wear contact lenses.  No current facility-administered medications for this encounter.  Current Outpatient Medications:    Ascorbic Acid (VITAMIN C) 1000 MG tablet, Take 1,000 mg by mouth daily. (Patient not taking: Reported on 12/25/2021), Disp: , Rfl:    aspirin 81 MG chewable tablet, Chew by mouth daily., Disp: , Rfl:    azithromycin (ZITHROMAX) 250 MG tablet, Take 2 tablets (500 mg) on  Day 1,  followed by 1 tablet (250 mg) once daily on Days 2 through 5., Disp: 6 each, Rfl: 1   benzonatate (TESSALON PERLES) 100 MG capsule, Take 1 capsule (100 mg total) by mouth every 6 (six) hours as needed for cough., Disp: 30 capsule, Rfl: 1   buPROPion (WELLBUTRIN XL) 150 MG 24 hr tablet, Take 1 tablet (150 mg total) by mouth every morning., Disp: 30 tablet, Rfl: 2   Cholecalciferol (VITAMIN D PO), Take 5,000 Int'l Units by mouth daily., Disp: , Rfl:    dexamethasone (DECADRON) 1 MG tablet, Take 3 tabs for 3 days, 2 tabs for 3 days 1 tab for 5 days. Take with food., Disp: 20 tablet, Rfl: 0   diazepam (VALIUM) 5 MG tablet, 1/2-1 pill every 8 hours for dizziness/vertigo (Patient taking differently: 1/2-1 pill every 8 hours for dizziness/vertigo  PRN), Disp: 30 tablet, Rfl: 0   levothyroxine (SYNTHROID) 150 MCG tablet, Take 1/2 tablet by mouth daily on Tuesday and Thursday, then, take 1 tablet by mouth daily on all other days. Take on an empty stomach with only water for 30 minutes, no antacid, calcium or magnesium  medications for 4 hours and avoid biotin., Disp: 90 tablet, Rfl: 2   olmesartan (BENICAR) 20 MG tablet, Take  1 tablet  Daily  for BP                                                           /                             TAKE                BY MOUTH, Disp: 90 tablet, Rfl: 3   Omega-3 Fatty Acids (FISH OIL) 1200 MG CPDR, Take by mouth., Disp: , Rfl:    triamcinolone ointment (KENALOG) 0.1 %, Apply 1 Application topically 2 (two) times daily., Disp: 80 g, Rfl: 1   Zinc 50 MG CAPS, Take by mouth. (Patient not taking: Reported on 12/25/2021), Disp: , Rfl:    Allergies  Allergen Reactions   Augmentin [Amoxicillin-Pot Clavulanate] Nausea And Vomiting    Past Medical History:  Diagnosis Date   Chronic kidney disease    kidney stones    Depression    mild on Bupropion    Hypertension    PONV (postoperative nausea and vomiting)  Thyroid disease      Past Surgical History:  Procedure Laterality Date   APPENDECTOMY     CESAREAN SECTION     x1   CHOLECYSTECTOMY     ECTOPIC PREGNANCY SURGERY     KNEE ARTHROSCOPY Right 2015   LITHOTRIPSY     REPLACEMENT TOTAL KNEE Right 04/2018   TONSILLECTOMY      Family History  Problem Relation Age of Onset   Hypertension Mother    Alzheimer's disease Mother 10   Cancer Father 73       colon    Hypertension Father    Arthritis Father    Gout Father    Colon cancer Father 80   Pancreatic cancer Father    Depression Brother    Mental illness Daughter    Alzheimer's disease Maternal Grandfather 32   Colon polyps Neg Hx    Rectal cancer Neg Hx    Stomach cancer Neg Hx    Esophageal cancer Neg Hx     Social History   Tobacco Use   Smoking status: Never   Smokeless tobacco: Never  Vaping Use   Vaping Use: Never used  Substance Use Topics   Alcohol use: No   Drug use: No    ROS   Objective:   Vitals: BP (!) 161/89   Pulse 72   Temp 99 F (37.2 C)   Resp 18   [name] 10/24/2017   SpO2 98%   Physical Exam Constitutional:       General: She is not in acute distress.    Appearance: Normal appearance. She is well-developed. She is not ill-appearing, toxic-appearing or diaphoretic.  HENT:     Head: Normocephalic and atraumatic.     Nose: Nose normal.     Mouth/Throat:     Mouth: Mucous membranes are moist.  Eyes:     General: Lids are normal. Lids are everted, no foreign bodies appreciated. Vision grossly intact. No scleral icterus.       Right eye: No foreign body, discharge or hordeolum.        Left eye: No foreign body, discharge or hordeolum.     Extraocular Movements: Extraocular movements intact.     Right eye: Normal extraocular motion.     Left eye: Normal extraocular motion and no nystagmus.     Conjunctiva/sclera:     Right eye: Right conjunctiva is not injected. No chemosis, exudate or hemorrhage.    Left eye: Left conjunctiva is injected. No chemosis, exudate or hemorrhage. Cardiovascular:     Rate and Rhythm: Normal rate.  Pulmonary:     Effort: Pulmonary effort is normal.  Skin:    General: Skin is warm and dry.  Neurological:     General: No focal deficit present.     Mental Status: She is alert and oriented to person, place, and time.  Psychiatric:        Mood and Affect: Mood normal.        Behavior: Behavior normal.     Assessment and Plan :   PDMP not reviewed this encounter.  1. Bacterial conjunctivitis of left eye    Will start tobramycin to address bacterial conjunctivitis of left eye.  Low suspicion for an acute ophthalmologic emergency.  Counseled patient on potential for adverse effects with medications prescribed/recommended today, ER and return-to-clinic precautions discussed, patient verbalized understanding.    Jaynee Eagles, [place] 02/14/22 1111

## 2022-02-25 ENCOUNTER — Ambulatory Visit: Payer: 59 | Admitting: Nurse Practitioner

## 2022-02-26 ENCOUNTER — Encounter: Payer: Self-pay | Admitting: Internal Medicine

## 2022-03-11 NOTE — Progress Notes (Signed)
FOLLOW UP  Assessment and Plan:   Hypertension Bp running 110's/70's.  Will cut Olmesartan to 1/2 tab daily Monitor blood pressure at home; patient to call if consistently greater than 130/80 Continue DASH diet.   Reminder to go to the ER if any CP, SOB, nausea, dizziness, severe HA, changes vision/speech, left arm numbness and tingling and jaw pain. CBC CMP  Cholesterol Currently not at goal;  Continue low cholesterol diet and exercise.  Check lipid panel.   Abnormal glucose Continue diet and exercise.  Check A1C  Obesity with co morbidities Long discussion about weight loss, diet, and exercise Has lost 20 pounds in the past 3 months doing intermittent fasting- doing very well Recommended diet heavy in fruits and veggies and low in animal meats, cheeses, and dairy products, appropriate calorie intake Pt plans to resume water fitness classes at [employer]  Depression Doing well on Wellbutrin, symptoms well controlled  Thyroid Disease TSH Continue Synthroid dosage and will adjust accordingly pending labs  Vitamin D Def At goal at last visit; continue supplementation to maintain goal of 60-100   Medication Management Continued  Continue diet and meds as discussed. Further disposition pending results of labs. Discussed med's effects and SE's.   Over 30 minutes of exam, counseling, chart review, and critical decision making was performed.   Future Appointments  Date Time Provider [place]  08/12/2022  9:00 AM Alycia Rossetti, NP [facility] None    ----------------------------------------------------------------------------------------------------------------------  HPI 61 y.o. female  presents for 6 month follow up on hypertension, cholesterol, diabetes, weight and vitamin D deficiency.   BMI is Body mass index is 40.4 kg/m., she has been working on diet and exercise. Has been heavy her entire life, even as a child. Walks approx 30 minutes a day with  dogs.She has lost 20 pounds in the past 3 months. She is doing intermittent fasting and limiting carbs. She eats 11 am- 6 pm. Sometimes will do a 24-36 hour fast.  Wt Readings from Last 3 Encounters:  03/12/22 261 lb 12.8 oz (118.8 kg)  12/25/21 281 lb 3.2 oz (127.6 kg)  08/09/21 277 lb 12.8 oz (126 kg)   She does have left knee pain and has been evaluated by orthopedics. Does say they do need to do a knee replacement until she loses more weight. Would like her weight around 255  Her blood pressure has been controlled at home, Bp's running 110's/70's today their BP is BP: 112/78 BP Readings from Last 3 Encounters:  03/12/22 112/78  02/14/22 (!) 161/89  12/25/21 112/82     She does workout. She denies chest pain, shortness of breath, dizziness.   She is on cholesterol medication . She takes Omega 3 fish oil.  Her cholesterol is not at goal. The cholesterol last visit was:   Lab Results  Component Value Date   CHOL 194 08/09/2021   HDL 50 08/09/2021   LDLCALC 118 (H) 08/09/2021   TRIG 149 08/09/2021   CHOLHDL 3.9 08/09/2021    She has been working on diet and exercise for prediabetes,  Last A1C in the office was:  Lab Results  Component Value Date   HGBA1C 5.7 (H) 08/09/2021   Patient is on Vitamin D supplement.   Lab Results  Component Value Date   VD25OH 48 08/09/2021        Current Medications:  Current Outpatient Medications on File Prior to Visit  Medication Sig   aspirin 81 MG chewable tablet Chew by mouth daily.  buPROPion (WELLBUTRIN XL) 150 MG 24 hr tablet Take 1 tablet (150 mg total) by mouth every morning.   Cholecalciferol (VITAMIN D PO) Take 5,000 Int'l Units by mouth daily.   diazepam (VALIUM) 5 MG tablet 1/2-1 pill every 8 hours for dizziness/vertigo (Patient taking differently: 1/2-1 pill every 8 hours for dizziness/vertigo  PRN)   levothyroxine (SYNTHROID) 150 MCG tablet Take 1/2 tablet by mouth daily on Tuesday and Thursday, then, take 1 tablet by mouth  daily on all other days. Take on an empty stomach with only water for 30 minutes, no antacid, calcium or magnesium medications for 4 hours and avoid biotin.   olmesartan (BENICAR) 20 MG tablet Take  1 tablet  Daily  for BP                                                           /                             TAKE                BY MOUTH   Omega-3 Fatty Acids (FISH OIL) 1200 MG CPDR Take by mouth.   triamcinolone ointment (KENALOG) 0.1 % Apply 1 Application topically 2 (two) times daily.   Ascorbic Acid (VITAMIN C) 1000 MG tablet Take 1,000 mg by mouth daily. (Patient not taking: Reported on 12/25/2021)   azithromycin (ZITHROMAX) 250 MG tablet Take 2 tablets (500 mg) on  Day 1,  followed by 1 tablet (250 mg) once daily on Days 2 through 5. (Patient not taking: Reported on 03/12/2022)   benzonatate (TESSALON PERLES) 100 MG capsule Take 1 capsule (100 mg total) by mouth every 6 (six) hours as needed for cough. (Patient not taking: Reported on 03/12/2022)   dexamethasone (DECADRON) 1 MG tablet Take 3 tabs for 3 days, 2 tabs for 3 days 1 tab for 5 days. Take with food. (Patient not taking: Reported on 03/12/2022)   tobramycin (TOBREX) 0.3 % ophthalmic solution Place 1 drop into the left eye every 4 (four) hours. (Patient not taking: Reported on 03/12/2022)   Zinc 50 MG CAPS Take by mouth. (Patient not taking: Reported on 12/25/2021)   No current facility-administered medications on file prior to visit.     Allergies:  Allergies  Allergen Reactions   Augmentin [Amoxicillin-Pot Clavulanate] Nausea And Vomiting     Medical History:  Past Medical History:  Diagnosis Date   Chronic kidney disease    kidney stones    Depression    mild on Bupropion    Hypertension    PONV (postoperative nausea and vomiting)    Thyroid disease    Family history- Reviewed and unchanged Social history- Reviewed and unchanged   Review of Systems:  Review of Systems  Constitutional:  Negative for chills and fever.   HENT:  Positive for tinnitus. Negative for congestion, hearing loss, sinus pain and sore throat.   Eyes:  Negative for blurred vision and double vision.  Respiratory:  Negative for cough, hemoptysis, sputum production, shortness of breath and wheezing.   Cardiovascular:  Negative for chest pain, palpitations and leg swelling.  Gastrointestinal:  Negative for abdominal pain, constipation, diarrhea, heartburn, nausea and vomiting.  Genitourinary:  Negative for dysuria and  urgency.  Musculoskeletal:  Positive for joint pain (left knee). Negative for back pain, falls, myalgias and neck pain.  Skin:  Negative for rash.  Neurological:  Positive for dizziness (vertigo intermittent) and sensory change (decreased in feet bilaterally). Negative for tremors, weakness and headaches.  Endo/Heme/Allergies:  Does not bruise/bleed easily.  Psychiatric/Behavioral:  Negative for depression and suicidal ideas. The patient has insomnia (Wakes up throughput night). The patient is not nervous/anxious.       Physical Exam: BP 112/78   Pulse 70   Temp 97.7 F (36.5 C)   Ht 5' 7.5" (1.715 m)   Wt 261 lb 12.8 oz (118.8 kg)   [name] 10/24/2017   SpO2 97%   BMI 40.40 kg/m  Wt Readings from Last 3 Encounters:  03/12/22 261 lb 12.8 oz (118.8 kg)  12/25/21 281 lb 3.2 oz (127.6 kg)  08/09/21 277 lb 12.8 oz (126 kg)   General Appearance: Well nourished, in no apparent distress. Eyes: PERRLA, EOMs, conjunctiva no swelling or erythema Sinuses: No Frontal/maxillary tenderness ENT/Mouth: Ext aud canals clear, TMs without erythema, bulging. No erythema, swelling, or exudate on post pharynx.  Tonsils not swollen or erythematous. Hearing normal.  Neck: Supple, thyroid normal.  Respiratory: Respiratory effort normal, BS equal bilaterally without rales, rhonchi, wheezing or stridor.  Cardio: RRR with no MRGs. Brisk peripheral pulses without edema.  Abdomen: Soft, + BS.  Non tender, no guarding, rebound, hernias,  masses. Lymphatics: Non tender without lymphadenopathy.  Musculoskeletal: Full ROM, 5/5 strength, Normal gait Skin: Warm, dry without rashes, lesions, ecchymosis.  Neuro: Cranial nerves intact. No cerebellar symptoms. Sensation intact. Psych: Awake and oriented X 3, normal affect, Insight and Judgment appropriate.    Alycia Rossetti, NP 2:40 PM [facility],

## 2022-03-12 ENCOUNTER — Encounter: Payer: Self-pay | Admitting: Nurse Practitioner

## 2022-03-12 ENCOUNTER — Ambulatory Visit: Payer: 59 | Admitting: Nurse Practitioner

## 2022-03-12 VITALS — BP 112/78 | HR 70 | Temp 97.7°F | Ht 67.5 in | Wt 261.8 lb

## 2022-03-12 DIAGNOSIS — E559 Vitamin D deficiency, unspecified: Secondary | ICD-10-CM

## 2022-03-12 DIAGNOSIS — Z79899 Other long term (current) drug therapy: Secondary | ICD-10-CM

## 2022-03-12 DIAGNOSIS — E669 Obesity, unspecified: Secondary | ICD-10-CM

## 2022-03-12 DIAGNOSIS — E785 Hyperlipidemia, unspecified: Secondary | ICD-10-CM | POA: Diagnosis not present

## 2022-03-12 DIAGNOSIS — R7309 Other abnormal glucose: Secondary | ICD-10-CM | POA: Diagnosis not present

## 2022-03-12 DIAGNOSIS — E079 Disorder of thyroid, unspecified: Secondary | ICD-10-CM

## 2022-03-12 DIAGNOSIS — F3341 Major depressive disorder, recurrent, in partial remission: Secondary | ICD-10-CM

## 2022-03-12 DIAGNOSIS — I10 Essential (primary) hypertension: Secondary | ICD-10-CM | POA: Diagnosis not present

## 2022-03-12 DIAGNOSIS — R69 Illness, unspecified: Secondary | ICD-10-CM | POA: Diagnosis not present

## 2022-03-12 MED ORDER — BUPROPION HCL ER (XL) 150 MG PO TB24: 150.0000 mg | ORAL_TABLET | ORAL | 2 refills | Status: DC

## 2022-03-13 ENCOUNTER — Encounter: Payer: Self-pay | Admitting: Nurse Practitioner

## 2022-03-13 ENCOUNTER — Other Ambulatory Visit: Payer: Self-pay | Admitting: Nurse Practitioner

## 2022-03-13 DIAGNOSIS — R7989 Other specified abnormal findings of blood chemistry: Secondary | ICD-10-CM

## 2022-03-13 DIAGNOSIS — E785 Hyperlipidemia, unspecified: Secondary | ICD-10-CM

## 2022-03-13 LAB — COMPLETE METABOLIC PANEL WITH GFR
AG Ratio: 2.2 (calc) (ref 1.0–2.5)
ALT: 62 U/L — ABNORMAL HIGH (ref 6–29)
AST: 42 U/L — ABNORMAL HIGH (ref 10–35)
Albumin: 4.7 g/dL (ref 3.6–5.1)
Alkaline phosphatase (APISO): 97 U/L (ref 37–153)
BUN: 11 mg/dL (ref 7–25)
CO2: 25 mmol/L (ref 20–32)
Calcium: 10.1 mg/dL (ref 8.6–10.4)
Chloride: 103 mmol/L (ref 98–110)
Creat: 0.77 mg/dL (ref 0.50–1.03)
Globulin: 2.1 g/dL (calc) (ref 1.9–3.7)
Glucose, Bld: 85 mg/dL (ref 65–99)
Potassium: 4.2 mmol/L (ref 3.5–5.3)
Sodium: 139 mmol/L (ref 135–146)
Total Bilirubin: 0.5 mg/dL (ref 0.2–1.2)
Total Protein: 6.8 g/dL (ref 6.1–8.1)
eGFR: 89 mL/min/{1.73_m2} (ref >=60)

## 2022-03-13 LAB — CBC WITH DIFFERENTIAL/PLATELET
Absolute Monocytes: 553 cells/uL (ref 200–950)
Basophils Absolute: 70 cells/uL (ref 0–200)
Basophils Relative: 1 %
Eosinophils Absolute: 161 cells/uL (ref 15–500)
Eosinophils Relative: 2.3 %
HCT: 47.1 % — ABNORMAL HIGH (ref 35.0–45.0)
Hemoglobin: 16.4 g/dL — ABNORMAL HIGH (ref 11.7–15.5)
Lymphs Abs: 2023 cells/uL (ref 850–3900)
MCH: 31.7 pg (ref 27.0–33.0)
MCHC: 34.8 g/dL (ref 32.0–36.0)
MCV: 91.1 fL (ref 80.0–100.0)
MPV: 11.4 fL (ref 7.5–12.5)
Monocytes Relative: 7.9 %
Neutro Abs: 4193 cells/uL (ref 1500–7800)
Neutrophils Relative %: 59.9 %
Platelets: 240 10*3/uL (ref 140–400)
RBC: 5.17 10*6/uL — ABNORMAL HIGH (ref 3.80–5.10)
RDW: 13.5 % (ref 11.0–15.0)
Total Lymphocyte: 28.9 %
WBC: 7 10*3/uL (ref 3.8–10.8)

## 2022-03-13 LAB — HEMOGLOBIN A1C
Hgb A1c MFr Bld: 5.3 % of total Hgb (ref <5.7)
Mean Plasma Glucose: 105 mg/dL
eAG (mmol/L): 5.8 mmol/L

## 2022-03-13 LAB — LIPID PANEL
Cholesterol: 226 mg/dL — ABNORMAL HIGH (ref <200)
HDL: 47 mg/dL — ABNORMAL LOW (ref >=50)
LDL Cholesterol (Calc): 150 mg/dL (calc) — ABNORMAL HIGH
Non-HDL Cholesterol (Calc): 179 mg/dL (calc) — ABNORMAL HIGH (ref <130)
Total CHOL/HDL Ratio: 4.8 (calc) (ref <5.0)
Triglycerides: 158 mg/dL — ABNORMAL HIGH (ref <150)

## 2022-03-13 LAB — TSH: TSH: 0.58 mIU/L (ref 0.40–4.50)

## 2022-03-13 MED ORDER — ROSUVASTATIN CALCIUM 5 MG PO TABS: 5.0000 mg | ORAL_TABLET | Freq: Every day | ORAL | 11 refills | Status: DC

## 2022-03-19 ENCOUNTER — Ambulatory Visit
Admission: RE | Admit: 2022-03-19 | Discharge: 2022-03-19 | Disposition: A | Discharge status: Home / Self Care | Payer: 59 | Source: Ambulatory Visit | Attending: Nurse Practitioner | Admitting: Nurse Practitioner

## 2022-03-19 DIAGNOSIS — R945 Abnormal results of liver function studies: Secondary | ICD-10-CM | POA: Diagnosis not present

## 2022-03-19 DIAGNOSIS — Z9049 Acquired absence of other specified parts of digestive tract: Secondary | ICD-10-CM | POA: Diagnosis not present

## 2022-03-19 DIAGNOSIS — R7989 Other specified abnormal findings of blood chemistry: Secondary | ICD-10-CM

## 2022-03-19 DIAGNOSIS — K760 Fatty (change of) liver, not elsewhere classified: Secondary | ICD-10-CM | POA: Diagnosis not present

## 2022-04-01 DIAGNOSIS — M25562 Pain in left knee: Secondary | ICD-10-CM | POA: Diagnosis not present

## 2022-04-01 DIAGNOSIS — M1712 Unilateral primary osteoarthritis, left knee: Secondary | ICD-10-CM | POA: Insufficient documentation

## 2022-04-10 IMAGING — CT CT ABD-PELV W/O CM
1 of 2 series · 15 of 32 positions shown, 19 images · non-contrast
Comparison: 05/12/2006

CLINICAL DATA: Bilateral flank pain, history of nephrolithiasis



[Series 2: a/p w/o 5mm · axial · non-contrast · 0.98mm/px · z∈[-531,-71]mm · 15 of 100 slices shown, 19 images]
[im 4/100  soft-tissue]
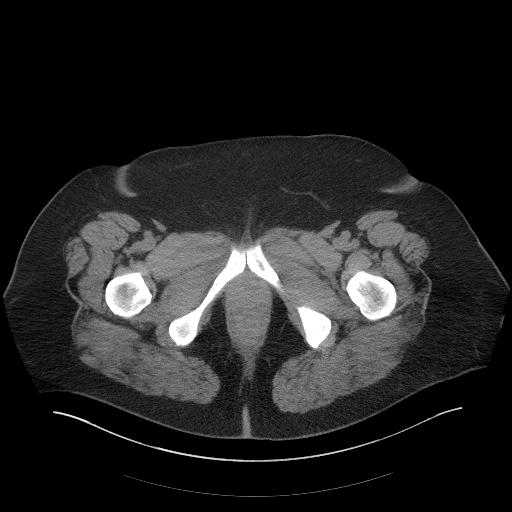
[im 4/100  bone]
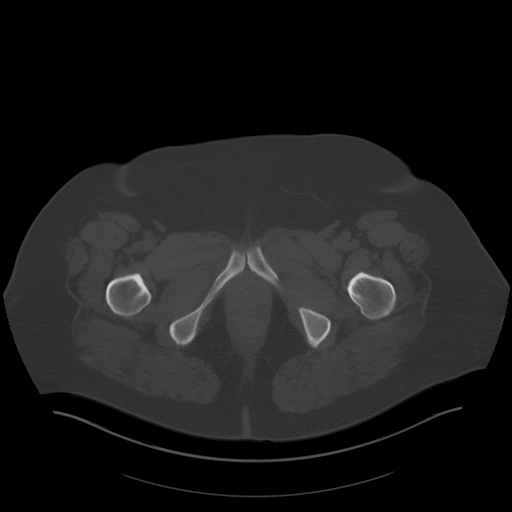
[im 12/100  soft-tissue]
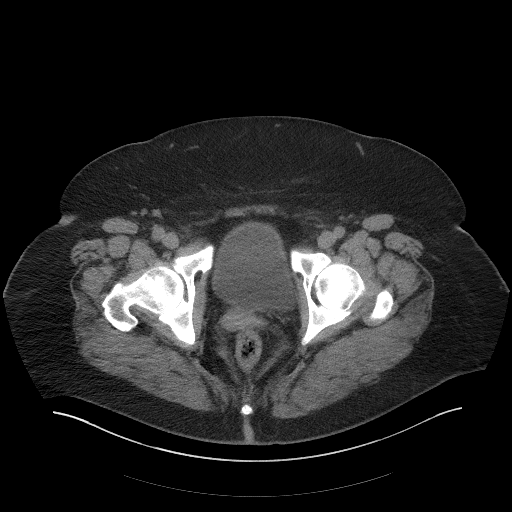
[im 20/100  soft-tissue]
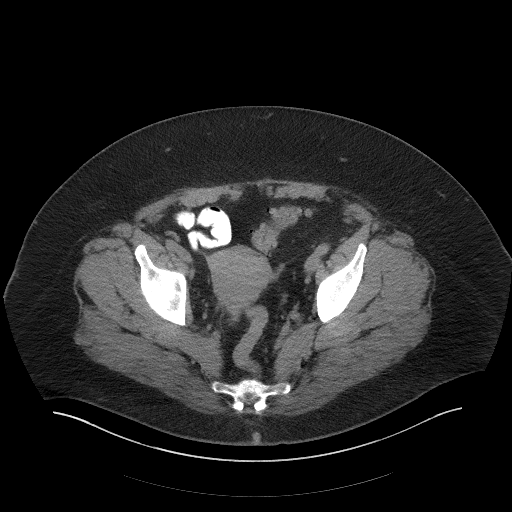
[im 28/100  soft-tissue]
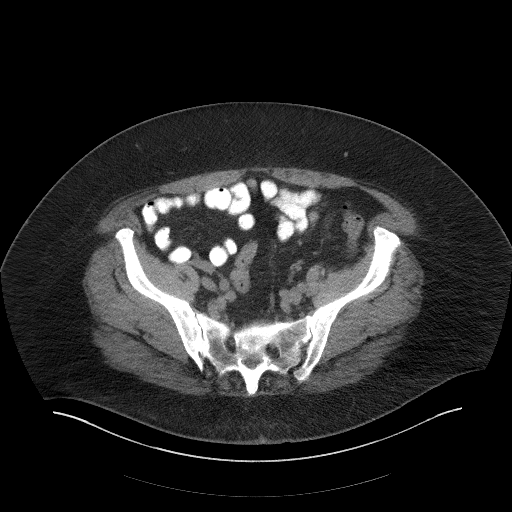
[im 36/100  soft-tissue]
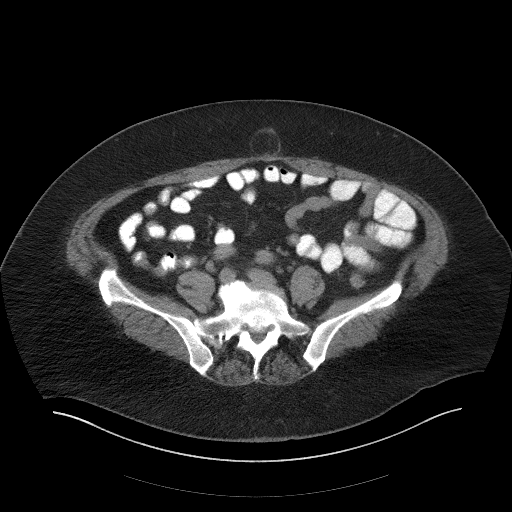
[im 44/100  soft-tissue]
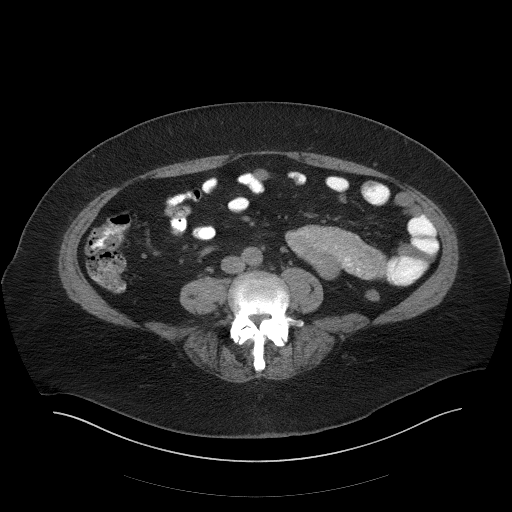
[im 52/100  soft-tissue]
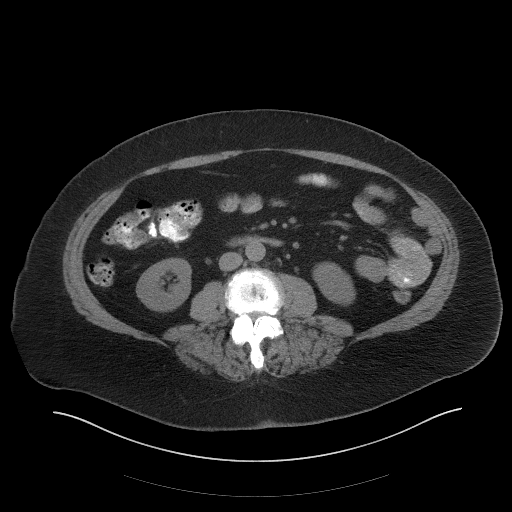
[im 56/100  soft-tissue]
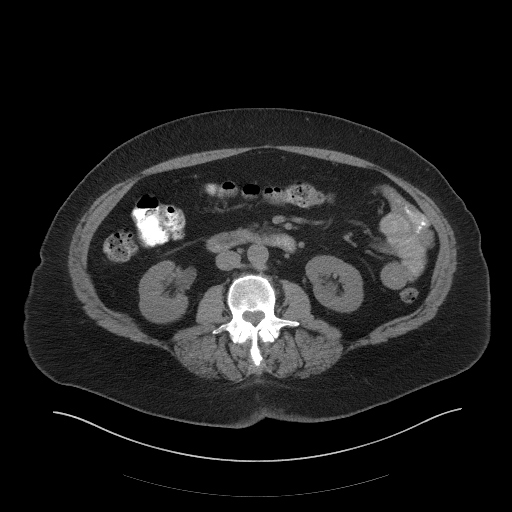
[im 64/100  soft-tissue]
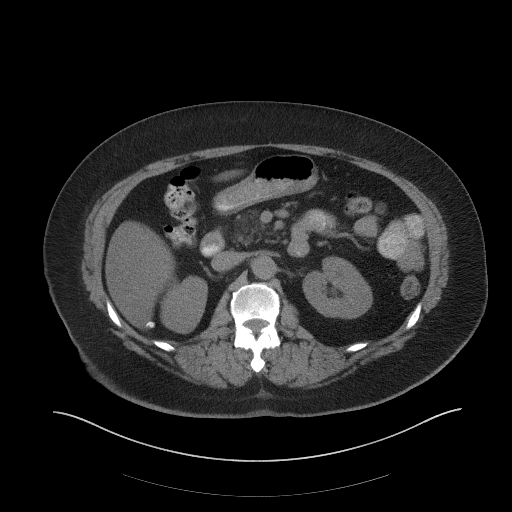
[im 64/100  bone]
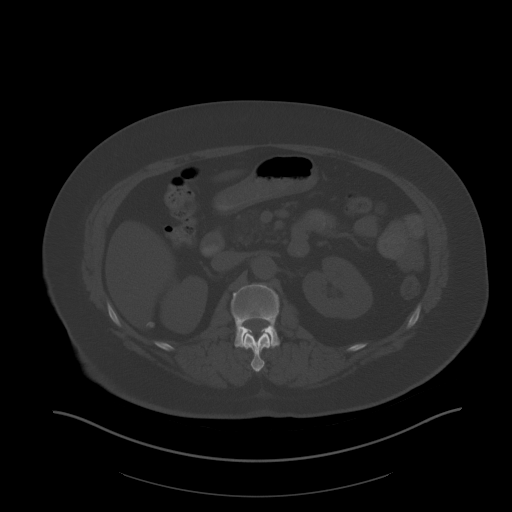
[im 72/100  soft-tissue]
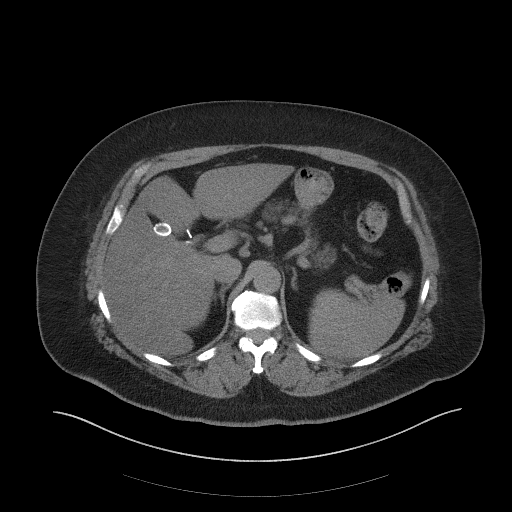
[im 80/100  soft-tissue]
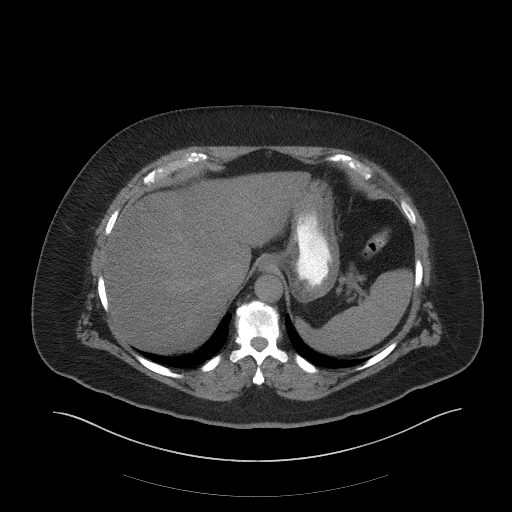
[im 84/100  lung]
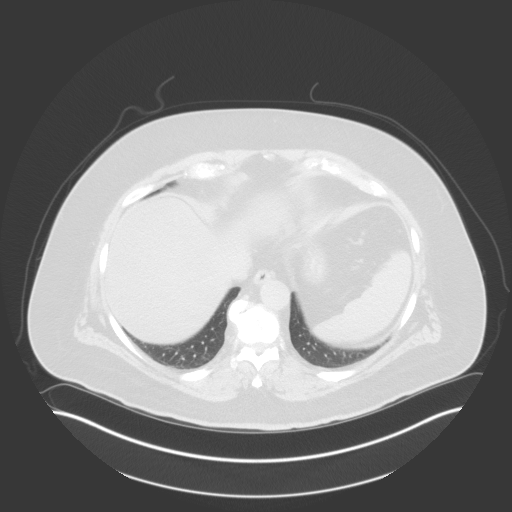
[im 88/100  soft-tissue]
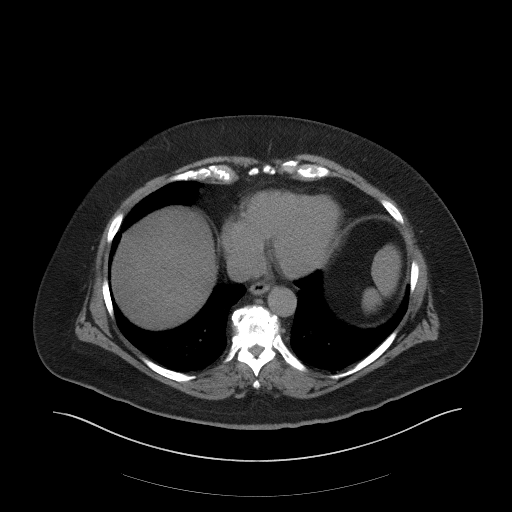
[im 88/100  lung]
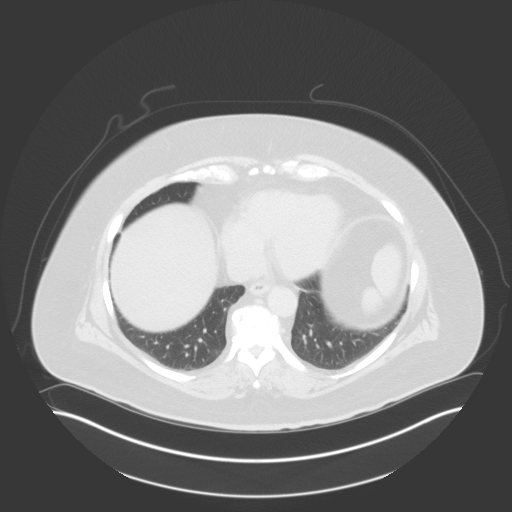
[im 92/100  lung]
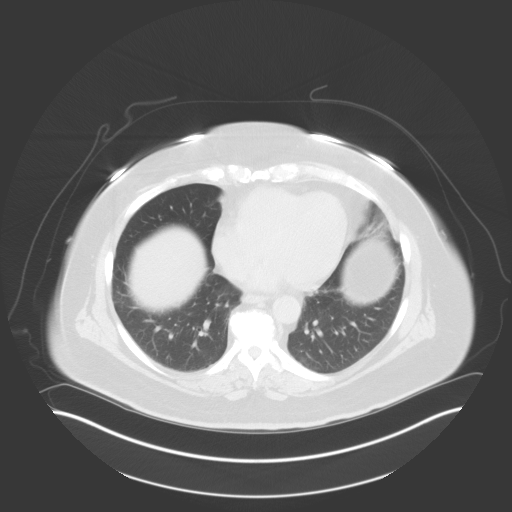
[im 96/100  soft-tissue]
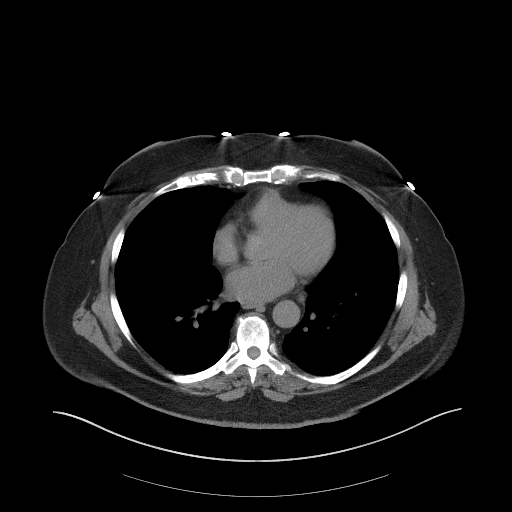
[im 96/100  lung]
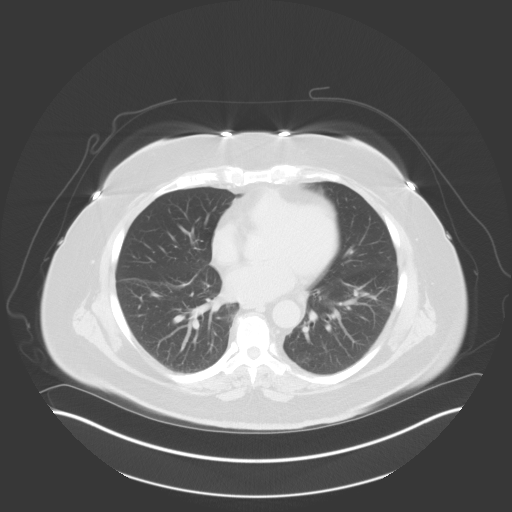

[15 of 32 positions shown; findings below may reference images not displayed]

FINDINGS: Lower chest: No pleural or pericardial effusion.

Hepatobiliary: Fatty liver. Cholecystectomy clips. 1.9 cm
peripherally calcified process in the gallbladder fossa as before. 1
cm peripherally calcified process adjacent to the capsule of hepatic
segment 6 (Im36,Se2) . no biliary ductal dilatation.

Pancreas: Diffuse parenchymal atrophy without mass or ductal
dilatation.

Spleen: Normal in size without focal abnormality.

Adrenals/Urinary Tract: Adrenal glands are unremarkable. Kidneys are
normal, without renal calculi, focal lesion, or hydronephrosis.
Bladder is unremarkable.

Stomach/Bowel: Stomach is incompletely distended, unremarkable.
Small bowel is nondilated, with good distal passage of oral contrast
material. Post appendectomy. The colon is nondistended with
scattered diverticula most numerous in the sigmoid segment; no
adjacent inflammatory change.

Vascular/Lymphatic: No significant arterial abnormalities.
Circumaortic left renal vein is suspected, an anatomic variant. No
abdominal or pelvic adenopathy.

Reproductive: Uterus and bilateral adnexa are unremarkable.

Other: No ascites.  No free air.

Musculoskeletal: Small paraumbilical hernia containing only
mesenteric fat. Spondylitic changes in the lower thoracic and lumbar
spine.
IMPRESSION: 1. No urolithiasis or hydronephrosis.
2. Colonic diverticulosis
3. Fatty liver
4. Multilevel lumbar spondylitic change.

## 2022-04-26 ENCOUNTER — Encounter: Payer: Self-pay | Admitting: Nurse Practitioner

## 2022-04-26 ENCOUNTER — Other Ambulatory Visit: Payer: Self-pay | Admitting: Nurse Practitioner

## 2022-04-26 DIAGNOSIS — I10 Essential (primary) hypertension: Secondary | ICD-10-CM

## 2022-04-26 MED ORDER — OLMESARTAN MEDOXOMIL 5 MG PO TABS: 10.0000 mg | ORAL_TABLET | Freq: Every day | ORAL | 3 refills | Status: DC

## 2022-05-29 DIAGNOSIS — F32A Depression, unspecified: Secondary | ICD-10-CM | POA: Insufficient documentation

## 2022-05-30 DIAGNOSIS — Z79899 Other long term (current) drug therapy: Secondary | ICD-10-CM | POA: Diagnosis not present

## 2022-05-30 DIAGNOSIS — E039 Hypothyroidism, unspecified: Secondary | ICD-10-CM | POA: Diagnosis not present

## 2022-05-30 DIAGNOSIS — Z96651 Presence of right artificial knee joint: Secondary | ICD-10-CM | POA: Insufficient documentation

## 2022-05-30 DIAGNOSIS — F32A Depression, unspecified: Secondary | ICD-10-CM | POA: Diagnosis not present

## 2022-05-30 DIAGNOSIS — Z7982 Long term (current) use of aspirin: Secondary | ICD-10-CM | POA: Diagnosis not present

## 2022-05-30 DIAGNOSIS — Z7901 Long term (current) use of anticoagulants: Secondary | ICD-10-CM | POA: Diagnosis not present

## 2022-05-30 DIAGNOSIS — I10 Essential (primary) hypertension: Secondary | ICD-10-CM | POA: Diagnosis not present

## 2022-05-30 DIAGNOSIS — E785 Hyperlipidemia, unspecified: Secondary | ICD-10-CM | POA: Diagnosis not present

## 2022-05-30 DIAGNOSIS — M1712 Unilateral primary osteoarthritis, left knee: Secondary | ICD-10-CM | POA: Diagnosis not present

## 2022-05-30 DIAGNOSIS — Z79890 Hormone replacement therapy (postmenopausal): Secondary | ICD-10-CM | POA: Diagnosis not present

## 2022-05-30 DIAGNOSIS — R69 Illness, unspecified: Secondary | ICD-10-CM | POA: Diagnosis not present

## 2022-06-10 DIAGNOSIS — Z7982 Long term (current) use of aspirin: Secondary | ICD-10-CM | POA: Diagnosis not present

## 2022-06-10 DIAGNOSIS — F32A Depression, unspecified: Secondary | ICD-10-CM | POA: Diagnosis not present

## 2022-06-10 DIAGNOSIS — Z79890 Hormone replacement therapy (postmenopausal): Secondary | ICD-10-CM | POA: Diagnosis not present

## 2022-06-10 DIAGNOSIS — Z79899 Other long term (current) drug therapy: Secondary | ICD-10-CM | POA: Diagnosis not present

## 2022-06-10 DIAGNOSIS — M1712 Unilateral primary osteoarthritis, left knee: Secondary | ICD-10-CM | POA: Diagnosis not present

## 2022-06-10 DIAGNOSIS — E785 Hyperlipidemia, unspecified: Secondary | ICD-10-CM | POA: Diagnosis not present

## 2022-06-10 DIAGNOSIS — Z7901 Long term (current) use of anticoagulants: Secondary | ICD-10-CM | POA: Diagnosis not present

## 2022-06-10 DIAGNOSIS — I10 Essential (primary) hypertension: Secondary | ICD-10-CM | POA: Diagnosis not present

## 2022-06-10 DIAGNOSIS — E039 Hypothyroidism, unspecified: Secondary | ICD-10-CM | POA: Diagnosis not present

## 2022-06-19 DIAGNOSIS — M25562 Pain in left knee: Secondary | ICD-10-CM | POA: Insufficient documentation

## 2022-06-20 DIAGNOSIS — I10 Essential (primary) hypertension: Secondary | ICD-10-CM | POA: Diagnosis not present

## 2022-06-20 DIAGNOSIS — M1712 Unilateral primary osteoarthritis, left knee: Secondary | ICD-10-CM | POA: Diagnosis not present

## 2022-06-20 DIAGNOSIS — G8918 Other acute postprocedural pain: Secondary | ICD-10-CM | POA: Diagnosis not present

## 2022-06-20 DIAGNOSIS — E039 Hypothyroidism, unspecified: Secondary | ICD-10-CM | POA: Diagnosis not present

## 2022-06-20 DIAGNOSIS — Z01818 Encounter for other preprocedural examination: Secondary | ICD-10-CM | POA: Diagnosis not present

## 2022-06-21 DIAGNOSIS — Z01818 Encounter for other preprocedural examination: Secondary | ICD-10-CM | POA: Diagnosis not present

## 2022-06-21 DIAGNOSIS — M1712 Unilateral primary osteoarthritis, left knee: Secondary | ICD-10-CM | POA: Diagnosis not present

## 2022-06-25 DIAGNOSIS — Z5181 Encounter for therapeutic drug level monitoring: Secondary | ICD-10-CM | POA: Diagnosis not present

## 2022-06-25 DIAGNOSIS — Z79891 Long term (current) use of opiate analgesic: Secondary | ICD-10-CM | POA: Diagnosis not present

## 2022-06-25 DIAGNOSIS — Z7901 Long term (current) use of anticoagulants: Secondary | ICD-10-CM | POA: Diagnosis not present

## 2022-06-25 DIAGNOSIS — Z96652 Presence of left artificial knee joint: Secondary | ICD-10-CM | POA: Diagnosis not present

## 2022-06-25 DIAGNOSIS — Z6836 Body mass index (BMI) 36.0-36.9, adult: Secondary | ICD-10-CM | POA: Diagnosis not present

## 2022-06-25 DIAGNOSIS — E669 Obesity, unspecified: Secondary | ICD-10-CM | POA: Diagnosis not present

## 2022-06-25 DIAGNOSIS — R69 Illness, unspecified: Secondary | ICD-10-CM | POA: Diagnosis not present

## 2022-06-25 DIAGNOSIS — I10 Essential (primary) hypertension: Secondary | ICD-10-CM | POA: Diagnosis not present

## 2022-06-25 DIAGNOSIS — E039 Hypothyroidism, unspecified: Secondary | ICD-10-CM | POA: Diagnosis not present

## 2022-06-25 DIAGNOSIS — Z4789 Encounter for other orthopedic aftercare: Secondary | ICD-10-CM | POA: Diagnosis not present

## 2022-06-27 DIAGNOSIS — Z4789 Encounter for other orthopedic aftercare: Secondary | ICD-10-CM | POA: Diagnosis not present

## 2022-06-27 DIAGNOSIS — Z5181 Encounter for therapeutic drug level monitoring: Secondary | ICD-10-CM | POA: Diagnosis not present

## 2022-06-27 DIAGNOSIS — I10 Essential (primary) hypertension: Secondary | ICD-10-CM | POA: Diagnosis not present

## 2022-06-27 DIAGNOSIS — Z79891 Long term (current) use of opiate analgesic: Secondary | ICD-10-CM | POA: Diagnosis not present

## 2022-06-27 DIAGNOSIS — Z96652 Presence of left artificial knee joint: Secondary | ICD-10-CM | POA: Diagnosis not present

## 2022-06-27 DIAGNOSIS — E039 Hypothyroidism, unspecified: Secondary | ICD-10-CM | POA: Diagnosis not present

## 2022-06-27 DIAGNOSIS — Z7901 Long term (current) use of anticoagulants: Secondary | ICD-10-CM | POA: Diagnosis not present

## 2022-06-27 DIAGNOSIS — Z6836 Body mass index (BMI) 36.0-36.9, adult: Secondary | ICD-10-CM | POA: Diagnosis not present

## 2022-06-27 DIAGNOSIS — R69 Illness, unspecified: Secondary | ICD-10-CM | POA: Diagnosis not present

## 2022-06-27 DIAGNOSIS — E669 Obesity, unspecified: Secondary | ICD-10-CM | POA: Diagnosis not present

## 2022-06-28 ENCOUNTER — Other Ambulatory Visit: Payer: Self-pay | Admitting: Nurse Practitioner

## 2022-06-28 DIAGNOSIS — F3341 Major depressive disorder, recurrent, in partial remission: Secondary | ICD-10-CM

## 2022-07-01 DIAGNOSIS — Z6836 Body mass index (BMI) 36.0-36.9, adult: Secondary | ICD-10-CM | POA: Diagnosis not present

## 2022-07-01 DIAGNOSIS — E669 Obesity, unspecified: Secondary | ICD-10-CM | POA: Diagnosis not present

## 2022-07-01 DIAGNOSIS — Z79891 Long term (current) use of opiate analgesic: Secondary | ICD-10-CM | POA: Diagnosis not present

## 2022-07-01 DIAGNOSIS — Z5181 Encounter for therapeutic drug level monitoring: Secondary | ICD-10-CM | POA: Diagnosis not present

## 2022-07-01 DIAGNOSIS — R69 Illness, unspecified: Secondary | ICD-10-CM | POA: Diagnosis not present

## 2022-07-01 DIAGNOSIS — Z4789 Encounter for other orthopedic aftercare: Secondary | ICD-10-CM | POA: Diagnosis not present

## 2022-07-01 DIAGNOSIS — Z7901 Long term (current) use of anticoagulants: Secondary | ICD-10-CM | POA: Diagnosis not present

## 2022-07-01 DIAGNOSIS — Z96652 Presence of left artificial knee joint: Secondary | ICD-10-CM | POA: Diagnosis not present

## 2022-07-01 DIAGNOSIS — I10 Essential (primary) hypertension: Secondary | ICD-10-CM | POA: Diagnosis not present

## 2022-07-01 DIAGNOSIS — E039 Hypothyroidism, unspecified: Secondary | ICD-10-CM | POA: Diagnosis not present

## 2022-07-03 DIAGNOSIS — Z79891 Long term (current) use of opiate analgesic: Secondary | ICD-10-CM | POA: Diagnosis not present

## 2022-07-03 DIAGNOSIS — R69 Illness, unspecified: Secondary | ICD-10-CM | POA: Diagnosis not present

## 2022-07-03 DIAGNOSIS — I10 Essential (primary) hypertension: Secondary | ICD-10-CM | POA: Diagnosis not present

## 2022-07-03 DIAGNOSIS — Z6836 Body mass index (BMI) 36.0-36.9, adult: Secondary | ICD-10-CM | POA: Diagnosis not present

## 2022-07-03 DIAGNOSIS — E039 Hypothyroidism, unspecified: Secondary | ICD-10-CM | POA: Diagnosis not present

## 2022-07-03 DIAGNOSIS — Z5181 Encounter for therapeutic drug level monitoring: Secondary | ICD-10-CM | POA: Diagnosis not present

## 2022-07-03 DIAGNOSIS — Z7901 Long term (current) use of anticoagulants: Secondary | ICD-10-CM | POA: Diagnosis not present

## 2022-07-03 DIAGNOSIS — E669 Obesity, unspecified: Secondary | ICD-10-CM | POA: Diagnosis not present

## 2022-07-03 DIAGNOSIS — Z4789 Encounter for other orthopedic aftercare: Secondary | ICD-10-CM | POA: Diagnosis not present

## 2022-07-03 DIAGNOSIS — Z96652 Presence of left artificial knee joint: Secondary | ICD-10-CM | POA: Diagnosis not present

## 2022-07-04 DIAGNOSIS — Z4789 Encounter for other orthopedic aftercare: Secondary | ICD-10-CM | POA: Diagnosis not present

## 2022-07-04 DIAGNOSIS — Z79891 Long term (current) use of opiate analgesic: Secondary | ICD-10-CM | POA: Diagnosis not present

## 2022-07-04 DIAGNOSIS — E039 Hypothyroidism, unspecified: Secondary | ICD-10-CM | POA: Diagnosis not present

## 2022-07-04 DIAGNOSIS — Z5181 Encounter for therapeutic drug level monitoring: Secondary | ICD-10-CM | POA: Diagnosis not present

## 2022-07-04 DIAGNOSIS — R69 Illness, unspecified: Secondary | ICD-10-CM | POA: Diagnosis not present

## 2022-07-04 DIAGNOSIS — Z7901 Long term (current) use of anticoagulants: Secondary | ICD-10-CM | POA: Diagnosis not present

## 2022-07-04 DIAGNOSIS — I10 Essential (primary) hypertension: Secondary | ICD-10-CM | POA: Diagnosis not present

## 2022-07-04 DIAGNOSIS — Z96652 Presence of left artificial knee joint: Secondary | ICD-10-CM | POA: Diagnosis not present

## 2022-07-04 DIAGNOSIS — Z6836 Body mass index (BMI) 36.0-36.9, adult: Secondary | ICD-10-CM | POA: Diagnosis not present

## 2022-07-04 DIAGNOSIS — E669 Obesity, unspecified: Secondary | ICD-10-CM | POA: Diagnosis not present

## 2022-07-08 DIAGNOSIS — Z6836 Body mass index (BMI) 36.0-36.9, adult: Secondary | ICD-10-CM | POA: Diagnosis not present

## 2022-07-08 DIAGNOSIS — E669 Obesity, unspecified: Secondary | ICD-10-CM | POA: Diagnosis not present

## 2022-07-08 DIAGNOSIS — R69 Illness, unspecified: Secondary | ICD-10-CM | POA: Diagnosis not present

## 2022-07-08 DIAGNOSIS — Z4789 Encounter for other orthopedic aftercare: Secondary | ICD-10-CM | POA: Diagnosis not present

## 2022-07-08 DIAGNOSIS — E039 Hypothyroidism, unspecified: Secondary | ICD-10-CM | POA: Diagnosis not present

## 2022-07-08 DIAGNOSIS — Z96652 Presence of left artificial knee joint: Secondary | ICD-10-CM | POA: Diagnosis not present

## 2022-07-08 DIAGNOSIS — Z7901 Long term (current) use of anticoagulants: Secondary | ICD-10-CM | POA: Diagnosis not present

## 2022-07-08 DIAGNOSIS — Z5181 Encounter for therapeutic drug level monitoring: Secondary | ICD-10-CM | POA: Diagnosis not present

## 2022-07-08 DIAGNOSIS — I10 Essential (primary) hypertension: Secondary | ICD-10-CM | POA: Diagnosis not present

## 2022-07-08 DIAGNOSIS — Z79891 Long term (current) use of opiate analgesic: Secondary | ICD-10-CM | POA: Diagnosis not present

## 2022-07-09 DIAGNOSIS — Z7901 Long term (current) use of anticoagulants: Secondary | ICD-10-CM | POA: Diagnosis not present

## 2022-07-09 DIAGNOSIS — Z6836 Body mass index (BMI) 36.0-36.9, adult: Secondary | ICD-10-CM | POA: Diagnosis not present

## 2022-07-09 DIAGNOSIS — Z5181 Encounter for therapeutic drug level monitoring: Secondary | ICD-10-CM | POA: Diagnosis not present

## 2022-07-09 DIAGNOSIS — Z96652 Presence of left artificial knee joint: Secondary | ICD-10-CM | POA: Diagnosis not present

## 2022-07-09 DIAGNOSIS — E039 Hypothyroidism, unspecified: Secondary | ICD-10-CM | POA: Diagnosis not present

## 2022-07-09 DIAGNOSIS — E669 Obesity, unspecified: Secondary | ICD-10-CM | POA: Diagnosis not present

## 2022-07-09 DIAGNOSIS — R69 Illness, unspecified: Secondary | ICD-10-CM | POA: Diagnosis not present

## 2022-07-09 DIAGNOSIS — Z4789 Encounter for other orthopedic aftercare: Secondary | ICD-10-CM | POA: Diagnosis not present

## 2022-07-09 DIAGNOSIS — I10 Essential (primary) hypertension: Secondary | ICD-10-CM | POA: Diagnosis not present

## 2022-07-09 DIAGNOSIS — Z79891 Long term (current) use of opiate analgesic: Secondary | ICD-10-CM | POA: Diagnosis not present

## 2022-07-10 DIAGNOSIS — E669 Obesity, unspecified: Secondary | ICD-10-CM | POA: Diagnosis not present

## 2022-07-10 DIAGNOSIS — Z6836 Body mass index (BMI) 36.0-36.9, adult: Secondary | ICD-10-CM | POA: Diagnosis not present

## 2022-07-10 DIAGNOSIS — R69 Illness, unspecified: Secondary | ICD-10-CM | POA: Diagnosis not present

## 2022-07-10 DIAGNOSIS — I10 Essential (primary) hypertension: Secondary | ICD-10-CM | POA: Diagnosis not present

## 2022-07-10 DIAGNOSIS — Z7901 Long term (current) use of anticoagulants: Secondary | ICD-10-CM | POA: Diagnosis not present

## 2022-07-10 DIAGNOSIS — Z96652 Presence of left artificial knee joint: Secondary | ICD-10-CM | POA: Diagnosis not present

## 2022-07-10 DIAGNOSIS — Z79891 Long term (current) use of opiate analgesic: Secondary | ICD-10-CM | POA: Diagnosis not present

## 2022-07-10 DIAGNOSIS — Z5181 Encounter for therapeutic drug level monitoring: Secondary | ICD-10-CM | POA: Diagnosis not present

## 2022-07-10 DIAGNOSIS — E039 Hypothyroidism, unspecified: Secondary | ICD-10-CM | POA: Diagnosis not present

## 2022-07-10 DIAGNOSIS — Z4789 Encounter for other orthopedic aftercare: Secondary | ICD-10-CM | POA: Diagnosis not present

## 2022-07-11 DIAGNOSIS — Z79891 Long term (current) use of opiate analgesic: Secondary | ICD-10-CM | POA: Diagnosis not present

## 2022-07-11 DIAGNOSIS — Z6836 Body mass index (BMI) 36.0-36.9, adult: Secondary | ICD-10-CM | POA: Diagnosis not present

## 2022-07-11 DIAGNOSIS — E039 Hypothyroidism, unspecified: Secondary | ICD-10-CM | POA: Diagnosis not present

## 2022-07-11 DIAGNOSIS — E669 Obesity, unspecified: Secondary | ICD-10-CM | POA: Diagnosis not present

## 2022-07-11 DIAGNOSIS — Z4789 Encounter for other orthopedic aftercare: Secondary | ICD-10-CM | POA: Diagnosis not present

## 2022-07-11 DIAGNOSIS — F32A Depression, unspecified: Secondary | ICD-10-CM | POA: Diagnosis not present

## 2022-07-11 DIAGNOSIS — I10 Essential (primary) hypertension: Secondary | ICD-10-CM | POA: Diagnosis not present

## 2022-07-11 DIAGNOSIS — Z96652 Presence of left artificial knee joint: Secondary | ICD-10-CM | POA: Diagnosis not present

## 2022-07-11 DIAGNOSIS — Z5181 Encounter for therapeutic drug level monitoring: Secondary | ICD-10-CM | POA: Diagnosis not present

## 2022-07-11 DIAGNOSIS — Z7901 Long term (current) use of anticoagulants: Secondary | ICD-10-CM | POA: Diagnosis not present

## 2022-07-11 HISTORY — PX: REPLACEMENT TOTAL KNEE: SUR1224

## 2022-07-15 DIAGNOSIS — Z79891 Long term (current) use of opiate analgesic: Secondary | ICD-10-CM | POA: Diagnosis not present

## 2022-07-15 DIAGNOSIS — Z5181 Encounter for therapeutic drug level monitoring: Secondary | ICD-10-CM | POA: Diagnosis not present

## 2022-07-15 DIAGNOSIS — E039 Hypothyroidism, unspecified: Secondary | ICD-10-CM | POA: Diagnosis not present

## 2022-07-15 DIAGNOSIS — F32A Depression, unspecified: Secondary | ICD-10-CM | POA: Diagnosis not present

## 2022-07-15 DIAGNOSIS — Z4789 Encounter for other orthopedic aftercare: Secondary | ICD-10-CM | POA: Diagnosis not present

## 2022-07-15 DIAGNOSIS — Z6836 Body mass index (BMI) 36.0-36.9, adult: Secondary | ICD-10-CM | POA: Diagnosis not present

## 2022-07-15 DIAGNOSIS — Z96652 Presence of left artificial knee joint: Secondary | ICD-10-CM | POA: Diagnosis not present

## 2022-07-15 DIAGNOSIS — I10 Essential (primary) hypertension: Secondary | ICD-10-CM | POA: Diagnosis not present

## 2022-07-15 DIAGNOSIS — E669 Obesity, unspecified: Secondary | ICD-10-CM | POA: Diagnosis not present

## 2022-07-15 DIAGNOSIS — Z7901 Long term (current) use of anticoagulants: Secondary | ICD-10-CM | POA: Diagnosis not present

## 2022-07-17 DIAGNOSIS — Z7901 Long term (current) use of anticoagulants: Secondary | ICD-10-CM | POA: Diagnosis not present

## 2022-07-17 DIAGNOSIS — Z96652 Presence of left artificial knee joint: Secondary | ICD-10-CM | POA: Diagnosis not present

## 2022-07-17 DIAGNOSIS — Z6836 Body mass index (BMI) 36.0-36.9, adult: Secondary | ICD-10-CM | POA: Diagnosis not present

## 2022-07-17 DIAGNOSIS — E669 Obesity, unspecified: Secondary | ICD-10-CM | POA: Diagnosis not present

## 2022-07-17 DIAGNOSIS — F32A Depression, unspecified: Secondary | ICD-10-CM | POA: Diagnosis not present

## 2022-07-17 DIAGNOSIS — Z79891 Long term (current) use of opiate analgesic: Secondary | ICD-10-CM | POA: Diagnosis not present

## 2022-07-17 DIAGNOSIS — Z4789 Encounter for other orthopedic aftercare: Secondary | ICD-10-CM | POA: Diagnosis not present

## 2022-07-17 DIAGNOSIS — Z5181 Encounter for therapeutic drug level monitoring: Secondary | ICD-10-CM | POA: Diagnosis not present

## 2022-07-17 DIAGNOSIS — I10 Essential (primary) hypertension: Secondary | ICD-10-CM | POA: Diagnosis not present

## 2022-07-17 DIAGNOSIS — E039 Hypothyroidism, unspecified: Secondary | ICD-10-CM | POA: Diagnosis not present

## 2022-07-18 DIAGNOSIS — Z4789 Encounter for other orthopedic aftercare: Secondary | ICD-10-CM | POA: Diagnosis not present

## 2022-07-22 DIAGNOSIS — M25562 Pain in left knee: Secondary | ICD-10-CM | POA: Diagnosis not present

## 2022-07-22 DIAGNOSIS — M6281 Muscle weakness (generalized): Secondary | ICD-10-CM | POA: Diagnosis not present

## 2022-07-22 DIAGNOSIS — R260 Ataxic gait: Secondary | ICD-10-CM | POA: Diagnosis not present

## 2022-07-22 DIAGNOSIS — Z471 Aftercare following joint replacement surgery: Secondary | ICD-10-CM | POA: Diagnosis not present

## 2022-07-24 DIAGNOSIS — R260 Ataxic gait: Secondary | ICD-10-CM | POA: Diagnosis not present

## 2022-07-24 DIAGNOSIS — M6281 Muscle weakness (generalized): Secondary | ICD-10-CM | POA: Diagnosis not present

## 2022-07-24 DIAGNOSIS — Z471 Aftercare following joint replacement surgery: Secondary | ICD-10-CM | POA: Diagnosis not present

## 2022-07-24 DIAGNOSIS — M25562 Pain in left knee: Secondary | ICD-10-CM | POA: Diagnosis not present

## 2022-07-26 DIAGNOSIS — M25562 Pain in left knee: Secondary | ICD-10-CM | POA: Diagnosis not present

## 2022-07-26 DIAGNOSIS — R260 Ataxic gait: Secondary | ICD-10-CM | POA: Diagnosis not present

## 2022-07-26 DIAGNOSIS — Z471 Aftercare following joint replacement surgery: Secondary | ICD-10-CM | POA: Diagnosis not present

## 2022-07-26 DIAGNOSIS — M6281 Muscle weakness (generalized): Secondary | ICD-10-CM | POA: Diagnosis not present

## 2022-07-29 DIAGNOSIS — M6281 Muscle weakness (generalized): Secondary | ICD-10-CM | POA: Diagnosis not present

## 2022-07-29 DIAGNOSIS — Z471 Aftercare following joint replacement surgery: Secondary | ICD-10-CM | POA: Diagnosis not present

## 2022-07-29 DIAGNOSIS — R260 Ataxic gait: Secondary | ICD-10-CM | POA: Diagnosis not present

## 2022-07-29 DIAGNOSIS — M25562 Pain in left knee: Secondary | ICD-10-CM | POA: Diagnosis not present

## 2022-07-30 IMAGING — MG MM DIGITAL SCREENING BILAT W/ TOMO AND CAD
8 of 14 series · 8 of 40 positions shown · non-contrast
Comparison: Previous exam(s).

CLINICAL DATA: Screening.

EXAM:
DIGITAL SCREENING BILATERAL MAMMOGRAM WITH TOMOSYNTHESIS AND CAD
TECHNIQUE: Bilateral screening digital craniocaudal and mediolateral oblique
mammograms were obtained. Bilateral screening digital breast
tomosynthesis was performed. The images were evaluated with
computer-aided detection.

[R CC synth-2D (1 of 3)]
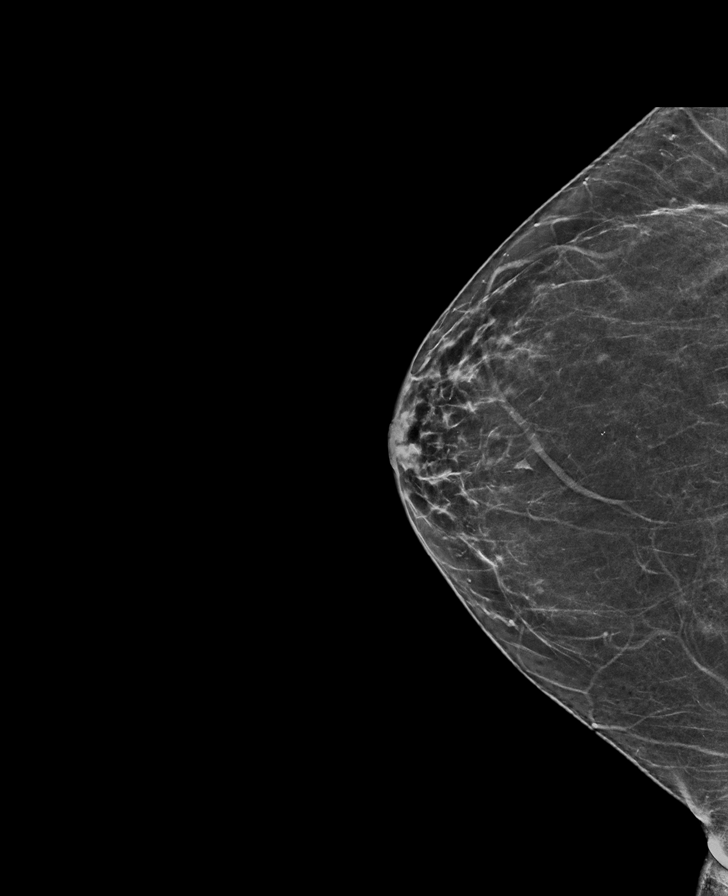

[R MLO synth-2D]
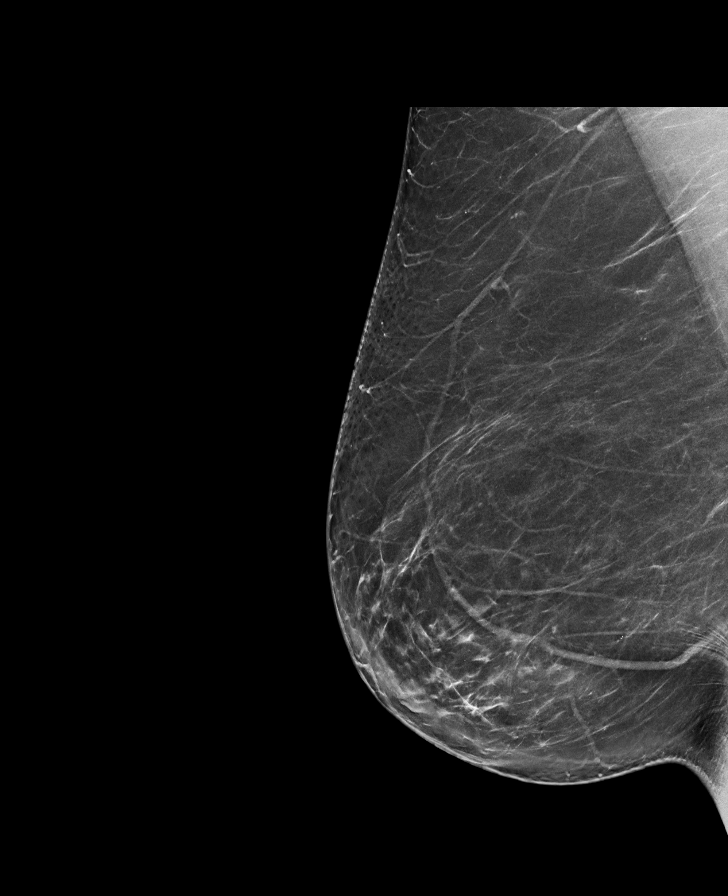

[L CC synth-2D (1 of 2)]
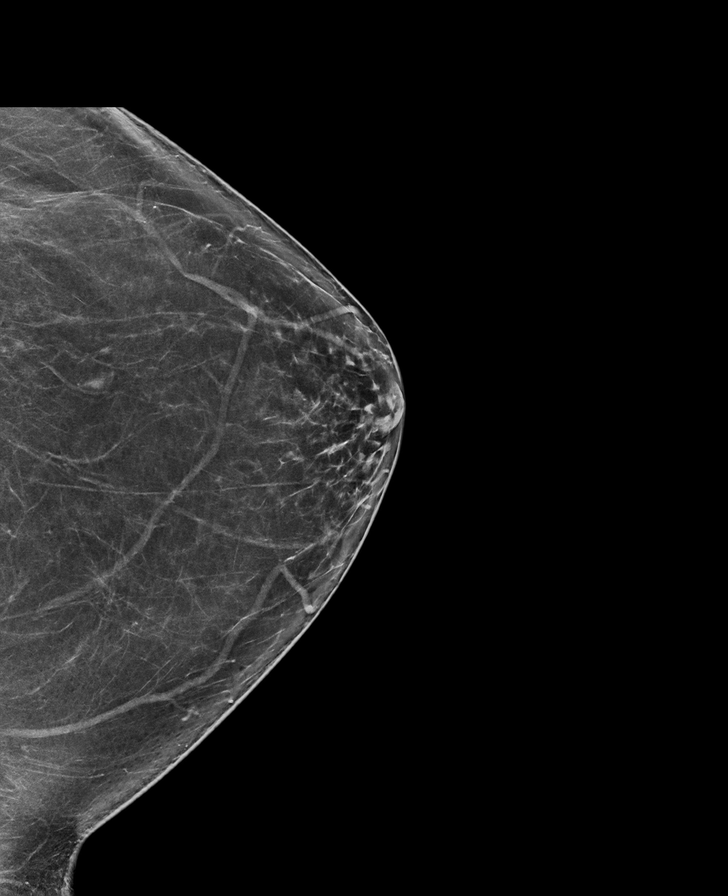

[L CC synth-2D (2 of 2)]
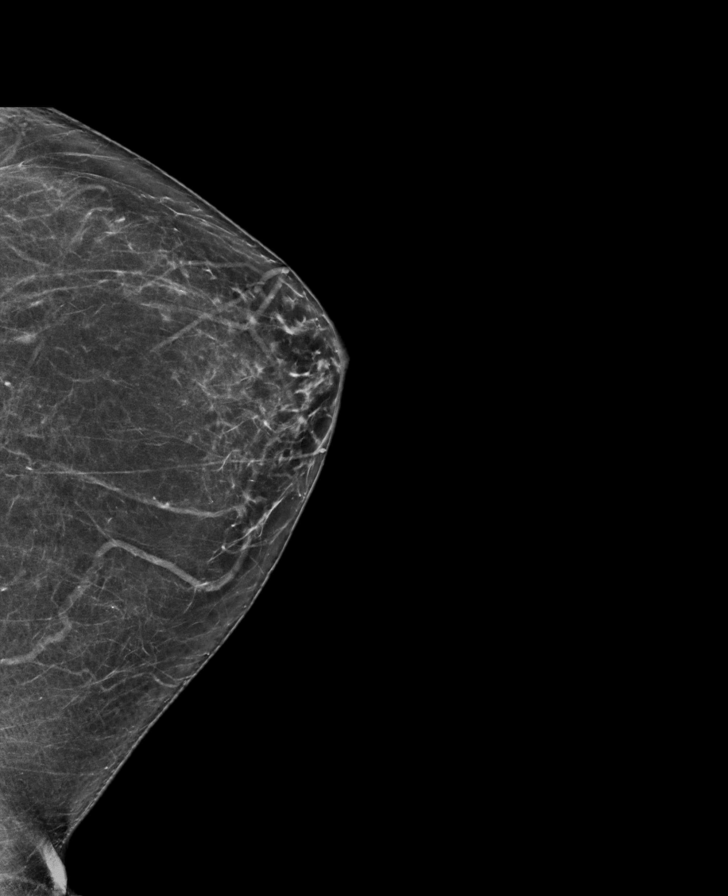

[L MLO synth-2D]
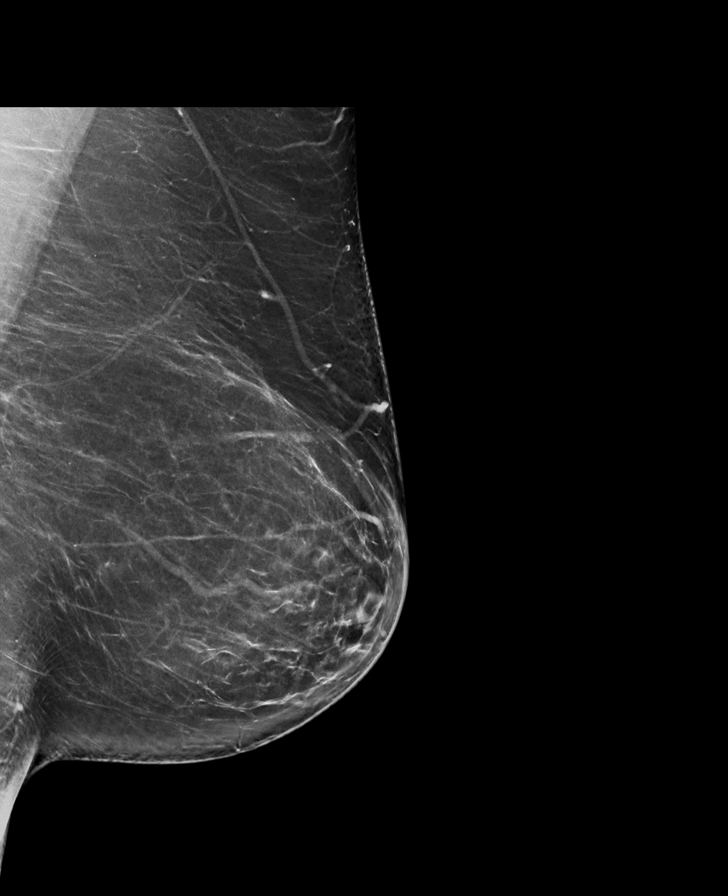

[R CC synth-2D (2 of 3)]
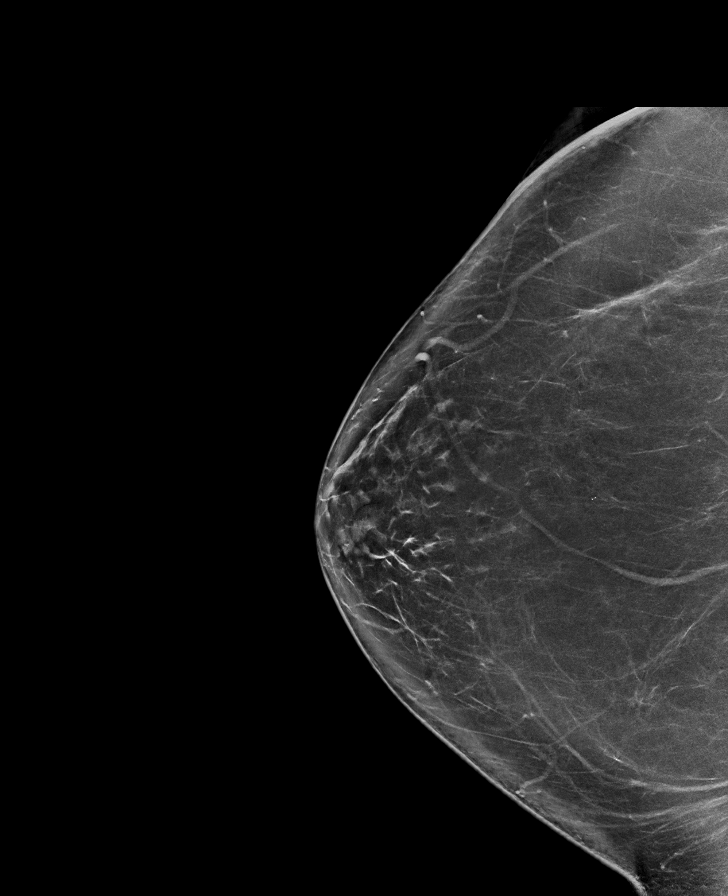

[R CC synth-2D (3 of 3)]
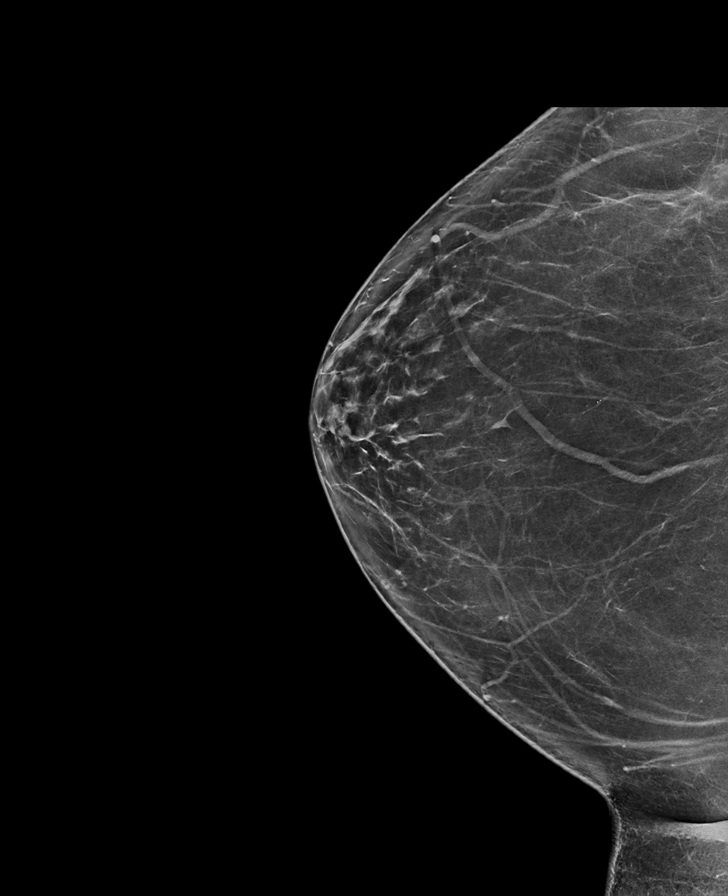

[L CC tomo · tomo slice 41/81.0]
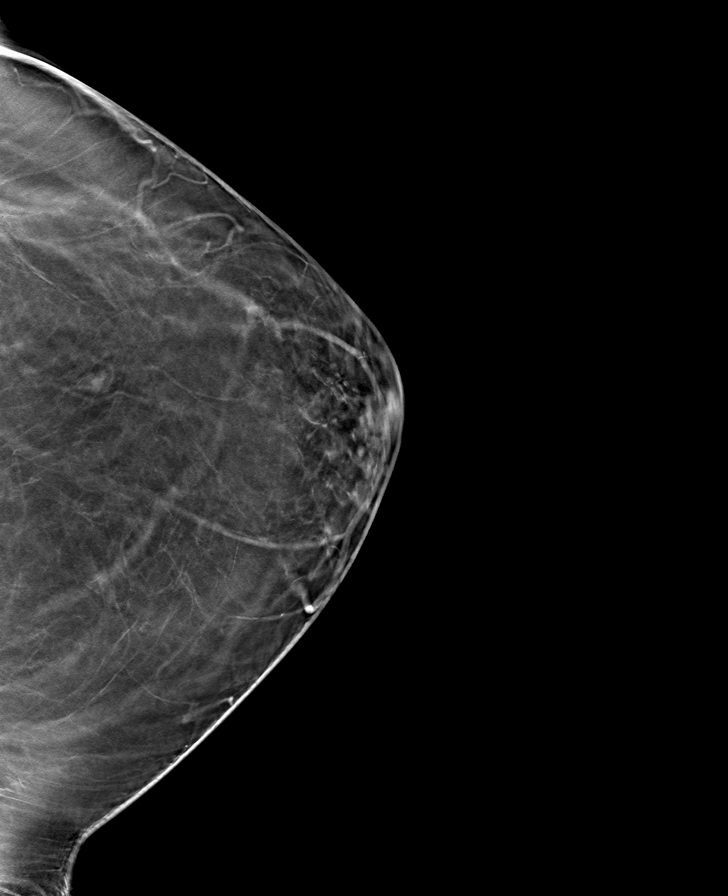

[8 of 40 positions shown; findings below may reference images not displayed]

ACR Breast Density Category b: There are scattered areas of
fibroglandular density.
FINDINGS: There are no findings suspicious for malignancy.
IMPRESSION: No mammographic evidence of malignancy. A result letter of this
screening mammogram will be mailed directly to the patient.

RECOMMENDATION:
Screening mammogram in one year. (Code:51-O-LD2)

BI-RADS CATEGORY  1: Negative.

## 2022-07-31 DIAGNOSIS — M6281 Muscle weakness (generalized): Secondary | ICD-10-CM | POA: Diagnosis not present

## 2022-07-31 DIAGNOSIS — Z471 Aftercare following joint replacement surgery: Secondary | ICD-10-CM | POA: Diagnosis not present

## 2022-07-31 DIAGNOSIS — R260 Ataxic gait: Secondary | ICD-10-CM | POA: Diagnosis not present

## 2022-07-31 DIAGNOSIS — M25562 Pain in left knee: Secondary | ICD-10-CM | POA: Diagnosis not present

## 2022-08-02 DIAGNOSIS — M25562 Pain in left knee: Secondary | ICD-10-CM | POA: Diagnosis not present

## 2022-08-02 DIAGNOSIS — M6281 Muscle weakness (generalized): Secondary | ICD-10-CM | POA: Diagnosis not present

## 2022-08-02 DIAGNOSIS — Z471 Aftercare following joint replacement surgery: Secondary | ICD-10-CM | POA: Diagnosis not present

## 2022-08-02 DIAGNOSIS — R260 Ataxic gait: Secondary | ICD-10-CM | POA: Diagnosis not present

## 2022-08-05 DIAGNOSIS — M6281 Muscle weakness (generalized): Secondary | ICD-10-CM | POA: Diagnosis not present

## 2022-08-05 DIAGNOSIS — M25562 Pain in left knee: Secondary | ICD-10-CM | POA: Diagnosis not present

## 2022-08-05 DIAGNOSIS — Z471 Aftercare following joint replacement surgery: Secondary | ICD-10-CM | POA: Diagnosis not present

## 2022-08-05 DIAGNOSIS — R260 Ataxic gait: Secondary | ICD-10-CM | POA: Diagnosis not present

## 2022-08-07 DIAGNOSIS — M6281 Muscle weakness (generalized): Secondary | ICD-10-CM | POA: Diagnosis not present

## 2022-08-07 DIAGNOSIS — R260 Ataxic gait: Secondary | ICD-10-CM | POA: Diagnosis not present

## 2022-08-07 DIAGNOSIS — M25562 Pain in left knee: Secondary | ICD-10-CM | POA: Diagnosis not present

## 2022-08-07 DIAGNOSIS — Z471 Aftercare following joint replacement surgery: Secondary | ICD-10-CM | POA: Diagnosis not present

## 2022-08-09 DIAGNOSIS — R260 Ataxic gait: Secondary | ICD-10-CM | POA: Diagnosis not present

## 2022-08-09 DIAGNOSIS — M25562 Pain in left knee: Secondary | ICD-10-CM | POA: Diagnosis not present

## 2022-08-09 DIAGNOSIS — Z471 Aftercare following joint replacement surgery: Secondary | ICD-10-CM | POA: Diagnosis not present

## 2022-08-09 DIAGNOSIS — M6281 Muscle weakness (generalized): Secondary | ICD-10-CM | POA: Diagnosis not present

## 2022-08-12 ENCOUNTER — Encounter: Payer: 59 | Admitting: Nurse Practitioner

## 2022-08-12 DIAGNOSIS — R260 Ataxic gait: Secondary | ICD-10-CM | POA: Diagnosis not present

## 2022-08-12 DIAGNOSIS — Z471 Aftercare following joint replacement surgery: Secondary | ICD-10-CM | POA: Diagnosis not present

## 2022-08-12 DIAGNOSIS — M6281 Muscle weakness (generalized): Secondary | ICD-10-CM | POA: Diagnosis not present

## 2022-08-12 DIAGNOSIS — M25562 Pain in left knee: Secondary | ICD-10-CM | POA: Diagnosis not present

## 2022-08-14 DIAGNOSIS — R260 Ataxic gait: Secondary | ICD-10-CM | POA: Diagnosis not present

## 2022-08-14 DIAGNOSIS — M6281 Muscle weakness (generalized): Secondary | ICD-10-CM | POA: Diagnosis not present

## 2022-08-14 DIAGNOSIS — Z471 Aftercare following joint replacement surgery: Secondary | ICD-10-CM | POA: Diagnosis not present

## 2022-08-14 DIAGNOSIS — M25562 Pain in left knee: Secondary | ICD-10-CM | POA: Diagnosis not present

## 2022-08-16 DIAGNOSIS — Z471 Aftercare following joint replacement surgery: Secondary | ICD-10-CM | POA: Diagnosis not present

## 2022-08-16 DIAGNOSIS — R260 Ataxic gait: Secondary | ICD-10-CM | POA: Diagnosis not present

## 2022-08-16 DIAGNOSIS — M25562 Pain in left knee: Secondary | ICD-10-CM | POA: Diagnosis not present

## 2022-08-16 DIAGNOSIS — M6281 Muscle weakness (generalized): Secondary | ICD-10-CM | POA: Diagnosis not present

## 2022-08-19 DIAGNOSIS — M25562 Pain in left knee: Secondary | ICD-10-CM | POA: Diagnosis not present

## 2022-08-19 DIAGNOSIS — R260 Ataxic gait: Secondary | ICD-10-CM | POA: Diagnosis not present

## 2022-08-19 DIAGNOSIS — Z471 Aftercare following joint replacement surgery: Secondary | ICD-10-CM | POA: Diagnosis not present

## 2022-08-19 DIAGNOSIS — M6281 Muscle weakness (generalized): Secondary | ICD-10-CM | POA: Diagnosis not present

## 2022-08-23 DIAGNOSIS — M6281 Muscle weakness (generalized): Secondary | ICD-10-CM | POA: Diagnosis not present

## 2022-08-23 DIAGNOSIS — R260 Ataxic gait: Secondary | ICD-10-CM | POA: Diagnosis not present

## 2022-08-23 DIAGNOSIS — M25562 Pain in left knee: Secondary | ICD-10-CM | POA: Diagnosis not present

## 2022-08-23 DIAGNOSIS — Z471 Aftercare following joint replacement surgery: Secondary | ICD-10-CM | POA: Diagnosis not present

## 2022-08-26 DIAGNOSIS — Z471 Aftercare following joint replacement surgery: Secondary | ICD-10-CM | POA: Diagnosis not present

## 2022-08-26 DIAGNOSIS — M6281 Muscle weakness (generalized): Secondary | ICD-10-CM | POA: Diagnosis not present

## 2022-08-26 DIAGNOSIS — M25562 Pain in left knee: Secondary | ICD-10-CM | POA: Diagnosis not present

## 2022-08-26 DIAGNOSIS — R260 Ataxic gait: Secondary | ICD-10-CM | POA: Diagnosis not present

## 2022-08-28 DIAGNOSIS — M6281 Muscle weakness (generalized): Secondary | ICD-10-CM | POA: Diagnosis not present

## 2022-08-28 DIAGNOSIS — R260 Ataxic gait: Secondary | ICD-10-CM | POA: Diagnosis not present

## 2022-08-28 DIAGNOSIS — Z471 Aftercare following joint replacement surgery: Secondary | ICD-10-CM | POA: Diagnosis not present

## 2022-08-28 DIAGNOSIS — M25562 Pain in left knee: Secondary | ICD-10-CM | POA: Diagnosis not present

## 2022-08-30 DIAGNOSIS — R260 Ataxic gait: Secondary | ICD-10-CM | POA: Diagnosis not present

## 2022-08-30 DIAGNOSIS — Z471 Aftercare following joint replacement surgery: Secondary | ICD-10-CM | POA: Diagnosis not present

## 2022-08-30 DIAGNOSIS — M25562 Pain in left knee: Secondary | ICD-10-CM | POA: Diagnosis not present

## 2022-08-30 DIAGNOSIS — M6281 Muscle weakness (generalized): Secondary | ICD-10-CM | POA: Diagnosis not present

## 2022-09-02 DIAGNOSIS — M6281 Muscle weakness (generalized): Secondary | ICD-10-CM | POA: Diagnosis not present

## 2022-09-02 DIAGNOSIS — Z471 Aftercare following joint replacement surgery: Secondary | ICD-10-CM | POA: Diagnosis not present

## 2022-09-02 DIAGNOSIS — M25562 Pain in left knee: Secondary | ICD-10-CM | POA: Diagnosis not present

## 2022-09-02 DIAGNOSIS — R260 Ataxic gait: Secondary | ICD-10-CM | POA: Diagnosis not present

## 2022-09-04 NOTE — Progress Notes (Unsigned)
Complete Physical  She would prefer to not go on a medication, will prefer close follow up and diet/life style modification. Patient is very motivated and has a good outlook.   Assessment and Plan:  Encounter for general adult medical examination with abnormal findings 1 year Upcoming mammogram 10/2022  Obesity with comorbidity - Has lost 50 pounds in the past 8 months - follow up 3 months for progress monitoring - increase veggies, decrease carbs - long discussion about weight loss, diet, and exercise  Thyroid disease Taking levothyroxine 150 mcg half tblet Tues & Thurs, whole tablet five days a week. Reminder to take on an empty stomach 30-21mins before first meal of the day. No antacid medications for 4 hours. -     TSH   Other abnormal glucose Discussed dietary and exercise modifications -     Hemoglobin A1c  Essential hypertension Continue current medications: olmesartan 20 mg daily Monitor blood pressure at home; call if consistently over 130/80 Continue DASH diet.   Reminder to go to the ER if any CP, SOB, nausea, dizziness, severe HA, changes vision/speech, left arm numbness and tingling and jaw pain. -     CBC with Differential/Platelet -     COMPLETE METABOLIC PANEL WITH GFR -     TSH -     EKG 12-Lead - continue medications, DASH diet, exercise and monitor at home. Call if greater than 130/80.   Hyperlipidemia, unspecified hyperlipidemia type -     Lipid panel -     EKG 12-Lead check lipids decrease fatty foods increase activity.   Elevated Hematocrit - CBC - Can do blood donation to keep in normal range - Will send to hematology for further evaluation if HCT over 50  Depression, major, recurrent, in partial remission (HCC) Continue medications and exercise  Medication management -     Magnesium  Vitamin D deficiency -Continue supplementation to maintain goal of 70-Jenny Taking Vitamin D 5,000 IU daily -Vitamin D  S/P total left knee  arthroplasty Continue PT Follow with ortho  Seborrheic dermatitis Continue use of Triamcinolone as needed  Vertigo -     diazepam (VALIUM) 5 MG tablet; 1/2-1 pill every 8 hours for dizziness/vertigo - has long history, uses valium PRN, normal neuro Can do vestibular therapy  Screening for blood or protein in urine - Routine UA with reflex microscopic - Microalbumin/creatinine urine ratio  Screening for ischemic heart disease - EKG  Screening for AAA - U/S Abd Retroperitoneal LTD  Further disposition pending results if labs check today. Discussed med's effects and SE's.   Over 30 minutes of face to face interview, exam, counseling, chart review, and critical decision making was performed.   Future Appointments  Date Time Provider [place]  09/08/2023  2:00 PM Alycia Rossetti, NP [facility] None     HPI [name]  presents for a complete physical. has Thyroid disease; Morbid obesity (Jenny Arroyo); Other abnormal glucose; Hyperlipidemia; Hypertension; and Depression, major, recurrent, in partial remission (Jenny Arroyo) on their problem list.    She has history of vertigo, will use valium very infrequently for this. Vertigo only with turning over in bed to the left.  She has tinnitus but no hearing loss. She is wanting to taper off of her Wellbutrin.   Her blood pressure has been controlled at home, she is on Olmesartan 5 mg 2 tab QD their BP is BP: 124/78  BP Readings from Last 3 Encounters:  09/05/22 124/78  03/12/22 112/78  02/14/22 (!) 161/89  She does workout. She denies chest pain, shortness of breath, dizziness.   She could not tolerate contrave due to constipation. She can not tolerate phentermine.  She is on wellbutrin everyday to help with weight.  She is walking her dog 30 minutes everyday.  She has been going to the pool at [employer]- 45 minutes doing water aerobics BMI is Body mass index is 35.68 kg/m., she has been working on diet and exercise.She is down 50  pounds in the last 8 months. She is intermittent fasting Wt Readings from Last 3 Encounters:  09/05/22 231 lb 3.2 oz (104.9 kg)  03/12/22 261 lb 12.8 oz (118.8 kg)  12/25/21 281 lb 3.2 oz (127.6 kg)   She had RTK with Dr. Deanne Coffer 2019 in [place]. She had LTK on 06/20/22 with Dr. Richardo Priest. She is still finishing her PT.   She is not on cholesterol medication, was prescribed Rosuvastatin 5 mg 03/2022 but wanted to try diet and exercise before starting medication. Her cholesterol is at goal. Red meat rarely, some dairy, eating a lot of eggs. The cholesterol last visit was:   Lab Results  Component Value Date   CHOL 226 (H) 03/12/2022   HDL 47 (L) 03/12/2022   LDLCALC 150 (H) 03/12/2022   TRIG 158 (H) 03/12/2022   CHOLHDL 4.8 03/12/2022   Last A1C in the office was:  Lab Results  Component Value Date   HGBA1C 5.3 03/12/2022   Patient is on Vitamin D supplement, 10,000.   Lab Results  Component Value Date   VD25OH 62 08/09/2021    She is on thyroid medication. Her medication was not changed last visit. She is on 1/2 tablet Tues, Thursday and other days 1 tablet, no biotin supplement. Patient denies heat / cold intolerance, nervousness and palpitations.  Lab Results  Component Value Date   TSH 0.58 03/12/2022  .  Current Medications:  Current Outpatient Medications on File Prior to Visit  Medication Sig Dispense Refill   aspirin 81 MG chewable tablet Chew by mouth daily.     buPROPion (WELLBUTRIN XL) 150 MG 24 hr tablet Take 1 tablet by mouth every morning. 30 tablet 1   Cholecalciferol (VITAMIN D PO) Take 5,000 Int'l Units by mouth daily.     levothyroxine (SYNTHROID) 150 MCG tablet Take 1/2 tablet by mouth daily on Tuesday and Thursday, then, take 1 tablet by mouth daily on all other days. Take on an empty stomach with only water for 30 minutes, no antacid, calcium or magnesium medications for 4 hours and avoid biotin. 90 tablet 2   Nutritional Supplements (JUICE PLUS FIBRE  PO) Take by mouth. 3 Fruit Veggies Reds     olmesartan (BENICAR) 5 MG tablet Take 2 tablets (10 mg total) by mouth daily. 180 tablet 3   triamcinolone ointment (KENALOG) 0.1 % Apply 1 Application topically 2 (two) times daily. 80 g 1   diazepam (VALIUM) 5 MG tablet 1/2-1 pill every 8 hours for dizziness/vertigo (Patient not taking: Reported on 09/05/2022) 30 tablet 0   Omega-3 Fatty Acids (FISH OIL) 1200 MG CPDR Take by mouth. (Patient not taking: Reported on 09/05/2022)     rosuvastatin (CRESTOR) 5 MG tablet Take 1 tablet (5 mg total) by mouth daily. (Patient not taking: Reported on 09/05/2022) 30 tablet 11   No current facility-administered medications on file prior to visit.   Health Maintenance:   Immunization History  Administered Date(s) Administered   Influenza Split 05/10/2014   PFIZER(Purple Top)SARS-COV-2 Vaccination 09/20/2019,  10/11/2019   Td 06/11/2007   Tdap 06/18/2018   Health Maintenance  Topic Date Due   HIV Screening  Never done   Hepatitis C Screening  Never done   Zoster Vaccines- Shingrix (1 of 2) Never done   COVID-19 Vaccine (3 - Pfizer risk series) 11/08/2019   INFLUENZA VACCINE  01/08/2022   MAMMOGRAM  10/31/2023   PAP SMEAR-Modifier  08/09/2024   COLONOSCOPY (Pts 45-42yrs Insurance coverage will need to be confirmed)  09/21/2025   DTaP/Tdap/Td (3 - Td or Tdap) 06/18/2028   HPV VACCINES  Aged Out     Medical History:  Past Medical History:  Diagnosis Date   Chronic kidney disease    kidney stones    Depression    mild on Bupropion    Hypertension    PONV (postoperative nausea and vomiting)    Thyroid disease    Allergies Allergies  Allergen Reactions   Augmentin [Amoxicillin-Pot Clavulanate] Nausea And Vomiting   SURGICAL HISTORY She  has a past surgical history that includes Knee arthroscopy (Right, 2015); Replacement total knee (Right, 04/2018); Tonsillectomy; Appendectomy; Cholecystectomy; Ectopic pregnancy surgery; Cesarean section; and  Lithotripsy. FAMILY HISTORY Her family history includes Alzheimer's disease (age of onset: 75) in her maternal grandfather; Alzheimer's disease (age of onset: 29) in her mother; Arthritis in her father; Cancer (age of onset: 39) in her father; Colon cancer (age of onset: 45) in her father; Depression in her brother; Gout in her father; Hypertension in her father and mother; Mental illness in her daughter; Pancreatic cancer in her father. SOCIAL HISTORY She  reports that she has never smoked. She has never used smokeless tobacco. She reports that she does not drink alcohol and does not use drugs.  Review of Systems  Constitutional: Negative.  Negative for chills and fever.  HENT:  Positive for tinnitus. Negative for congestion, hearing loss, sinus pain and sore throat.   Eyes: Negative.  Negative for blurred vision and double vision.  Respiratory: Negative.  Negative for cough, hemoptysis, sputum production, shortness of breath and wheezing.   Cardiovascular: Negative.  Negative for chest pain, palpitations and leg swelling.  Gastrointestinal:  Negative for abdominal pain, constipation, diarrhea, heartburn, nausea and vomiting.  Genitourinary: Negative.  Negative for dysuria and urgency.  Musculoskeletal:  Positive for joint pain (Left knee RECENT SURGERY). Negative for back pain, falls, myalgias and neck pain.  Skin: Negative.  Negative for rash.  Neurological:  Positive for dizziness (intermittent vertigo). Negative for tingling, tremors, weakness and headaches.  Endo/Heme/Allergies:  Does not bruise/bleed easily.  Psychiatric/Behavioral: Negative.  Negative for depression and suicidal ideas. The patient is not nervous/anxious and does not have insomnia.     Physical Exam: Estimated body mass index is 35.68 kg/m as calculated from the following:   Height as of this encounter: 5' 7.5" (1.715 m).   Weight as of this encounter: 231 lb 3.2 oz (104.9 kg). BP 124/78   Pulse 75   Temp 97.7 F  (36.5 C)   Ht 5' 7.5" (1.715 m)   Wt 231 lb 3.2 oz (104.9 kg)   [name] 10/24/2017   SpO2 97%   BMI 35.68 kg/m  General Appearance: Well nourished, in no apparent distress. Eyes: PERRLA, EOMs, conjunctiva no swelling or erythema, normal fundi and vessels. Sinuses: No Frontal/maxillary tenderness ENT/Mouth: Ext aud canals clear, normal light reflex with TMs without erythema, bulging.  Good dentition. No erythema, swelling, or exudate on post pharynx. Tonsils not swollen or erythematous. Hearing normal.  Neck:  Supple, thyroid normal. No bruits Respiratory: Respiratory effort normal, BS equal bilaterally without rales, rhonchi, wheezing or stridor. Breasts: defer , mammogram scheduled Cardio: RRR without murmurs, rubs or gallops. Brisk peripheral pulses without edema.  Chest: symmetric, with normal excursions and percussion. Abdomen: Soft, +BS. Non tender, no guarding, rebound, hernias, masses, or organomegaly.  Lymphatics: Non tender without lymphadenopathy.  Musculoskeletal: Full ROM all peripheral extremities,5/5 strength, and normal gait. Skin: Warm, dry without rashes, lesions, ecchymosis. Actinic keratosis noted to left chest. Neuro: Cranial nerves intact, reflexes equal bilaterally. Normal muscle tone, no cerebellar symptoms. Sensation intact.  Psych: Awake and oriented X 3, normal affect, Insight and Judgment appropriate.    EKG: NSR, no ST changes AAA: < 3 cm  [facility]  09/05/2022

## 2022-09-05 ENCOUNTER — Ambulatory Visit (INDEPENDENT_AMBULATORY_CARE_PROVIDER_SITE_OTHER): Payer: 59 | Admitting: Nurse Practitioner

## 2022-09-05 ENCOUNTER — Encounter: Payer: Self-pay | Admitting: Nurse Practitioner

## 2022-09-05 VITALS — BP 124/78 | HR 75 | Temp 97.7°F | Ht 67.5 in | Wt 231.2 lb

## 2022-09-05 DIAGNOSIS — I700 Atherosclerosis of aorta: Secondary | ICD-10-CM

## 2022-09-05 DIAGNOSIS — Z0001 Encounter for general adult medical examination with abnormal findings: Secondary | ICD-10-CM

## 2022-09-05 DIAGNOSIS — M6281 Muscle weakness (generalized): Secondary | ICD-10-CM | POA: Diagnosis not present

## 2022-09-05 DIAGNOSIS — Z6835 Body mass index (BMI) 35.0-35.9, adult: Secondary | ICD-10-CM

## 2022-09-05 DIAGNOSIS — Z0000 Encounter for general adult medical examination without abnormal findings: Secondary | ICD-10-CM | POA: Diagnosis not present

## 2022-09-05 DIAGNOSIS — Z136 Encounter for screening for cardiovascular disorders: Secondary | ICD-10-CM | POA: Diagnosis not present

## 2022-09-05 DIAGNOSIS — R260 Ataxic gait: Secondary | ICD-10-CM | POA: Diagnosis not present

## 2022-09-05 DIAGNOSIS — I10 Essential (primary) hypertension: Secondary | ICD-10-CM

## 2022-09-05 DIAGNOSIS — E669 Obesity, unspecified: Secondary | ICD-10-CM

## 2022-09-05 DIAGNOSIS — R7989 Other specified abnormal findings of blood chemistry: Secondary | ICD-10-CM

## 2022-09-05 DIAGNOSIS — E079 Disorder of thyroid, unspecified: Secondary | ICD-10-CM

## 2022-09-05 DIAGNOSIS — R7309 Other abnormal glucose: Secondary | ICD-10-CM

## 2022-09-05 DIAGNOSIS — F3341 Major depressive disorder, recurrent, in partial remission: Secondary | ICD-10-CM

## 2022-09-05 DIAGNOSIS — Z1389 Encounter for screening for other disorder: Secondary | ICD-10-CM

## 2022-09-05 DIAGNOSIS — L219 Seborrheic dermatitis, unspecified: Secondary | ICD-10-CM

## 2022-09-05 DIAGNOSIS — Z79899 Other long term (current) drug therapy: Secondary | ICD-10-CM

## 2022-09-05 DIAGNOSIS — E785 Hyperlipidemia, unspecified: Secondary | ICD-10-CM

## 2022-09-05 DIAGNOSIS — E559 Vitamin D deficiency, unspecified: Secondary | ICD-10-CM

## 2022-09-05 DIAGNOSIS — Z471 Aftercare following joint replacement surgery: Secondary | ICD-10-CM | POA: Diagnosis not present

## 2022-09-05 DIAGNOSIS — M25562 Pain in left knee: Secondary | ICD-10-CM | POA: Diagnosis not present

## 2022-09-05 DIAGNOSIS — Z96652 Presence of left artificial knee joint: Secondary | ICD-10-CM

## 2022-09-05 MED ORDER — TRIAMCINOLONE ACETONIDE 0.1 % EX OINT: 1.0000 | TOPICAL_OINTMENT | Freq: Two times a day (BID) | CUTANEOUS | 1 refills | Status: DC

## 2022-09-05 NOTE — Patient Instructions (Signed)

## 2022-09-06 ENCOUNTER — Encounter: Payer: Self-pay | Admitting: Nurse Practitioner

## 2022-09-06 LAB — CBC WITH DIFFERENTIAL/PLATELET
Absolute Monocytes: 585 cells/uL (ref 200–950)
Basophils Absolute: 43 cells/uL (ref 0–200)
Basophils Relative: 0.5 %
Eosinophils Absolute: 249 cells/uL (ref 15–500)
Eosinophils Relative: 2.9 %
HCT: 45.5 % — ABNORMAL HIGH (ref 35.0–45.0)
Hemoglobin: 15.2 g/dL (ref 11.7–15.5)
Lymphs Abs: 2038 cells/uL (ref 850–3900)
MCH: 30.1 pg (ref 27.0–33.0)
MCHC: 33.4 g/dL (ref 32.0–36.0)
MCV: 90.1 fL (ref 80.0–100.0)
MPV: 11 fL (ref 7.5–12.5)
Monocytes Relative: 6.8 %
Neutro Abs: 5685 cells/uL (ref 1500–7800)
Neutrophils Relative %: 66.1 %
Platelets: 285 10*3/uL (ref 140–400)
RBC: 5.05 10*6/uL (ref 3.80–5.10)
RDW: 13.3 % (ref 11.0–15.0)
Total Lymphocyte: 23.7 %
WBC: 8.6 10*3/uL (ref 3.8–10.8)

## 2022-09-06 LAB — URINALYSIS, ROUTINE W REFLEX MICROSCOPIC
Bacteria, UA: NONE SEEN /HPF
Bilirubin Urine: NEGATIVE
Glucose, UA: NEGATIVE
Hyaline Cast: NONE SEEN /LPF
Ketones, ur: NEGATIVE
Nitrite: NEGATIVE
Protein, ur: NEGATIVE
RBC / HPF: NONE SEEN /HPF (ref 0–2)
Specific Gravity, Urine: 1.008 (ref 1.001–1.035)
WBC, UA: >=60 /HPF — AB (ref 0–5)
pH: 5.5 (ref 5.0–8.0)

## 2022-09-06 LAB — HEMOGLOBIN A1C
Hgb A1c MFr Bld: 5.4 % of total Hgb (ref <5.7)
Mean Plasma Glucose: 108 mg/dL
eAG (mmol/L): 6 mmol/L

## 2022-09-06 LAB — LIPID PANEL
Cholesterol: 230 mg/dL — ABNORMAL HIGH (ref <200)
HDL: 53 mg/dL (ref >=50)
LDL Cholesterol (Calc): 154 mg/dL (calc) — ABNORMAL HIGH
Non-HDL Cholesterol (Calc): 177 mg/dL (calc) — ABNORMAL HIGH (ref <130)
Total CHOL/HDL Ratio: 4.3 (calc) (ref <5.0)
Triglycerides: 113 mg/dL (ref <150)

## 2022-09-06 LAB — COMPLETE METABOLIC PANEL WITH GFR
AG Ratio: 2.1 (calc) (ref 1.0–2.5)
ALT: 30 U/L — ABNORMAL HIGH (ref 6–29)
AST: 26 U/L (ref 10–35)
Albumin: 4.5 g/dL (ref 3.6–5.1)
Alkaline phosphatase (APISO): 101 U/L (ref 37–153)
BUN: 10 mg/dL (ref 7–25)
CO2: 25 mmol/L (ref 20–32)
Calcium: 10.3 mg/dL (ref 8.6–10.4)
Chloride: 103 mmol/L (ref 98–110)
Creat: 0.7 mg/dL (ref 0.50–1.05)
Globulin: 2.1 g/dL (calc) (ref 1.9–3.7)
Glucose, Bld: 89 mg/dL (ref 65–99)
Potassium: 4.4 mmol/L (ref 3.5–5.3)
Sodium: 140 mmol/L (ref 135–146)
Total Bilirubin: 0.5 mg/dL (ref 0.2–1.2)
Total Protein: 6.6 g/dL (ref 6.1–8.1)
eGFR: 99 mL/min/{1.73_m2} (ref >=60)

## 2022-09-06 LAB — MICROALBUMIN / CREATININE URINE RATIO
Creatinine, Urine: 64 mg/dL (ref 20–275)
Microalb Creat Ratio: 20 mg/g creat (ref <30)
Microalb, Ur: 1.3 mg/dL

## 2022-09-06 LAB — MAGNESIUM: Magnesium: 1.9 mg/dL (ref 1.5–2.5)

## 2022-09-06 LAB — VITAMIN D 25 HYDROXY (VIT D DEFICIENCY, FRACTURES): Vit D, 25-Hydroxy: 85 ng/mL (ref 30–100)

## 2022-09-06 LAB — TSH: TSH: 0.57 mIU/L (ref 0.40–4.50)

## 2022-09-06 LAB — MICROSCOPIC MESSAGE

## 2022-09-09 DIAGNOSIS — M25562 Pain in left knee: Secondary | ICD-10-CM | POA: Diagnosis not present

## 2022-09-09 DIAGNOSIS — M6281 Muscle weakness (generalized): Secondary | ICD-10-CM | POA: Diagnosis not present

## 2022-09-09 DIAGNOSIS — Z471 Aftercare following joint replacement surgery: Secondary | ICD-10-CM | POA: Diagnosis not present

## 2022-09-09 DIAGNOSIS — R260 Ataxic gait: Secondary | ICD-10-CM | POA: Diagnosis not present

## 2022-09-12 DIAGNOSIS — M25562 Pain in left knee: Secondary | ICD-10-CM | POA: Diagnosis not present

## 2022-09-12 DIAGNOSIS — Z471 Aftercare following joint replacement surgery: Secondary | ICD-10-CM | POA: Diagnosis not present

## 2022-09-12 DIAGNOSIS — M6281 Muscle weakness (generalized): Secondary | ICD-10-CM | POA: Diagnosis not present

## 2022-09-12 DIAGNOSIS — R260 Ataxic gait: Secondary | ICD-10-CM | POA: Diagnosis not present

## 2022-09-16 ENCOUNTER — Other Ambulatory Visit: Payer: Self-pay | Admitting: Internal Medicine

## 2022-09-16 DIAGNOSIS — R260 Ataxic gait: Secondary | ICD-10-CM | POA: Diagnosis not present

## 2022-09-16 DIAGNOSIS — Z1231 Encounter for screening mammogram for malignant neoplasm of breast: Secondary | ICD-10-CM

## 2022-09-16 DIAGNOSIS — Z471 Aftercare following joint replacement surgery: Secondary | ICD-10-CM | POA: Diagnosis not present

## 2022-09-16 DIAGNOSIS — M6281 Muscle weakness (generalized): Secondary | ICD-10-CM | POA: Diagnosis not present

## 2022-09-16 DIAGNOSIS — M25562 Pain in left knee: Secondary | ICD-10-CM | POA: Diagnosis not present

## 2022-09-19 DIAGNOSIS — M25562 Pain in left knee: Secondary | ICD-10-CM | POA: Diagnosis not present

## 2022-09-19 DIAGNOSIS — M6281 Muscle weakness (generalized): Secondary | ICD-10-CM | POA: Diagnosis not present

## 2022-09-19 DIAGNOSIS — R260 Ataxic gait: Secondary | ICD-10-CM | POA: Diagnosis not present

## 2022-09-19 DIAGNOSIS — Z471 Aftercare following joint replacement surgery: Secondary | ICD-10-CM | POA: Diagnosis not present

## 2022-09-26 DIAGNOSIS — M25562 Pain in left knee: Secondary | ICD-10-CM | POA: Diagnosis not present

## 2022-09-26 DIAGNOSIS — R260 Ataxic gait: Secondary | ICD-10-CM | POA: Diagnosis not present

## 2022-09-26 DIAGNOSIS — M6281 Muscle weakness (generalized): Secondary | ICD-10-CM | POA: Diagnosis not present

## 2022-09-26 DIAGNOSIS — Z471 Aftercare following joint replacement surgery: Secondary | ICD-10-CM | POA: Diagnosis not present

## 2022-10-03 DIAGNOSIS — Z471 Aftercare following joint replacement surgery: Secondary | ICD-10-CM | POA: Diagnosis not present

## 2022-10-03 DIAGNOSIS — R260 Ataxic gait: Secondary | ICD-10-CM | POA: Diagnosis not present

## 2022-10-03 DIAGNOSIS — M25562 Pain in left knee: Secondary | ICD-10-CM | POA: Diagnosis not present

## 2022-10-03 DIAGNOSIS — M6281 Muscle weakness (generalized): Secondary | ICD-10-CM | POA: Diagnosis not present

## 2022-10-21 ENCOUNTER — Other Ambulatory Visit: Payer: Self-pay | Admitting: Nurse Practitioner

## 2022-10-21 DIAGNOSIS — L219 Seborrheic dermatitis, unspecified: Secondary | ICD-10-CM

## 2022-11-05 ENCOUNTER — Ambulatory Visit: Payer: 59

## 2022-11-08 ENCOUNTER — Ambulatory Visit
Admission: RE | Admit: 2022-11-08 | Discharge: 2022-11-08 | Disposition: A | Discharge status: Home / Self Care | Payer: 59 | Source: Ambulatory Visit | Attending: Internal Medicine | Admitting: Internal Medicine

## 2022-11-08 DIAGNOSIS — Z1231 Encounter for screening mammogram for malignant neoplasm of breast: Secondary | ICD-10-CM

## 2022-11-18 NOTE — Progress Notes (Unsigned)
Assessment and Plan:  There are no diagnoses linked to this encounter.    Further disposition pending results of labs. Discussed med's effects and SE's.   Over 30 minutes of exam, counseling, chart review, and critical decision making was performed.   Future Appointments  Date Time Provider Department Center  11/19/2022  2:30 PM Jenny Dick, NP [facility] None  02/25/2023  9:30 AM Jenny Dick, NP [facility] None  09/08/2023  2:00 PM Jenny Dick, NP [facility] None    ------------------------------------------------------------------------------------------------------------------   HPI [name] 10/24/2017  62 y.o.female presents for  Past Medical History:  Diagnosis Date   Chronic kidney disease    kidney stones    Depression    mild on Bupropion    Hypertension    PONV (postoperative nausea and vomiting)    Thyroid disease      Allergies  Allergen Reactions   Augmentin [Amoxicillin-Pot Clavulanate] Nausea And Vomiting    Current Outpatient Medications on File Prior to Visit  Medication Sig   aspirin 81 MG chewable tablet Chew by mouth daily.   buPROPion (WELLBUTRIN XL) 150 MG 24 hr tablet Take 1 tablet by mouth every morning.   Cholecalciferol (VITAMIN D PO) Take 5,000 Int'l Units by mouth daily.   diazepam (VALIUM) 5 MG tablet 1/2-1 pill every 8 hours for dizziness/vertigo (Patient not taking: Reported on 09/05/2022)   levothyroxine (SYNTHROID) 150 MCG tablet Take 1/2 tablet by mouth daily on Tuesday and Thursday, then, take 1 tablet by mouth daily on all other days. Take on an empty stomach with only water for 30 minutes, no antacid, calcium or magnesium medications for 4 hours and avoid biotin.   Nutritional Supplements (JUICE PLUS FIBRE PO) Take by mouth. 3 Fruit Veggies Reds   olmesartan (BENICAR) 5 MG tablet Take 2 tablets (10 mg total) by mouth daily.   triamcinolone ointment (KENALOG) 0.1 % Apply 1 application topically twice daily.   No current  facility-administered medications on file prior to visit.    ROS: all negative except above.   Physical Exam:  [name] 10/24/2017   General Appearance: Well nourished, in no apparent distress. Eyes: PERRLA, EOMs, conjunctiva no swelling or erythema Sinuses: No Frontal/maxillary tenderness ENT/Mouth: Ext aud canals clear, TMs without erythema, bulging. No erythema, swelling, or exudate on post pharynx.  Tonsils not swollen or erythematous. Hearing normal.  Neck: Supple, thyroid normal.  Respiratory: Respiratory effort normal, BS equal bilaterally without rales, rhonchi, wheezing or stridor.  Cardio: RRR with no MRGs. Brisk peripheral pulses without edema.  Abdomen: Soft, + BS.  Non tender, no guarding, rebound, hernias, masses. Lymphatics: Non tender without lymphadenopathy.  Musculoskeletal: Full ROM, 5/5 strength, normal gait.  Skin: Warm, dry without rashes, lesions, ecchymosis.  Neuro: Cranial nerves intact. Normal muscle tone, no cerebellar symptoms. Sensation intact.  Psych: Awake and oriented X 3, normal affect, Insight and Judgment appropriate.     Jenny Dick, NP 12:33 PM [facility]

## 2022-11-19 ENCOUNTER — Encounter: Payer: Self-pay | Admitting: Nurse Practitioner

## 2022-11-19 ENCOUNTER — Ambulatory Visit (INDEPENDENT_AMBULATORY_CARE_PROVIDER_SITE_OTHER): Payer: 59 | Admitting: Nurse Practitioner

## 2022-11-19 VITALS — BP 124/80 | HR 61 | Temp 97.5°F | Ht 67.5 in | Wt 222.6 lb

## 2022-11-19 DIAGNOSIS — I10 Essential (primary) hypertension: Secondary | ICD-10-CM

## 2022-11-19 DIAGNOSIS — L989 Disorder of the skin and subcutaneous tissue, unspecified: Secondary | ICD-10-CM

## 2022-11-19 MED ORDER — CLOTRIMAZOLE-BETAMETHASONE 1-0.05 % EX CREA: 1.0000 | TOPICAL_CREAM | Freq: Two times a day (BID) | CUTANEOUS | 2 refills | Status: DC

## 2022-11-19 NOTE — Patient Instructions (Signed)
Use Lotrisone sparingly BID for 7 days to area under left arm Dermatology should be calling with appointment  GENERAL HEALTH GOALS   Know what a healthy weight is for you (roughly BMI <25) and aim to maintain this   Aim for 7+ servings of fruits and vegetables daily   70-80+ fluid ounces of water or unsweet tea for healthy kidneys   Limit to max 1 drink of alcohol per day; avoid smoking/tobacco   Limit animal fats in diet for cholesterol and heart health - choose grass fed whenever available   Avoid highly processed foods, and foods high in saturated/trans fats   Aim for low stress - take time to unwind and care for your mental health   Aim for 150 min of moderate intensity exercise weekly for heart health, and weights twice weekly for bone health   Aim for 7-9 hours of sleep daily

## 2022-12-02 ENCOUNTER — Encounter: Payer: Self-pay | Admitting: Nurse Practitioner

## 2022-12-02 NOTE — Telephone Encounter (Signed)
Can you check on her referral

## 2022-12-04 ENCOUNTER — Ambulatory Visit
Admission: RE | Admit: 2022-12-04 | Discharge: 2022-12-04 | Disposition: A | Discharge status: Home / Self Care | Payer: 59 | Source: Ambulatory Visit | Attending: Internal Medicine | Admitting: Internal Medicine

## 2022-12-04 DIAGNOSIS — Z1231 Encounter for screening mammogram for malignant neoplasm of breast: Secondary | ICD-10-CM | POA: Diagnosis not present

## 2022-12-05 DIAGNOSIS — L821 Other seborrheic keratosis: Secondary | ICD-10-CM | POA: Diagnosis not present

## 2022-12-25 ENCOUNTER — Other Ambulatory Visit: Payer: Self-pay | Admitting: Nurse Practitioner

## 2022-12-25 DIAGNOSIS — E079 Disorder of thyroid, unspecified: Secondary | ICD-10-CM

## 2023-01-02 ENCOUNTER — Encounter: Payer: Self-pay | Admitting: Nurse Practitioner

## 2023-02-04 DIAGNOSIS — D225 Melanocytic nevi of trunk: Secondary | ICD-10-CM | POA: Diagnosis not present

## 2023-02-04 DIAGNOSIS — L814 Other melanin hyperpigmentation: Secondary | ICD-10-CM | POA: Diagnosis not present

## 2023-02-04 DIAGNOSIS — L538 Other specified erythematous conditions: Secondary | ICD-10-CM | POA: Diagnosis not present

## 2023-02-04 DIAGNOSIS — L821 Other seborrheic keratosis: Secondary | ICD-10-CM | POA: Diagnosis not present

## 2023-02-04 DIAGNOSIS — Z789 Other specified health status: Secondary | ICD-10-CM | POA: Diagnosis not present

## 2023-02-04 DIAGNOSIS — L820 Inflamed seborrheic keratosis: Secondary | ICD-10-CM | POA: Diagnosis not present

## 2023-02-04 DIAGNOSIS — L218 Other seborrheic dermatitis: Secondary | ICD-10-CM | POA: Diagnosis not present

## 2023-02-04 DIAGNOSIS — L298 Other pruritus: Secondary | ICD-10-CM | POA: Diagnosis not present

## 2023-02-06 DIAGNOSIS — H8110 Benign paroxysmal vertigo, unspecified ear: Secondary | ICD-10-CM | POA: Diagnosis not present

## 2023-02-24 NOTE — Progress Notes (Unsigned)
FOLLOW UP  Assessment and Plan:   Essential Hypertension Bp running 110's/70's.  Will cut Olmesartan to 1/2 tab daily Monitor blood pressure at home; patient to call if consistently greater than 130/80 Continue DASH diet.   Reminder to go to the ER if any CP, SOB, nausea, dizziness, severe HA, changes vision/speech, left arm numbness and tingling and jaw pain. CBC CMP  Hyperlipidemia Currently not at goal;  Continue low cholesterol diet and exercise.  Check lipid panel.   Abnormal glucose Continue diet and exercise.  Check A1C  Obesity with co morbidities Long discussion about weight loss, diet, and exercise Has lost 20 pounds in the past 3 months doing intermittent fasting- doing very well Recommended diet heavy in fruits and veggies and low in animal meats, cheeses, and dairy products, appropriate calorie intake Pt plans to resume water fitness classes at [employer]  Depression Doing well on Wellbutrin, symptoms well controlled  Hypothyroidism TSH Continue Synthroid dosage and will adjust accordingly pending labs  Vitamin D Def At goal at last visit; continue supplementation to maintain goal of 60-100  Elevated LFT Work on diet, exercise and weight loss.  Limit Tylenol and alcohol.   - CMP  Medication Management Continued  Continue diet and meds as discussed. Further disposition pending results of labs. Discussed med's effects and SE's.   Over 30 minutes of exam, counseling, chart review, and critical decision making was performed.   Future Appointments  Date Time Provider Department Center  02/25/2023  9:30 AM Raynelle Dick, NP [facility] None  02/27/2023  8:15 AM Ernestene Kiel T, DPM [facility]  09/08/2023  2:00 PM Raynelle Dick, NP [facility] None    ----------------------------------------------------------------------------------------------------------------------  HPI 62 y.o. female  presents for 6 month follow up on hypertension,  cholesterol, diabetes, weight and vitamin D deficiency.   BMI is There is no height or weight on file to calculate BMI., she has been working on diet and exercise. Has been heavy her entire life, even as a child. Walks approx 30 minutes a day with dogs.She has lost 20 pounds in the past 3 months. She is doing intermittent fasting and limiting carbs. She eats 11 am- 6 pm. Sometimes will do a 24-36 hour fast.  Wt Readings from Last 3 Encounters:  11/19/22 222 lb 9.6 oz (101 kg)  09/05/22 231 lb 3.2 oz (104.9 kg)  03/12/22 261 lb 12.8 oz (118.8 kg)   She does have left knee pain and has been evaluated by orthopedics. Does say they do need to do a knee replacement until she loses more weight. Would like her weight around 255  Her blood pressure has been controlled at home, Bp's running 110's/70's today their BP is   BP Readings from Last 3 Encounters:  11/19/22 124/80  09/05/22 124/78  03/12/22 112/78     She does workout. She denies chest pain, shortness of breath, dizziness.   She is on cholesterol medication . She takes Omega 3 fish oil.  Her cholesterol is not at goal. The cholesterol last visit was:   Lab Results  Component Value Date   CHOL 230 (H) 09/05/2022   HDL 53 09/05/2022   LDLCALC 154 (H) 09/05/2022   TRIG 113 09/05/2022   CHOLHDL 4.3 09/05/2022    She has been working on diet and exercise for prediabetes,  Last A1C in the office was:  Lab Results  Component Value Date   HGBA1C 5.4 09/05/2022   Patient is on Vitamin D supplement.  Lab Results  Component Value Date   VD25OH 10 09/05/2022        Current Medications:  Current Outpatient Medications on File Prior to Visit  Medication Sig   aspirin 81 MG chewable tablet Chew by mouth daily.   Cholecalciferol (VITAMIN D PO) Take 5,000 Int'l Units by mouth daily.   clotrimazole-betamethasone (LOTRISONE) cream Apply 1 Application topically 2 (two) times daily.   diazepam (VALIUM) 5 MG tablet 1/2-1 pill every 8 hours  for dizziness/vertigo   ibuprofen (ADVIL) 200 MG tablet Take by mouth. PRN   levothyroxine (SYNTHROID) 150 MCG tablet Take 1/2 tablet by mouth on Tuesday and Thursday, and, take 1 tablet by mouth on all other days. Take on an empty stomach with only water for 30 minutes. No antacid, calcium or magnesium medication for 4 hours, and avoid biotin.   Nutritional Supplements (JUICE PLUS FIBRE PO) Take by mouth. 3 Fruit Veggies Reds   olmesartan (BENICAR) 5 MG tablet Take 2 tablets (10 mg total) by mouth daily.   PSYLLIUM FIBER PO Take by mouth.   triamcinolone ointment (KENALOG) 0.1 % Apply 1 application topically twice daily.   No current facility-administered medications on file prior to visit.     Allergies:  Allergies  Allergen Reactions   Augmentin [Amoxicillin-Pot Clavulanate] Nausea And Vomiting     Medical History:  Past Medical History:  Diagnosis Date   Chronic kidney disease    kidney stones    Depression    mild on Bupropion    Hypertension    PONV (postoperative nausea and vomiting)    Thyroid disease    Family history- Reviewed and unchanged Social history- Reviewed and unchanged   Review of Systems:  Review of Systems  Constitutional:  Negative for chills and fever.  HENT:  Positive for tinnitus. Negative for congestion, hearing loss, sinus pain and sore throat.   Eyes:  Negative for blurred vision and double vision.  Respiratory:  Negative for cough, hemoptysis, sputum production, shortness of breath and wheezing.   Cardiovascular:  Negative for chest pain, palpitations and leg swelling.  Gastrointestinal:  Negative for abdominal pain, constipation, diarrhea, heartburn, nausea and vomiting.  Genitourinary:  Negative for dysuria and urgency.  Musculoskeletal:  Positive for joint pain (left knee). Negative for back pain, falls, myalgias and neck pain.  Skin:  Negative for rash.  Neurological:  Positive for dizziness (vertigo intermittent) and sensory change  (decreased in feet bilaterally). Negative for tremors, weakness and headaches.  Endo/Heme/Allergies:  Does not bruise/bleed easily.  Psychiatric/Behavioral:  Negative for depression and suicidal ideas. The patient has insomnia (Wakes up throughput night). The patient is not nervous/anxious.       Physical Exam: [name] 10/24/2017  Wt Readings from Last 3 Encounters:  11/19/22 222 lb 9.6 oz (101 kg)  09/05/22 231 lb 3.2 oz (104.9 kg)  03/12/22 261 lb 12.8 oz (118.8 kg)   General Appearance: Well nourished, in no apparent distress. Eyes: PERRLA, EOMs, conjunctiva no swelling or erythema Sinuses: No Frontal/maxillary tenderness ENT/Mouth: Ext aud canals clear, TMs without erythema, bulging. No erythema, swelling, or exudate on post pharynx.  Tonsils not swollen or erythematous. Hearing normal.  Neck: Supple, thyroid normal.  Respiratory: Respiratory effort normal, BS equal bilaterally without rales, rhonchi, wheezing or stridor.  Cardio: RRR with no MRGs. Brisk peripheral pulses without edema.  Abdomen: Soft, + BS.  Non tender, no guarding, rebound, hernias, masses. Lymphatics: Non tender without lymphadenopathy.  Musculoskeletal: Full ROM, 5/5 strength, Normal gait Skin: Warm,  dry without rashes, lesions, ecchymosis.  Neuro: Cranial nerves intact. No cerebellar symptoms. Sensation intact. Psych: Awake and oriented X 3, normal affect, Insight and Judgment appropriate.    Raynelle Dick, NP 1:24 PM [facility],

## 2023-02-25 ENCOUNTER — Encounter: Payer: Self-pay | Admitting: Nurse Practitioner

## 2023-02-25 ENCOUNTER — Ambulatory Visit (INDEPENDENT_AMBULATORY_CARE_PROVIDER_SITE_OTHER): Payer: 59 | Admitting: Nurse Practitioner

## 2023-02-25 VITALS — BP 134/78 | HR 74 | Temp 97.5°F | Ht 67.5 in | Wt 214.0 lb

## 2023-02-25 DIAGNOSIS — E669 Obesity, unspecified: Secondary | ICD-10-CM | POA: Diagnosis not present

## 2023-02-25 DIAGNOSIS — Z23 Encounter for immunization: Secondary | ICD-10-CM

## 2023-02-25 DIAGNOSIS — E785 Hyperlipidemia, unspecified: Secondary | ICD-10-CM

## 2023-02-25 DIAGNOSIS — E039 Hypothyroidism, unspecified: Secondary | ICD-10-CM | POA: Diagnosis not present

## 2023-02-25 DIAGNOSIS — R7309 Other abnormal glucose: Secondary | ICD-10-CM

## 2023-02-25 DIAGNOSIS — Z79899 Other long term (current) drug therapy: Secondary | ICD-10-CM | POA: Diagnosis not present

## 2023-02-25 DIAGNOSIS — E559 Vitamin D deficiency, unspecified: Secondary | ICD-10-CM | POA: Diagnosis not present

## 2023-02-25 DIAGNOSIS — R7989 Other specified abnormal findings of blood chemistry: Secondary | ICD-10-CM | POA: Diagnosis not present

## 2023-02-25 DIAGNOSIS — I10 Essential (primary) hypertension: Secondary | ICD-10-CM | POA: Diagnosis not present

## 2023-02-25 DIAGNOSIS — F3341 Major depressive disorder, recurrent, in partial remission: Secondary | ICD-10-CM

## 2023-02-25 NOTE — Patient Instructions (Signed)

## 2023-02-26 ENCOUNTER — Other Ambulatory Visit: Payer: Self-pay | Admitting: Nurse Practitioner

## 2023-02-26 DIAGNOSIS — E785 Hyperlipidemia, unspecified: Secondary | ICD-10-CM

## 2023-02-26 LAB — CBC WITH DIFFERENTIAL/PLATELET
Absolute Monocytes: 504 {cells}/uL (ref 200–950)
Basophils Absolute: 49 {cells}/uL (ref 0–200)
Basophils Relative: 0.7 %
Eosinophils Absolute: 189 {cells}/uL (ref 15–500)
Eosinophils Relative: 2.7 %
HCT: 47.4 % — ABNORMAL HIGH (ref 35.0–45.0)
Hemoglobin: 16 g/dL — ABNORMAL HIGH (ref 11.7–15.5)
Lymphs Abs: 1708 {cells}/uL (ref 850–3900)
MCH: 30.5 pg (ref 27.0–33.0)
MCHC: 33.8 g/dL (ref 32.0–36.0)
MCV: 90.5 fL (ref 80.0–100.0)
MPV: 11.2 fL (ref 7.5–12.5)
Monocytes Relative: 7.2 %
Neutro Abs: 4550 {cells}/uL (ref 1500–7800)
Neutrophils Relative %: 65 %
Platelets: 240 10*3/uL (ref 140–400)
RBC: 5.24 10*6/uL — ABNORMAL HIGH (ref 3.80–5.10)
RDW: 13.3 % (ref 11.0–15.0)
Total Lymphocyte: 24.4 %
WBC: 7 10*3/uL (ref 3.8–10.8)

## 2023-02-26 MED ORDER — ROSUVASTATIN CALCIUM 5 MG PO TABS: ORAL_TABLET | ORAL | 3 refills | Status: DC

## 2023-02-27 ENCOUNTER — Encounter: Payer: Self-pay | Admitting: Podiatry

## 2023-02-27 ENCOUNTER — Telehealth: Payer: Self-pay | Admitting: Podiatry

## 2023-02-27 ENCOUNTER — Encounter: Payer: Self-pay | Admitting: Nurse Practitioner

## 2023-02-27 ENCOUNTER — Ambulatory Visit (INDEPENDENT_AMBULATORY_CARE_PROVIDER_SITE_OTHER): Payer: 59 | Admitting: Podiatry

## 2023-02-27 DIAGNOSIS — L600 Ingrowing nail: Secondary | ICD-10-CM

## 2023-02-27 DIAGNOSIS — G5793 Unspecified mononeuropathy of bilateral lower limbs: Secondary | ICD-10-CM

## 2023-02-27 DIAGNOSIS — M2142 Flat foot [pes planus] (acquired), left foot: Secondary | ICD-10-CM

## 2023-02-27 DIAGNOSIS — M2141 Flat foot [pes planus] (acquired), right foot: Secondary | ICD-10-CM | POA: Diagnosis not present

## 2023-02-27 MED ORDER — NEOMYCIN-POLYMYXIN-HC 1 % OT SOLN: OTIC | 1 refills | Status: DC

## 2023-02-27 NOTE — Telephone Encounter (Signed)
Cortisporum solution, they do not have or other pharmacies near by.  They do have cortisporum suspension.  Can they be filled?

## 2023-02-27 NOTE — Patient Instructions (Signed)

## 2023-02-27 NOTE — Telephone Encounter (Signed)
Pt has called twice and has sent a mychart message as well she is not able to get the medication you called in filled at the pharmacy it was sent to. She said she is now trying to get it filled at the [facility] on [address]. I did explain that you are in clinic still.

## 2023-02-28 ENCOUNTER — Other Ambulatory Visit: Payer: Self-pay

## 2023-02-28 ENCOUNTER — Telehealth: Payer: Self-pay | Admitting: Podiatry

## 2023-02-28 MED ORDER — NEOMYCIN-POLYMYXIN-HC 1 % OT SOLN: OTIC | 1 refills | Status: DC

## 2023-02-28 NOTE — Telephone Encounter (Signed)
Pt called upset as she was seen yesterday and the antibiotic was not sent in and she has called again today as well and spoke to [facility].She said it was unnerving since she has had a knee replacement and is concerned about infection.  I checked chart and it was sent in and did confirm pharmacy it was sent to for the patient. Pt said thank you!

## 2023-02-28 NOTE — Telephone Encounter (Signed)
I spoke with the patient this morning. [facility] does not have the medication. She requested that we send it to [facility] on [address], which I did. Advised to please call back with any further questions or concerns

## 2023-03-02 NOTE — Progress Notes (Signed)
She presents today after having not seen her for over a year chief plaint of ingrown toenails to the hallux bilateral borders bilaterally.  She also goes on to say that she only wears orthotics which is working out in her feet still can feel numb on the bottom.  Objective: Vital signs stable oriented x 3.  Pulses are palpable bilateral.  Neurologic sensorium is intact Deetjen reflexes are intact muscle strength is normal.  Sharp incurvated nail margins to the tibial-fibular border of the hallux bilateral with onychocryptosis.  There is no cellulitis drainage or odor some mild erythema no purulence.  Assessment: Ingrown toenail tibial and fibular border of the hallux bilateral.  Plan: Discussed etiology pathology conservative surgical therapies, corticectomy was performed today with phenol tolerated procedure well both borders bilateral.  She was given both oral written blood pressure care and soaking of the toes as well as a prescription for Cortisporin Otic to be applied twice daily after soaking follow-up with her in 2 weeks.

## 2023-03-03 ENCOUNTER — Other Ambulatory Visit: Payer: 59

## 2023-03-03 ENCOUNTER — Other Ambulatory Visit: Payer: Self-pay | Admitting: Nurse Practitioner

## 2023-03-03 DIAGNOSIS — R7309 Other abnormal glucose: Secondary | ICD-10-CM

## 2023-03-03 NOTE — Telephone Encounter (Signed)
Please call pt and schedule a lab only visit to check A1c

## 2023-03-04 ENCOUNTER — Other Ambulatory Visit: Payer: 59

## 2023-03-04 DIAGNOSIS — R7309 Other abnormal glucose: Secondary | ICD-10-CM

## 2023-03-05 LAB — HEMOGLOBIN A1C W/OUT EAG: Hgb A1c MFr Bld: 5.3 % of total Hgb (ref <5.7)

## 2023-03-18 ENCOUNTER — Encounter: Payer: Self-pay | Admitting: Nurse Practitioner

## 2023-03-31 ENCOUNTER — Encounter: Payer: Self-pay | Admitting: Nurse Practitioner

## 2023-03-31 ENCOUNTER — Other Ambulatory Visit: Payer: Self-pay | Admitting: Nurse Practitioner

## 2023-03-31 DIAGNOSIS — F3341 Major depressive disorder, recurrent, in partial remission: Secondary | ICD-10-CM

## 2023-03-31 MED ORDER — BUPROPION HCL ER (XL) 150 MG PO TB24: 150.0000 mg | ORAL_TABLET | ORAL | 2 refills | Status: DC

## 2023-04-03 ENCOUNTER — Ambulatory Visit (INDEPENDENT_AMBULATORY_CARE_PROVIDER_SITE_OTHER): Payer: 59 | Admitting: Podiatry

## 2023-04-03 ENCOUNTER — Encounter: Payer: Self-pay | Admitting: Podiatry

## 2023-04-03 DIAGNOSIS — L600 Ingrowing nail: Secondary | ICD-10-CM

## 2023-04-03 NOTE — Progress Notes (Signed)
Subjective:   Patient ID: Jenny Arroyo, female   DOB: 62 y.o.   MRN: [id number]   HPI Patient presents stating that she was concerned about her toenails after surgery last month by Dr. Al Corpus that there is still some crusted tissue slight redness and that she has had knee replacement   ROS      Objective:  Physical Exam  Neurovascular status intact crusted tissue in the hallux nail borders bilateral with patient having no current drainage erythema     Assessment:  History of ingrown toenail relative normal recovery     Plan:  H&P reviewed recommended the continuation of being careful with this and I do think it will heal uneventfully and if any redness drainage were to occur patient is to reappoint

## 2023-04-23 ENCOUNTER — Other Ambulatory Visit: Payer: 59

## 2023-04-24 ENCOUNTER — Encounter: Payer: Self-pay | Admitting: Nurse Practitioner

## 2023-04-24 ENCOUNTER — Ambulatory Visit: Payer: 59 | Admitting: Nurse Practitioner

## 2023-04-24 VITALS — BP 122/78 | HR 69 | Temp 97.5°F | Ht 67.5 in | Wt 216.6 lb

## 2023-04-24 DIAGNOSIS — R1031 Right lower quadrant pain: Secondary | ICD-10-CM

## 2023-04-24 DIAGNOSIS — I10 Essential (primary) hypertension: Secondary | ICD-10-CM

## 2023-04-24 DIAGNOSIS — S46211A Strain of muscle, fascia and tendon of other parts of biceps, right arm, initial encounter: Secondary | ICD-10-CM

## 2023-04-24 MED ORDER — MELOXICAM 7.5 MG PO TABS
7.5000 mg | ORAL_TABLET | Freq: Every day | ORAL | 2 refills | Status: DC
Start: 2023-04-24 — End: 2023-08-21

## 2023-04-24 NOTE — Progress Notes (Signed)
Assessment and Plan:   Jenny "Almira Coaster" was seen today for acute visit.  Diagnoses and all orders for this visit:  Essential hypertension - controlled without medication - Continue DASH diet, exercise and monitor at home. Call if greater than 130/80.    RLQ abdominal pain Will treat further pending U/S results -     US Pelvic Complete With Transvaginal; Future -     meloxicam (MOBIC) 7.5 MG tablet; Take 1 tablet (7.5 mg total) by mouth daily.  Strain of right biceps tendon Rest, heat and Meloxicam daily x 7-10 days If no improvement notify the office         Further disposition pending results of labs. Discussed med's effects and SE's.   Over 30 minutes of exam, counseling, chart review, and critical decision making was performed.          Future Appointments  Date Time Provider Department Center  09/08/2023  2:00 PM Raynelle Dick, NP [facility] None      ------------------------------------------------------------------------------------------------------------------     HPI BP 122/78   Pulse 69   Temp (!) 97.5 F (36.4 C)   Ht 5' 7.5" (1.715 m)   Wt 216 lb 9.6 oz (98.2 kg)   [name] 10/24/2017   SpO2 95%   BMI 33.42 kg/m  62 y.o.female presents for RLQ abdominal pain x 3 weeks. In the area of right lower quadrant. Describes the pain as burning/sharp pain that is constant at 1-2/10 but becomes 5/10 with activity.  Denies change in bowel or bladder function.      Right shoulder pain that radiates down front of arm.  Pain has been for 3-4 weeks and hurts when she lifts something with any weight to it.       BP well controlled without medication    BP Readings from Last 3 Encounters:  04/24/23 122/78  02/25/23 134/78  11/19/22 124/80  Denies headaches, chest pain, shortness of breath and dizziness    BMI is Body mass index is 33.42 kg/m., she has been working on diet and exercise.    Wt Readings from Last 3 Encounters:  04/24/23 216 lb 9.6 oz (98.2 kg)   02/25/23 214 lb (97.1 kg)  11/19/22 222 lb 9.6 oz (101 kg)          Past Medical History:  Diagnosis Date   Chronic kidney disease      kidney stones    Depression      mild on Bupropion    Hypertension     PONV (postoperative nausea and vomiting)     Thyroid disease            Allergies       Allergies  Allergen Reactions   Augmentin [Amoxicillin-Pot Clavulanate] Nausea And Vomiting   Rosuvastatin        Myalgia at lowest dose 3 days a week            Current Outpatient Medications on File Prior to Visit  Medication Sig   APPLE CIDER VINEGAR PO Take by mouth.   aspirin 81 MG chewable tablet Chew by mouth daily.   Berberine Chloride (BERBERINE HCI PO) Take by mouth.   buPROPion (WELLBUTRIN XL) 150 MG 24 hr tablet Take 1 tablet (150 mg total) by mouth every morning.   Cholecalciferol (VITAMIN D PO) Take 5,000 Int'l Units by mouth daily.   clotrimazole-betamethasone (LOTRISONE) cream Apply 1 Application topically 2 (two) times daily.   diazepam (VALIUM) 5 MG tablet 1/2-1 pill every 8  hours for dizziness/vertigo   ibuprofen (ADVIL) 200 MG tablet Take by mouth. PRN   levothyroxine (SYNTHROID) 150 MCG tablet Take 1/2 tablet by mouth on Tuesday and Thursday, and, take 1 tablet by mouth on all other days. Take on an empty stomach with only water for 30 minutes. No antacid, calcium or magnesium medication for 4 hours, and avoid biotin.   MAGNESIUM PO Take by mouth.   NEOMYCIN-POLYMYXIN-HYDROCORTISONE (CORTISPORIN) 1 % SOLN OTIC solution Apply 1-2 drops to toe BID after soaking   Nutritional Supplements (JUICE PLUS FIBRE PO) Take by mouth. 3 Fruit Veggies Reds   PSYLLIUM FIBER PO Take by mouth.   triamcinolone ointment (KENALOG) 0.1 % Apply 1 application topically twice daily.   TURMERIC CURCUMIN PO Take by mouth.      No current facility-administered medications on file prior to visit.        ROS: all negative except above.    Physical Exam:   BP 122/78   Pulse  69   Temp (!) 97.5 F (36.4 C)   Ht 5' 7.5" (1.715 m)   Wt 216 lb 9.6 oz (98.2 kg)   [name] 10/24/2017   SpO2 95%   BMI 33.42 kg/m    General Appearance: Well nourished, in no apparent distress. Eyes: PERRLA, EOMs, conjunctiva no swelling or erythema Sinuses: No Frontal/maxillary tenderness ENT/Mouth: Ext aud canals clear, TMs without erythema, bulging. No erythema, swelling, or exudate on post pharynx.  Tonsils not swollen or erythematous. Hearing normal.  Neck: Supple, thyroid normal.  Respiratory: Respiratory effort normal, BS equal bilaterally without rales, rhonchi, wheezing or stridor.  Cardio: RRR with no MRGs. Brisk peripheral pulses without edema.  Abdomen: Soft, + BS.  RLQ pain with palpation, no mass Lymphatics: Non tender without lymphadenopathy.  Musculoskeletal: Full ROM, 5/5 strength, normal gait. Right upper arm pain proximal bicep tendon, no masses Skin: Warm, dry without rashes, lesions, ecchymosis.  Neuro: Cranial nerves intact. Normal muscle tone, no cerebellar symptoms. Sensation intact.  Psych: Awake and oriented X 3, normal affect, Insight and Judgment appropriate.      Raynelle Dick, NP 2:30 PM [facility]

## 2023-04-24 NOTE — Patient Instructions (Addendum)
YOU CAN CALL TO MAKE AN ULTRASOUND..  I have put in an order for a pelvic ultrasound for you to have You can set them up at your convenience by calling this number [phone] You will likely have the ultrasound at [address] [facility]   If you have any issues call our office and we will set this up for you.    Proximal Biceps Tendon Tear  The proximal biceps tendon is a strong cord of tissue that connects the biceps muscle to the shoulder blade. The biceps muscle is a muscle on the front of the upper arm. A proximal biceps tendon tear can include a partial or complete tear of the tendon near where it connects to the bone of the shoulder. This injury can interfere with your ability to lift your arm in front of your body, stabilize your shoulder, bend your elbow, and turn your hand palm-up. What are the causes? This condition happens when too much force is placed on the tendon. This excess force may be caused by: The elbow being suddenly straightened from a bent position because of an external force. This could happen, for example, while catching a heavy weight or being pulled when waterskiing. Wear and tear from physical activity. Breaking a fall with your hand. What increases the risk? The following factors may make you more likely to develop this condition: Playing contact sports. Doing activities or sports that involve throwing or overhead movements, such as racket sports, gymnastics, or baseball. Doing activities or sports that involve putting sudden force on the arm, such as weight lifting or waterskiing. Having a weakened tendon because of: Long-lasting (chronic) biceps tendinitis. Certain medical conditions, such as diabetes or rheumatoid arthritis. Repeated use of steroids. Repetitive overhead movements. What are the signs or symptoms? Symptoms of this condition may include: Sudden sharp pain in the front of the shoulder. Pain may get worse during certain movements, such  as: Lifting or carrying objects. Straightening the elbow. Throwing or using overhead movements. Swelling or a feeling of unusual warmth on the front of the shoulder. Painful, sudden tightening (spasm) of the biceps muscle. A bulge on the inside of the upper arm when the elbow is bent. Bruising in the shoulder or upper arm. Limited range of motion of the shoulder and elbow. Weakness in the elbow and forearm. How is this diagnosed? This condition may be diagnosed based on: Your symptoms and medical history. A physical exam. Your health care provider may test the strength and range of motion of your shoulder and elbow. Imaging tests, such as X-ray, MRI, or ultrasound. How is this treated? Treatment depends on the severity of the condition. Treatment may include: Medicines to help relieve pain and inflammation. Resting from sports and intense physical activity. Avoiding certain activities that put stress on your shoulder. Physical therapy. Surgery to repair the tear. This may be needed if other treatments have not worked. One or more injections of corticosteroids into your upper arm to help reduce inflammation. This treatment is rare. Follow these instructions at home: Managing pain, stiffness, and swelling     If directed, put ice on the injured area. To do this: Put ice in a plastic bag. Place a towel between your skin and the bag. Leave the ice on for 20 minutes, 2-3 times a day. If directed, apply heat to the affected area before you exercise or as often as told by your health care provider. Use the heat source that your health care provider  recommends, such as a moist heat pack or a heating pad. Place a towel between your skin and the heat source. Leave the heat on for 20-30 minutes. If your skin turns bright red, remove the ice or heat right away to prevent skin damage. The risk of damage is higher if you cannot feel pain, heat, or cold. Move your fingers often to avoid  stiffness and to lessen swelling. Raise (elevate) the injured area while you are sitting or lying down. Activity Avoid activities that cause pain or make your condition worse. You may have to avoid lifting. Ask your health care provider how much you can safely lift. Do exercises as told by your physical therapist or health care provider. Return to your normal activities as told by your health care provider. Ask your health care provider what activities are safe for you. General instructions Take over-the-counter and prescription medicines only as told by your health care provider. Do not use any products that contain nicotine or tobacco. These products include cigarettes, chewing tobacco, and vaping devices, such as e-cigarettes. These can delay healing. If you need help quitting, ask your health care provider. Keep all follow-up visits. Your health care provider will check if your injury is healing. How is this prevented? Take these steps to help prevent reinjury: Warm up and stretch before being active. Cool down and stretch after being active. Give your body time to rest between periods of activity. Make sure you use equipment that fits you. Be safe and responsible while being active. This will help you avoid falls. Maintain physical fitness, including strength and flexibility. Contact a health care provider if: You have symptoms that get worse or do not get better after 2 weeks of treatment. You develop new symptoms. Get help right away if: You have severe pain. You develop pain or numbness in your hand. Your hand feels unusually cold. Your fingernails turn a dark color, such as blue or gray. This information is not intended to replace advice given to you by your health care provider. Make sure you discuss any questions you have with your health care provider. Document Revised: 10/22/2021 Document Reviewed: 10/22/2021 Elsevier Patient Education  2024 [employer].

## 2023-04-24 NOTE — Progress Notes (Signed)
Assessment and Plan:  There are no diagnoses linked to this encounter.    Further disposition pending results of labs. Discussed med's effects and SE's.   Over 30 minutes of exam, counseling, chart review, and critical decision making was performed.   Future Appointments  Date Time Provider Department Center  04/24/2023  2:30 PM Jenny Dick, NP [facility] None  09/08/2023  2:00 PM Jenny Dick, NP [facility] None    ------------------------------------------------------------------------------------------------------------------   HPI [name] 10/24/2017  62 y.o.female presents for  Past Medical History:  Diagnosis Date   Chronic kidney disease    kidney stones    Depression    mild on Bupropion    Hypertension    PONV (postoperative nausea and vomiting)    Thyroid disease      Allergies  Allergen Reactions   Augmentin [Amoxicillin-Pot Clavulanate] Nausea And Vomiting   Rosuvastatin     Myalgia at lowest dose 3 days a week    Current Outpatient Medications on File Prior to Visit  Medication Sig   APPLE CIDER VINEGAR PO Take by mouth.   aspirin 81 MG chewable tablet Chew by mouth daily.   Berberine Chloride (BERBERINE HCI PO) Take by mouth.   buPROPion (WELLBUTRIN XL) 150 MG 24 hr tablet Take 1 tablet (150 mg total) by mouth every morning.   Cholecalciferol (VITAMIN D PO) Take 5,000 Int'l Units by mouth daily.   clotrimazole-betamethasone (LOTRISONE) cream Apply 1 Application topically 2 (two) times daily.   diazepam (VALIUM) 5 MG tablet 1/2-1 pill every 8 hours for dizziness/vertigo   ibuprofen (ADVIL) 200 MG tablet Take by mouth. PRN   levothyroxine (SYNTHROID) 150 MCG tablet Take 1/2 tablet by mouth on Tuesday and Thursday, and, take 1 tablet by mouth on all other days. Take on an empty stomach with only water for 30 minutes. No antacid, calcium or magnesium medication for 4 hours, and avoid biotin.   MAGNESIUM PO Take by mouth.    NEOMYCIN-POLYMYXIN-HYDROCORTISONE (CORTISPORIN) 1 % SOLN OTIC solution Apply 1-2 drops to toe BID after soaking   Nutritional Supplements (JUICE PLUS FIBRE PO) Take by mouth. 3 Fruit Veggies Reds   PSYLLIUM FIBER PO Take by mouth.   triamcinolone ointment (KENALOG) 0.1 % Apply 1 application topically twice daily.   TURMERIC CURCUMIN PO Take by mouth.   No current facility-administered medications on file prior to visit.    ROS: all negative except above.   Physical Exam:  [name] 10/24/2017   General Appearance: Well nourished, in no apparent distress. Eyes: PERRLA, EOMs, conjunctiva no swelling or erythema Sinuses: No Frontal/maxillary tenderness ENT/Mouth: Ext aud canals clear, TMs without erythema, bulging. No erythema, swelling, or exudate on post pharynx.  Tonsils not swollen or erythematous. Hearing normal.  Neck: Supple, thyroid normal.  Respiratory: Respiratory effort normal, BS equal bilaterally without rales, rhonchi, wheezing or stridor.  Cardio: RRR with no MRGs. Brisk peripheral pulses without edema.  Abdomen: Soft, + BS.  Non tender, no guarding, rebound, hernias, masses. Lymphatics: Non tender without lymphadenopathy.  Musculoskeletal: Full ROM, 5/5 strength, normal gait.  Skin: Warm, dry without rashes, lesions, ecchymosis.  Neuro: Cranial nerves intact. Normal muscle tone, no cerebellar symptoms. Sensation intact.  Psych: Awake and oriented X 3, normal affect, Insight and Judgment appropriate.     Jenny Dick, NP 10:02 AM [facility]

## 2023-04-25 ENCOUNTER — Ambulatory Visit
Admission: RE | Admit: 2023-04-25 | Discharge: 2023-04-25 | Disposition: A | Discharge status: Home / Self Care | Payer: 59 | Source: Ambulatory Visit | Attending: Nurse Practitioner | Admitting: Nurse Practitioner

## 2023-04-25 DIAGNOSIS — R1031 Right lower quadrant pain: Secondary | ICD-10-CM

## 2023-04-25 DIAGNOSIS — R102 Pelvic and perineal pain: Secondary | ICD-10-CM | POA: Diagnosis not present

## 2023-05-01 ENCOUNTER — Telehealth: Payer: Self-pay | Admitting: Nurse Practitioner

## 2023-05-01 NOTE — Telephone Encounter (Signed)
Spoke to reading room, requested result of US Pelvis Complete. Requested as Priority.

## 2023-05-26 ENCOUNTER — Other Ambulatory Visit: Payer: Self-pay | Admitting: Nurse Practitioner

## 2023-05-26 DIAGNOSIS — R1031 Right lower quadrant pain: Secondary | ICD-10-CM

## 2023-05-26 DIAGNOSIS — D219 Benign neoplasm of connective and other soft tissue, unspecified: Secondary | ICD-10-CM

## 2023-06-05 ENCOUNTER — Ambulatory Visit
Admission: RE | Admit: 2023-06-05 | Discharge: 2023-06-05 | Disposition: A | Discharge status: Home / Self Care | Payer: 59 | Source: Ambulatory Visit | Attending: Family Medicine | Admitting: Family Medicine

## 2023-06-05 VITALS — BP 141/95 | HR 75 | Temp 98.1°F | Resp 20

## 2023-06-05 DIAGNOSIS — B9689 Other specified bacterial agents as the cause of diseases classified elsewhere: Secondary | ICD-10-CM | POA: Diagnosis not present

## 2023-06-05 DIAGNOSIS — J019 Acute sinusitis, unspecified: Secondary | ICD-10-CM | POA: Diagnosis not present

## 2023-06-05 MED ORDER — PROMETHAZINE-DM 6.25-15 MG/5ML PO SYRP: 5.0000 mL | ORAL_SOLUTION | Freq: Three times a day (TID) | ORAL | 0 refills | Status: DC | PRN

## 2023-06-05 MED ORDER — PSEUDOEPHEDRINE HCL 30 MG PO TABS: 30.0000 mg | ORAL_TABLET | Freq: Three times a day (TID) | ORAL | 0 refills | Status: DC | PRN

## 2023-06-05 MED ORDER — CETIRIZINE HCL 10 MG PO TABS: 10.0000 mg | ORAL_TABLET | Freq: Every day | ORAL | 0 refills | Status: DC

## 2023-06-05 MED ORDER — AMOXICILLIN-POT CLAVULANATE 875-125 MG PO TABS: 1.0000 | ORAL_TABLET | Freq: Two times a day (BID) | ORAL | 0 refills | Status: DC

## 2023-06-05 NOTE — ED Triage Notes (Signed)
Pt c/o dry cough and sore throat and earache-sx x 1 week-report +covid exposure and 2 neg covid home test-NAD-steady gait

## 2023-06-05 NOTE — ED Provider Notes (Signed)
[facility]  Note:  This document was prepared using Conservation officer, historic buildings and may include unintentional dictation errors.  MRN: [id number] DOB: 08-05-1960  Subjective:   Jenny Arroyo is a 62 y.o. female presenting for 8-9 day history of acute onset persistent throat pain, ear pain, sinus fullness, hoarseness, drainage, dry hacking cough.  The cough is intermittently productive.  Had a COVID exposure but has been tested negative.  No overt chest pain, shortness of breath or wheezing.  No history of asthma.  No history of arrhythmias.  No smoking of any kind including cigarettes, cigars, vaping, marijuana use.    No current facility-administered medications for this encounter.  Current Outpatient Medications:    APPLE CIDER VINEGAR PO, Take by mouth., Disp: , Rfl:    aspirin 81 MG chewable tablet, Chew by mouth daily., Disp: , Rfl:    Berberine Chloride (BERBERINE HCI PO), Take by mouth., Disp: , Rfl:    buPROPion (WELLBUTRIN XL) 150 MG 24 hr tablet, Take 1 tablet (150 mg total) by mouth every morning., Disp: 30 tablet, Rfl: 2   Cholecalciferol (VITAMIN D PO), Take 5,000 Int'l Units by mouth daily., Disp: , Rfl:    clotrimazole-betamethasone (LOTRISONE) cream, Apply 1 Application topically 2 (two) times daily., Disp: 15 g, Rfl: 2   diazepam (VALIUM) 5 MG tablet, 1/2-1 pill every 8 hours for dizziness/vertigo, Disp: 30 tablet, Rfl: 0   levothyroxine (SYNTHROID) 150 MCG tablet, Take 1/2 tablet by mouth on Tuesday and Thursday, and, take 1 tablet by mouth on all other days. Take on an empty stomach with only water for 30 minutes. No antacid, calcium or magnesium medication for 4 hours, and avoid biotin., Disp: 90 tablet, Rfl: 1   MAGNESIUM PO, Take by mouth. (Patient not taking: Reported on 04/24/2023), Disp: , Rfl:    meloxicam (MOBIC) 7.5 MG tablet, Take 1 tablet (7.5 mg total) by mouth daily., Disp: 30 tablet, Rfl: 2   Multiple Vitamin (MULTIVITAMIN)  tablet, Take 1 tablet by mouth daily., Disp: , Rfl:    NEOMYCIN-POLYMYXIN-HYDROCORTISONE (CORTISPORIN) 1 % SOLN OTIC solution, Apply 1-2 drops to toe BID after soaking (Patient not taking: Reported on 04/24/2023), Disp: 10 mL, Rfl: 1   Nutritional Supplements (JUICE PLUS FIBRE PO), Take by mouth. 3 Fruit Veggies Reds (Patient not taking: Reported on 04/24/2023), Disp: , Rfl:    PSYLLIUM FIBER PO, Take by mouth., Disp: , Rfl:    triamcinolone ointment (KENALOG) 0.1 %, Apply 1 application topically twice daily., Disp: 80 g, Rfl: 0   TURMERIC CURCUMIN PO, Take by mouth. (Patient not taking: Reported on 04/24/2023), Disp: , Rfl:    Allergies  Allergen Reactions   Augmentin [Amoxicillin-Pot Clavulanate] Nausea And Vomiting   Rosuvastatin     Myalgia at lowest dose 3 days a week    Past Medical History:  Diagnosis Date   Chronic kidney disease    kidney stones    Depression    mild on Bupropion    Hypertension    PONV (postoperative nausea and vomiting)    Thyroid disease      Past Surgical History:  Procedure Laterality Date   APPENDECTOMY     CESAREAN SECTION     x1   CHOLECYSTECTOMY     ECTOPIC PREGNANCY SURGERY     KNEE ARTHROSCOPY Right 2015   LITHOTRIPSY     REPLACEMENT TOTAL KNEE Right 04/2018   TONSILLECTOMY      Family History  Problem Relation Age of  Onset   Hypertension Mother    Alzheimer's disease Mother 55   Cancer Father 22       colon    Hypertension Father    Arthritis Father    Gout Father    Colon cancer Father 34   Pancreatic cancer Father    Depression Brother    Mental illness Daughter    Alzheimer's disease Maternal Grandfather 72   Colon polyps Neg Hx    Rectal cancer Neg Hx    Stomach cancer Neg Hx    Esophageal cancer Neg Hx     Social History   Tobacco Use   Smoking status: Never   Smokeless tobacco: Never  Vaping Use   Vaping status: Never Used  Substance Use Topics   Alcohol use: No   Drug use: No    ROS   Objective:    Vitals: BP (!) 141/95 (BP Location: Left Arm)   Pulse 75   Temp 98.1 F (36.7 C) (Oral)   Resp 20   [name] 10/24/2017   SpO2 94%   Physical Exam Constitutional:      General: She is not in acute distress.    Appearance: Normal appearance. She is well-developed and normal weight. She is not ill-appearing, toxic-appearing or diaphoretic.  HENT:     Head: Normocephalic and atraumatic.     Right Ear: Tympanic membrane, ear canal and external ear normal. No drainage or tenderness. No middle ear effusion. There is no impacted cerumen. Tympanic membrane is not erythematous or bulging.     Left Ear: Tympanic membrane, ear canal and external ear normal. No drainage or tenderness.  No middle ear effusion. There is no impacted cerumen. Tympanic membrane is not erythematous or bulging.     Nose: Congestion and rhinorrhea present.     Mouth/Throat:     Mouth: Mucous membranes are moist. No oral lesions.     Pharynx: Posterior oropharyngeal erythema present. No pharyngeal swelling, oropharyngeal exudate or uvula swelling.     Tonsils: No tonsillar exudate or tonsillar abscesses.  Eyes:     General: No scleral icterus.       Right eye: No discharge.        Left eye: No discharge.     Extraocular Movements: Extraocular movements intact.     Right eye: Normal extraocular motion.     Left eye: Normal extraocular motion.     Conjunctiva/sclera: Conjunctivae normal.  Cardiovascular:     Rate and Rhythm: Normal rate and regular rhythm.     Heart sounds: Normal heart sounds. No murmur heard.    No friction rub. No gallop.  Pulmonary:     Effort: Pulmonary effort is normal. No respiratory distress.     Breath sounds: No stridor. No wheezing, rhonchi or rales.  Chest:     Chest wall: No tenderness.  Musculoskeletal:     Cervical back: Normal range of motion and neck supple.  Lymphadenopathy:     Cervical: No cervical adenopathy.  Skin:    General: Skin is warm and dry.  Neurological:      General: No focal deficit present.     Mental Status: She is alert and oriented to person, place, and time.  Psychiatric:        Mood and Affect: Mood normal.        Behavior: Behavior normal.     Assessment and Plan :   PDMP not reviewed this encounter.  1. Acute bacterial sinusitis    Will start  empiric treatment for sinusitis with Augmentin.  Recommended supportive care otherwise. Deferred imaging given clear cardiopulmonary exam, hemodynamically stable vital signs. Counseled patient on potential for adverse effects with medications prescribed/recommended today, ER and return-to-clinic precautions discussed, patient verbalized understanding.    Wallis Bamberg, [place] 06/05/23 [phone]

## 2023-06-05 NOTE — Discharge Instructions (Addendum)
We will manage this as a sinus infection with Augmentin. For sore throat or cough try using a honey-based tea. Use 3 teaspoons of honey with juice squeezed from half lemon. Place shaved pieces of ginger into 1/2-1 cup of water and warm over stove top. Then mix the ingredients and repeat every 4 hours as needed. Please take ibuprofen 600mg  every 6 hours with food alternating with OR taken together with Tylenol 500mg -650mg  every 6 hours for throat pain, fevers, aches and pains. Hydrate very well with at least 2 liters of water. Eat light meals such as soups (chicken and noodles, vegetable, chicken and wild rice).  Do not eat foods that you are allergic to.  Taking an antihistamine like Zyrtec can help against postnasal drainage, sinus congestion which can cause sinus pain, sinus headaches, throat pain, painful swallowing, coughing.  You can take this together with pseudoephedrine (Sudafed) at a dose of 30 mg 3 times a day or twice daily as needed for the same kind of nasal drip, congestion.  Use cough medication as needed.

## 2023-06-06 ENCOUNTER — Encounter: Payer: Self-pay | Admitting: Nurse Practitioner

## 2023-06-06 ENCOUNTER — Other Ambulatory Visit: Payer: Self-pay | Admitting: Nurse Practitioner

## 2023-06-06 DIAGNOSIS — B9689 Other specified bacterial agents as the cause of diseases classified elsewhere: Secondary | ICD-10-CM

## 2023-06-06 MED ORDER — AZITHROMYCIN 250 MG PO TABS: ORAL_TABLET | ORAL | 1 refills | Status: DC

## 2023-06-06 NOTE — Progress Notes (Signed)
Pt was seen at urgent care and given Augmentin for sinus infection- she tried to take the medication and is having nausea and vomiting.  Sent in a new script for Zithromax 500 2 tabs day 1 and then 1 tab daily x 4 more days.  If no improvement in symptoms please notify the office and schedule an appointment

## 2023-06-18 ENCOUNTER — Other Ambulatory Visit: Payer: Self-pay | Admitting: Nurse Practitioner

## 2023-06-18 DIAGNOSIS — E079 Disorder of thyroid, unspecified: Secondary | ICD-10-CM

## 2023-06-18 DIAGNOSIS — F3341 Major depressive disorder, recurrent, in partial remission: Secondary | ICD-10-CM

## 2023-06-18 DIAGNOSIS — R102 Pelvic and perineal pain: Secondary | ICD-10-CM | POA: Diagnosis not present

## 2023-07-17 DIAGNOSIS — Z96652 Presence of left artificial knee joint: Secondary | ICD-10-CM | POA: Diagnosis not present

## 2023-07-17 DIAGNOSIS — Z96651 Presence of right artificial knee joint: Secondary | ICD-10-CM | POA: Diagnosis not present

## 2023-07-17 DIAGNOSIS — Z471 Aftercare following joint replacement surgery: Secondary | ICD-10-CM | POA: Diagnosis not present

## 2023-07-17 DIAGNOSIS — M25562 Pain in left knee: Secondary | ICD-10-CM | POA: Diagnosis not present

## 2023-07-17 DIAGNOSIS — Z96653 Presence of artificial knee joint, bilateral: Secondary | ICD-10-CM | POA: Diagnosis not present

## 2023-08-07 DIAGNOSIS — R102 Pelvic and perineal pain: Secondary | ICD-10-CM | POA: Diagnosis not present

## 2023-08-07 DIAGNOSIS — D259 Leiomyoma of uterus, unspecified: Secondary | ICD-10-CM | POA: Diagnosis not present

## 2023-08-19 ENCOUNTER — Ambulatory Visit: Payer: 59 | Admitting: Family Medicine

## 2023-08-21 ENCOUNTER — Ambulatory Visit: Payer: Self-pay | Admitting: Family Medicine

## 2023-08-21 ENCOUNTER — Encounter: Payer: Self-pay | Admitting: Family Medicine

## 2023-08-21 VITALS — BP 126/80 | HR 76 | Temp 97.0°F | Ht 67.5 in | Wt 220.4 lb

## 2023-08-21 DIAGNOSIS — E079 Disorder of thyroid, unspecified: Secondary | ICD-10-CM

## 2023-08-21 DIAGNOSIS — I10 Essential (primary) hypertension: Secondary | ICD-10-CM

## 2023-08-21 DIAGNOSIS — F3341 Major depressive disorder, recurrent, in partial remission: Secondary | ICD-10-CM

## 2023-08-21 DIAGNOSIS — M791 Myalgia, unspecified site: Secondary | ICD-10-CM | POA: Diagnosis not present

## 2023-08-21 DIAGNOSIS — E039 Hypothyroidism, unspecified: Secondary | ICD-10-CM | POA: Diagnosis not present

## 2023-08-21 DIAGNOSIS — M25511 Pain in right shoulder: Secondary | ICD-10-CM

## 2023-08-21 DIAGNOSIS — T466X5A Adverse effect of antihyperlipidemic and antiarteriosclerotic drugs, initial encounter: Secondary | ICD-10-CM

## 2023-08-21 DIAGNOSIS — E782 Mixed hyperlipidemia: Secondary | ICD-10-CM | POA: Diagnosis not present

## 2023-08-21 DIAGNOSIS — G8929 Other chronic pain: Secondary | ICD-10-CM

## 2023-08-21 DIAGNOSIS — F419 Anxiety disorder, unspecified: Secondary | ICD-10-CM

## 2023-08-21 DIAGNOSIS — L219 Seborrheic dermatitis, unspecified: Secondary | ICD-10-CM

## 2023-08-21 MED ORDER — TRIAMCINOLONE ACETONIDE 0.1 % EX OINT: TOPICAL_OINTMENT | Freq: Two times a day (BID) | CUTANEOUS | 0 refills | Status: AC | PRN

## 2023-08-21 MED ORDER — BUPROPION HCL ER (XL) 150 MG PO TB24: 150.0000 mg | ORAL_TABLET | Freq: Every morning | ORAL | 3 refills | Status: AC

## 2023-08-21 MED ORDER — MELOXICAM 7.5 MG PO TABS: 7.5000 mg | ORAL_TABLET | Freq: Every day | ORAL | 3 refills | Status: DC

## 2023-08-21 MED ORDER — LEVOTHYROXINE SODIUM 150 MCG PO TABS
ORAL_TABLET | ORAL | 3 refills | Status: AC
Start: 2023-08-21 — End: ?

## 2023-08-21 NOTE — Patient Instructions (Signed)
 VISIT SUMMARY:  Today, we discussed your overall health and management of several ongoing conditions. Your blood pressure is well-controlled, and you have made significant progress with your weight loss and dietary changes. We also reviewed your current medications and supplements, and discussed your recent feelings of anxiety.  YOUR PLAN:  -HYPERLIPIDEMIA: Hyperlipidemia means you have high levels of cholesterol in your blood. Your LDL cholesterol is slightly elevated, but you prefer to manage it through diet and supplements like berberine, krill oil, and bergamot. Continue with your current dietary management and supplements.  -HYPERTENSION: Hypertension is high blood pressure. Your blood pressure is well-controlled at 126/80 mmHg with healthy lifestyle.  Continue with your current management plan.  -HYPOTHYROIDISM: Hypothyroidism is when your thyroid gland doesn't produce enough thyroid hormone. You are managing this with levothyroxine, and your thyroid levels were normal at your last check. Continue taking levothyroxine as prescribed.  -DEPRESSION: Depression is a mood disorder that causes persistent feelings of sadness and loss of interest. You are managing your depression with bupropion, which has been effective for you. Continue taking bupropion as prescribed.  -SEBORRHEIC DERMATITIS: Seborrheic dermatitis is a skin condition that causes scaly patches and red skin, mainly on the scalp. You are using a strong topical steroid sparingly and avoiding the eye area. Continue using the ointment as needed, but avoid the eye area.  -GENERAL HEALTH MAINTENANCE: You have made significant progress with weight loss through dietary changes and time-restricted eating. You are interested in consulting a nutritionist for further guidance on a low-carb diet. You also take baby aspirin for heart protection and vitamin D for immune support. Consider seeing a nutritionist, and continue with your current  supplements.  INSTRUCTIONS:  Please schedule a physical and lab work in approximately one month. Ensure you fast before the lab work. We will also follow up on your recent anxiety at your next visit.

## 2023-08-21 NOTE — Progress Notes (Signed)
 Assessment/Plan:      Hyperlipidemia LDL cholesterol is elevated at 126 mg/dL. She experiences myalgias with statins, specifically rosuvastatin, and prefers dietary management and supplements such as berberine, krill oil, and bergamot over prescription medications. - Continue dietary management and supplements for cholesterol  Hypertension Blood pressure is well-controlled at 126/80 mmHg. - Continue current management of hypertension  Hypothyroidism Managed with levothyroxine. Thyroid levels were normal at last check (0.75). She experiences symptoms if a dose is missed. - Continue levothyroxine  Depression Managed with bupropion. Reports improved mood stability and no severe depressive symptoms. Family history of depression noted. - Continue bupropion  Seborrheic Dermatitis Affects areas around the eyes. Uses a strong topical steroid sparingly but advised to avoid use near eyes. Significant skin issues occur during winter. - Use topical steroid sparingly and avoid use near eyes  General Health Maintenance Focused on weight management with significant weight loss through dietary changes and time-restricted eating. Interested in consulting a nutritionist for low-carb diet guidance. Takes baby aspirin for cardiac protection and vitamin D for immune support. - Consider referral to a nutritionist for low-carb diet guidance - Continue baby aspirin - Continue vitamin D supplementation  Follow-up Due for a physical and lab work, delayed due to previous doctor's passing. Interested in regular health monitoring. Reports recent anxiety without a clear cause. - Schedule physical and lab work in approximately one month - Ensure fasting before lab work - Follow up on anxiety at next visit       Medications Discontinued During This Encounter  Medication Reason   Nutritional Supplements (JUICE PLUS FIBRE PO)    TURMERIC CURCUMIN PO    MAGNESIUM PO    b complex vitamins capsule     promethazine-dextromethorphan (PROMETHAZINE-DM) 6.25-15 MG/5ML syrup    pseudoephedrine (SUDAFED) 30 MG tablet    UNABLE TO FIND    amoxicillin-clavulanate (AUGMENTIN) 875-125 MG tablet    azithromycin (ZITHROMAX) 250 MG tablet    cetirizine (ZYRTEC ALLERGY) 10 MG tablet    triamcinolone ointment (KENALOG) 0.1 % Reorder   meloxicam (MOBIC) 7.5 MG tablet Reorder   levothyroxine (SYNTHROID) 150 MCG tablet Reorder   buPROPion (WELLBUTRIN XL) 150 MG 24 hr tablet Reorder    Return in about 1 month (around 09/21/2023) for physical (fasting labs).    Subjective:   Encounter date: 08/21/2023  Jenny Arroyo is a 63 y.o. female who has Thyroid disease; Morbid obesity (HCC); Other abnormal glucose; Hyperlipidemia; Hypertension; Depression, major, recurrent, in partial remission (HCC); Seborrheic dermatitis; Myalgia due to statin; Acquired hypothyroidism; Chronic right shoulder pain; and Anxiety on their problem list..   She  has a past medical history of Chronic kidney disease, Depression, Hypertension, PONV (postoperative nausea and vomiting), and Thyroid disease..   She presents with chief complaint of Establish Care (Hx of HTN, TSH, HLD & Depression. ) .   Discussed the use of AI scribe software for clinical note transcription with the patient, who gave verbal consent to proceed.  History of Present Illness   The patient presents for establishing care. She was referred by Dr. Michele Mcalpine, who has passed away.  She has a history of hypertension, which is well-controlled with a reading of 126/80 mmHg. She manages her blood pressure with medication, and no changes have been necessary, indicating stable management of the condition.  She has been managing her cholesterol levels through dietary changes, including a low-carb, whole-food diet, and has lost almost 80 pounds since August of the previous year. Despite these efforts, her LDL  cholesterol remains slightly elevated. She has a family  history of poor statin tolerance and experienced significant myalgias with rosuvastatin, leading to discontinuation of the medication. She takes several supplements, including berberine, krill oil, and bergamot, for cholesterol management and body composition.  She is on bupropion for depression, which she finds beneficial. She has a family history of depression and notes that her mood is better with the medication. She has been on it since her children were young, and her daughter has noticed a positive difference when she is on the medication.  For hypothyroidism, she takes levothyroxine but feels it has not significantly impacted her weight, which has been a lifelong struggle. Her thyroid levels were normal in September of the previous year. She reports a history of weight gain despite efforts to manage it through diet and exercise.  She reports feeling anxious for the past few weeks without a clear reason, which is unusual for her. 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 No chest pain or shortness of breath.  She has seborrheic dermatitis and uses a strong ointment sparingly, especially avoiding the eye area as advised by her eye doctor. She reports a difficult winter with her skin, experiencing sensations like 'broken glass' on her eyelids.  She occasionally uses diazepam for vertigo and back pain, with a prescription from 2021 still not fully used. She takes a baby aspirin daily for cardiac protection, despite no personal history of heart disease, but both parents had pacemakers. She follows a low-carb, whole-food diet and practices time-restricted eating. She uses meloxicam as needed for right shoulder pain, which she describes as 'wonky' and exacerbated by lifting. The medication helps alleviate the pain. She also takes vitamin D for immune support and reports rarely getting sick.         08/21/2023    1:20 PM 12/21/2018   10:15 AM   Depression screen PHQ 2/9  Decreased Interest 0 0  Down, Depressed, Hopeless 0 0  PHQ - 2 Score 0 0  Altered sleeping 2   Tired, decreased energy 1   Change in appetite 0   Feeling bad or failure about yourself  0   Trouble concentrating 0   Moving slowly or fidgety/restless 0   Suicidal thoughts 0   PHQ-9 Score 3   Difficult doing work/chores Not difficult at all       08/21/2023    1:20 PM  GAD 7 : Generalized Anxiety Score  Nervous, Anxious, on Edge 1  Control/stop worrying 0  Worry too much - different things 0  Trouble relaxing 0  Restless 0  Easily annoyed or irritable 0  Afraid - awful might happen 0  Total GAD 7 Score 1  Anxiety Difficulty Not difficult at all     HPI:   Review of Systems  All other systems reviewed and are negative.   Past Surgical History:  Procedure Laterality Date   APPENDECTOMY     CESAREAN SECTION     x1   CHOLECYSTECTOMY     ECTOPIC PREGNANCY SURGERY     KNEE ARTHROSCOPY Right 2015   LITHOTRIPSY     REPLACEMENT TOTAL KNEE Right 04/2018   REPLACEMENT TOTAL KNEE Left 07/11/2022   TONSILLECTOMY      Outpatient Medications Prior to Visit  Medication Sig Dispense Refill   APPLE CIDER VINEGAR PO Take by mouth.     aspirin 81 MG chewable tablet Chew by mouth daily.  Berberine Chloride (BERBERINE HCI PO) Take by mouth.     Cholecalciferol (VITAMIN D PO) Take 5,000 Int'l Units by mouth daily.     CITRUS BERGAMOT PO Take 1,200 mg by mouth.     clotrimazole-betamethasone (LOTRISONE) cream Apply 1 Application topically 2 (two) times daily. 15 g 2   diazepam (VALIUM) 5 MG tablet 1/2-1 pill every 8 hours for dizziness/vertigo 30 tablet 0   Krill Oil 500 MG CAPS Take by mouth.     Multiple Vitamin (MULTIVITAMIN) tablet Take 1 tablet by mouth daily.     NEOMYCIN-POLYMYXIN-HYDROCORTISONE (CORTISPORIN) 1 % SOLN OTIC solution Apply 1-2 drops to toe BID after soaking 10 mL 1   PSYLLIUM FIBER PO Take by mouth.     azithromycin (ZITHROMAX)  250 MG tablet Take 2 tablets (500 mg) on  Day 1,  followed by 1 tablet (250 mg) once daily on Days 2 through 5. 6 each 1   b complex vitamins capsule Take 1 capsule by mouth daily. Liquid     buPROPion (WELLBUTRIN XL) 150 MG 24 hr tablet Take 1 tablet by mouth every morning. 90 tablet 3   cetirizine (ZYRTEC ALLERGY) 10 MG tablet Take 1 tablet (10 mg total) by mouth daily. 30 tablet 0   levothyroxine (SYNTHROID) 150 MCG tablet Take 1/2 tablet by mouth on Tuesday and Thursday, and take 1 tablet by mouth on all other days. Take on an empty stomach with only water for 30 minutes. No antacid, calcium or magnesium medication for 4 hours, and avoid biotin. 80 tablet 3   meloxicam (MOBIC) 7.5 MG tablet Take 1 tablet (7.5 mg total) by mouth daily. 30 tablet 2   triamcinolone ointment (KENALOG) 0.1 % Apply 1 application topically twice daily. 80 g 0   UNABLE TO FIND Med Name: VITAMIN B COMPLEX LIQUID     amoxicillin-clavulanate (AUGMENTIN) 875-125 MG tablet Take 1 tablet by mouth 2 (two) times daily. (Patient not taking: Reported on 08/21/2023) 14 tablet 0   MAGNESIUM PO Take by mouth. (Patient not taking: Reported on 04/24/2023)     Nutritional Supplements (JUICE PLUS FIBRE PO) Take by mouth. 3 Fruit Veggies Reds (Patient not taking: Reported on 04/24/2023)     promethazine-dextromethorphan (PROMETHAZINE-DM) 6.25-15 MG/5ML syrup Take 5 mLs by mouth 3 (three) times daily as needed for cough. (Patient not taking: Reported on 08/21/2023) 200 mL 0   pseudoephedrine (SUDAFED) 30 MG tablet Take 1 tablet (30 mg total) by mouth every 8 (eight) hours as needed for congestion. (Patient not taking: Reported on 08/21/2023) 30 tablet 0   TURMERIC CURCUMIN PO Take by mouth. (Patient not taking: Reported on 04/24/2023)     No facility-administered medications prior to visit.    Family History  Problem Relation Age of Onset   Hypertension Mother    Alzheimer's disease Mother 10   Cancer Father 67       colon     Hypertension Father    Arthritis Father    Gout Father    Colon cancer Father 85   Pancreatic cancer Father    Depression Brother    Mental illness Daughter    Alzheimer's disease Maternal Grandfather 62   Colon polyps Neg Hx    Rectal cancer Neg Hx    Stomach cancer Neg Hx    Esophageal cancer Neg Hx     Social History   Socioeconomic History   Marital status: Married    Spouse name: Not on file   Number of children: 2  Years of education: Not on file   Highest education level: Not on file  Occupational History   Not on file  Tobacco Use   Smoking status: Never   Smokeless tobacco: Never  Vaping Use   Vaping status: Never Used  Substance and Sexual Activity   Alcohol use: No   Drug use: No   Sexual activity: Not Currently  Other Topics Concern   Not on file  Social History Narrative   Not on file   Social Drivers of Health   Financial Resource Strain: Not on file  Food Insecurity: Low Risk  (06/21/2022)   [facility]   Hunger Vital Sign    Worried About Running Out of Food in the Last Year: Never true    Ran Out of Food in the Last Year: Never true  Transportation Needs: Not on file (06/21/2022)  Physical Activity: Not on file  Stress: Not on file  Social Connections: Not on file  Intimate Partner Violence: Not on file                                                                                                  Objective:  Physical Exam: BP 126/80   Pulse 76   Temp (!) 97 F (36.1 C) (Temporal)   Ht 5' 7.5" (1.715 m)   Wt 220 lb 6.4 oz (100 kg)   [name] 10/24/2017   SpO2 98%   BMI 34.01 kg/m     Physical Exam Constitutional:      General: She is not in acute distress.    Appearance: Normal appearance. She is not ill-appearing or toxic-appearing.  HENT:     Head: Normocephalic and atraumatic.     Nose: Nose normal. No congestion.  Eyes:     General: No scleral icterus.    Extraocular Movements: Extraocular  movements intact.  Cardiovascular:     Rate and Rhythm: Normal rate and regular rhythm.     Pulses: Normal pulses.     Heart sounds: Normal heart sounds.  Pulmonary:     Effort: Pulmonary effort is normal. No respiratory distress.     Breath sounds: Normal breath sounds.  Abdominal:     General: Abdomen is flat. Bowel sounds are normal.     Palpations: Abdomen is soft.  Musculoskeletal:        General: Normal range of motion.  Lymphadenopathy:     Cervical: No cervical adenopathy.  Skin:    General: Skin is warm and dry.     Findings: No rash.  Neurological:     General: No focal deficit present.     Mental Status: She is alert and oriented to person, place, and time. Mental status is at baseline.  Psychiatric:        Mood and Affect: Mood normal.        Behavior: Behavior normal.        Thought Content: Thought content normal.        Judgment: Judgment normal.       Results   LABS TSH: 0.75 (02/25/2023) Total Cholesterol:  208 (2024) LDL: 126 (2024)       No results found.  No results found for this or any previous visit (from the past 2160 hours).      Garner Nash, MD, MS

## 2023-08-26 ENCOUNTER — Encounter: Payer: Self-pay | Admitting: Family Medicine

## 2023-08-26 DIAGNOSIS — K130 Diseases of lips: Secondary | ICD-10-CM | POA: Diagnosis not present

## 2023-08-26 DIAGNOSIS — L218 Other seborrheic dermatitis: Secondary | ICD-10-CM | POA: Diagnosis not present

## 2023-09-08 ENCOUNTER — Encounter: Payer: 59 | Admitting: Nurse Practitioner

## 2023-09-24 ENCOUNTER — Ambulatory Visit: Admitting: Family Medicine

## 2023-09-29 ENCOUNTER — Encounter: Admitting: Family Medicine

## 2023-09-30 ENCOUNTER — Ambulatory Visit
Admission: RE | Admit: 2023-09-30 | Discharge: 2023-09-30 | Disposition: A | Discharge status: Home / Self Care | Source: Ambulatory Visit | Attending: Family Medicine | Admitting: Family Medicine

## 2023-09-30 ENCOUNTER — Ambulatory Visit (INDEPENDENT_AMBULATORY_CARE_PROVIDER_SITE_OTHER): Admitting: Family Medicine

## 2023-09-30 VITALS — BP 128/82 | HR 79 | Temp 98.0°F | Ht 67.5 in | Wt 218.6 lb

## 2023-09-30 DIAGNOSIS — L219 Seborrheic dermatitis, unspecified: Secondary | ICD-10-CM

## 2023-09-30 DIAGNOSIS — D751 Secondary polycythemia: Secondary | ICD-10-CM | POA: Diagnosis not present

## 2023-09-30 DIAGNOSIS — Z0000 Encounter for general adult medical examination without abnormal findings: Secondary | ICD-10-CM

## 2023-09-30 DIAGNOSIS — M79604 Pain in right leg: Secondary | ICD-10-CM | POA: Diagnosis not present

## 2023-09-30 DIAGNOSIS — M791 Myalgia, unspecified site: Secondary | ICD-10-CM

## 2023-09-30 DIAGNOSIS — M25551 Pain in right hip: Secondary | ICD-10-CM | POA: Diagnosis not present

## 2023-09-30 DIAGNOSIS — Z87442 Personal history of urinary calculi: Secondary | ICD-10-CM

## 2023-09-30 DIAGNOSIS — F419 Anxiety disorder, unspecified: Secondary | ICD-10-CM

## 2023-09-30 DIAGNOSIS — E782 Mixed hyperlipidemia: Secondary | ICD-10-CM

## 2023-09-30 DIAGNOSIS — E538 Deficiency of other specified B group vitamins: Secondary | ICD-10-CM | POA: Diagnosis not present

## 2023-09-30 DIAGNOSIS — E039 Hypothyroidism, unspecified: Secondary | ICD-10-CM

## 2023-09-30 DIAGNOSIS — Z8669 Personal history of other diseases of the nervous system and sense organs: Secondary | ICD-10-CM | POA: Insufficient documentation

## 2023-09-30 DIAGNOSIS — E6609 Other obesity due to excess calories: Secondary | ICD-10-CM

## 2023-09-30 DIAGNOSIS — E66811 Obesity, class 1: Secondary | ICD-10-CM

## 2023-09-30 DIAGNOSIS — Z1159 Encounter for screening for other viral diseases: Secondary | ICD-10-CM

## 2023-09-30 DIAGNOSIS — I10 Essential (primary) hypertension: Secondary | ICD-10-CM | POA: Diagnosis not present

## 2023-09-30 DIAGNOSIS — R221 Localized swelling, mass and lump, neck: Secondary | ICD-10-CM

## 2023-09-30 DIAGNOSIS — M1611 Unilateral primary osteoarthritis, right hip: Secondary | ICD-10-CM | POA: Diagnosis not present

## 2023-09-30 DIAGNOSIS — F3341 Major depressive disorder, recurrent, in partial remission: Secondary | ICD-10-CM

## 2023-09-30 DIAGNOSIS — T466X5A Adverse effect of antihyperlipidemic and antiarteriosclerotic drugs, initial encounter: Secondary | ICD-10-CM

## 2023-09-30 DIAGNOSIS — E559 Vitamin D deficiency, unspecified: Secondary | ICD-10-CM | POA: Diagnosis not present

## 2023-09-30 DIAGNOSIS — Z6833 Body mass index (BMI) 33.0-33.9, adult: Secondary | ICD-10-CM

## 2023-09-30 LAB — URINALYSIS, ROUTINE W REFLEX MICROSCOPIC
Bilirubin Urine: NEGATIVE
Hgb urine dipstick: NEGATIVE
Ketones, ur: NEGATIVE
Leukocytes,Ua: NEGATIVE
Nitrite: NEGATIVE
RBC / HPF: NONE SEEN (ref 0–?)
Specific Gravity, Urine: <=1.005 — AB (ref 1.000–1.030)
Total Protein, Urine: NEGATIVE
Urine Glucose: NEGATIVE
Urobilinogen, UA: 0.2 (ref 0.0–1.0)
WBC, UA: NONE SEEN (ref 0–?)
pH: 6 (ref 5.0–8.0)

## 2023-09-30 LAB — CBC WITH DIFFERENTIAL/PLATELET
Basophils Absolute: 0 10*3/uL (ref 0.0–0.1)
Basophils Relative: 0.8 % (ref 0.0–3.0)
Eosinophils Absolute: 0.2 10*3/uL (ref 0.0–0.7)
Eosinophils Relative: 2.5 % (ref 0.0–5.0)
HCT: 46.3 % — ABNORMAL HIGH (ref 36.0–46.0)
Hemoglobin: 15.7 g/dL — ABNORMAL HIGH (ref 12.0–15.0)
Lymphocytes Relative: 24.4 % (ref 12.0–46.0)
Lymphs Abs: 1.6 10*3/uL (ref 0.7–4.0)
MCHC: 34 g/dL (ref 30.0–36.0)
MCV: 92.6 fl (ref 78.0–100.0)
Monocytes Absolute: 0.4 10*3/uL (ref 0.1–1.0)
Monocytes Relative: 6.9 % (ref 3.0–12.0)
Neutro Abs: 4.3 10*3/uL (ref 1.4–7.7)
Neutrophils Relative %: 65.4 % (ref 43.0–77.0)
Platelets: 217 10*3/uL (ref 150.0–400.0)
RBC: 5.01 Mil/uL (ref 3.87–5.11)
RDW: 13.9 % (ref 11.5–15.5)
WBC: 6.5 10*3/uL (ref 4.0–10.5)

## 2023-09-30 LAB — B12 AND FOLATE PANEL
Folate: >25.2 ng/mL (ref >5.9)
Vitamin B-12: 1091 pg/mL — ABNORMAL HIGH (ref 211–911)

## 2023-09-30 LAB — LIPID PANEL
Cholesterol: 200 mg/dL (ref 0–200)
HDL: 65.3 mg/dL (ref >39.00)
LDL Cholesterol: 116 mg/dL — ABNORMAL HIGH (ref 0–99)
NonHDL: 134.77
Total CHOL/HDL Ratio: 3
Triglycerides: 94 mg/dL (ref 0.0–149.0)
VLDL: 18.8 mg/dL (ref 0.0–40.0)

## 2023-09-30 LAB — COMPREHENSIVE METABOLIC PANEL WITH GFR
ALT: 19 U/L (ref 0–35)
AST: 14 U/L (ref 0–37)
Albumin: 4.6 g/dL (ref 3.5–5.2)
Alkaline Phosphatase: 87 U/L (ref 39–117)
BUN: 13 mg/dL (ref 6–23)
CO2: 29 meq/L (ref 19–32)
Calcium: 9.6 mg/dL (ref 8.4–10.5)
Chloride: 103 meq/L (ref 96–112)
Creatinine, Ser: 0.62 mg/dL (ref 0.40–1.20)
GFR: 96.25 mL/min (ref >60.00)
Glucose, Bld: 92 mg/dL (ref 70–99)
Potassium: 3.6 meq/L (ref 3.5–5.1)
Sodium: 140 meq/L (ref 135–145)
Total Bilirubin: 0.7 mg/dL (ref 0.2–1.2)
Total Protein: 6.6 g/dL (ref 6.0–8.3)

## 2023-09-30 LAB — MICROALBUMIN / CREATININE URINE RATIO
Creatinine,U: 18.1 mg/dL
Microalb Creat Ratio: UNDETERMINED mg/g (ref 0.0–30.0)
Microalb, Ur: <0.7 mg/dL

## 2023-09-30 LAB — HEMOGLOBIN A1C: Hgb A1c MFr Bld: 5.3 % (ref 4.6–6.5)

## 2023-09-30 LAB — VITAMIN D 25 HYDROXY (VIT D DEFICIENCY, FRACTURES): VITD: 94.08 ng/mL (ref 30.00–100.00)

## 2023-09-30 MED ORDER — MELOXICAM 15 MG PO TABS: 15.0000 mg | ORAL_TABLET | Freq: Every day | ORAL | 0 refills | Status: DC

## 2023-09-30 NOTE — Patient Instructions (Addendum)
 VISIT SUMMARY:  Today, you had your annual physical exam and discussed your hip pain, weight management, skin condition, and overall health. We reviewed your current medications and health history, and planned several follow-up actions to address your concerns.  YOUR PLAN:  -HIP PAIN: You have been experiencing dull, achy pain in your right hip that radiates down your leg, affecting your mobility. This could be due to bursitis, which is inflammation of the fluid-filled sacs that cushion your joints. We will increase your meloxicam  dose to 15 mg as needed for pain relief, order an x-ray of your right hip, and provide you with hip exercises. Depending on the x-ray results, physical therapy may be recommended.  For xray, go to:    [facility] [address] [place], [place], [place] [address] Phone: [phone]   -OBESITY: Your BMI is 33, and although you have lost 70 pounds, you are currently at a weight plateau. We discussed your dietary habits and your interest in consulting a nutritionist. We will refer you to a nutritionist for further dietary guidance and support.  -SEBORRHEIC DERMATITIS: You have seborrheic dermatitis, a skin condition causing painful, flaky skin under and over your eyes. Since you are not satisfied with your current treatment, we will refer you to a different dermatologist for further evaluation and management.  -HYPOTHYROIDISM: Your hypothyroidism is well-managed with your current dose of levothyroxine  150 mcg. No changes are needed at this time.  -ELEVATED LDL: You have elevated LDL cholesterol levels and have had adverse reactions to statins in the past. We will monitor your cholesterol levels with lab work and continue to manage it non-pharmacologically.  -CATARACT SURGERY: You have a history of cataract surgery and regularly follow up with your eye doctor. No current issues were reported.  -KIDNEY STONES: You have a history of kidney stones, which have been  managed with lithotripsy and passage. No recent episodes were reported.  Neck mass: We are ordering MRI of the head neck to assess for causes of the neck mass.  This includes lymph node assessment and potential malignancy.  We are also referring to ear nose and throat to discuss further workup and possible biopsy.  Brodie Cannon VISIT: During your routine wellness examination, we discussed your overall health, weight management, and dietary habits. We will perform comprehensive lab work to assess your thyroid  function, cholesterol, blood sugar, kidney and liver function, and blood counts. We will also check your urine for protein or blood cells, and your vitamin D  and B vitamins levels. We will discuss the lab results via MyChart unless an urgent follow-up is needed.  INSTRUCTIONS:  Please follow up in 6 months for a routine check-up unless earlier follow-up is needed based on lab or imaging results. We will coordinate referrals to ENT, dermatology and nutrition. An MRI of the head and neck and x-ray of your right hip has been ordered, and we will discuss the results once they are available.

## 2023-09-30 NOTE — Progress Notes (Signed)
 Assessment  Assessment/Plan:  Assessment and Plan Assessment & Plan Hip pain Right hip pain for 3-4 weeks, dull and achy, radiating down the leg, affecting mobility. No prior hip issues. Differential includes bursitis. Meloxicam  7.5 mg provides minimal relief. No recent imaging of the hip. Discussed increasing meloxicam  to 15 mg and potential physical therapy based on x-ray results. - Order x-ray of the right hip. - Increase meloxicam  to 15 mg as needed for pain relief. - Provide hip exercises for pain management. - Consider physical therapy based on x-ray results.  Obesity BMI of 33. History of significant weight loss (70 pounds) but current weight plateau. Discussed dietary habits and interest in nutritionist referral. Discussed potential benefits of weight loss medications like Wegovy , which have shown significant benefits in weight loss and reduction of cardiovascular and kidney disease risks, but she prefers non-pharmacological approaches. - Refer to a nutritionist for dietary guidance and support.  Seborrheic dermatitis Seborrheic dermatitis under and over the eyes, painful and not responding well to current treatment. Dissatisfaction with current dermatologist. - Refer to an alternative dermatologist for further evaluation and management.  Hypothyroidism Well-managed on levothyroxine  150 mcg.  Elevated LDL Elevated LDL, previously attempted statin therapy but experienced adverse effects. Prefers non-pharmacological management of cholesterol. - Monitor cholesterol levels with lab work.  Cataract surgery Regular follow-up with eye doctor. No current issues reported.  Kidney stones Previously managed with lithotripsy and passage. No recent episodes reported.  Wellness Visit Routine wellness examination conducted. Discussed overall health, including weight management and dietary habits. Comprehensive lab work planned to assess thyroid  function, cholesterol, blood sugar, kidney  and liver function, and blood counts. - Perform comprehensive lab work including thyroid  panel, cholesterol panel, blood sugar, kidney function, liver function, and blood counts. - Check urine for protein or blood cells. - Check vitamin D  and B vitamins levels. - Discuss lab results via MyChart unless urgent follow-up is needed.  Follow-up Discussed follow-up plans based on lab results and imaging outcomes. - Follow up in 6 months for routine check-up unless earlier follow-up is needed based on lab or imaging results. - Coordinate referrals to dermatology and nutrition.     There are no discontinued medications.  Patient Counseling(The following topics were reviewed and/or handout was given):  -Nutrition: Stressed importance of moderation in sodium/caffeine intake, saturated fat and cholesterol, caloric balance, sufficient intake of fresh fruits, vegetables, and fiber.  -Stressed the importance of regular exercise.   -Substance Abuse: Discussed cessation/primary prevention of tobacco, alcohol, or other drug use; driving or other dangerous activities under the influence; availability of treatment for abuse.   -Injury prevention: Discussed safety belts, safety helmets, smoke detector, smoking near bedding or upholstery.   -Sexuality: Discussed sexually transmitted diseases, partner selection, use of condoms, avoidance of unintended pregnancy and contraceptive alternatives.   -Dental health: Discussed importance of regular tooth brushing, flossing, and dental visits.  -Health maintenance and immunizations reviewed. Please refer to Health maintenance section.  No follow-ups on file.        Subjective:   Encounter date: 09/30/2023  Chief Complaint  Patient presents with   Annual Exam    CPE. Fasting    Discussed the use of AI scribe software for clinical note transcription with the patient, who gave verbal consent to proceed.  History of Present Illness Jenny Arroyo  "Jenny Arroyo" is a 63 year old female who presents for an annual physical exam and evaluation of hip pain.  She has been experiencing dull, achy hip pain for  the past three to four weeks, located in the buttock area and radiating down her leg. The pain is somewhat constant, with occasional flare-ups, and affects her ability to walk and perform daily activities, such as walking her dogs. She rates the pain as a four out of ten. She has been taking meloxicam  7.5 mg as needed for the past twelve days, which has not provided significant relief. Heat application has been noted to soothe the pain. There is no known injury associated with the onset of the pain.  She has a history of significant weight loss, having lost about seventy pounds, and is currently maintaining her weight. She wants to further reduce her weight, aiming to be in the hundreds rather than the two hundreds. Her highest recorded weight was 313 pounds. She follows a diet low in refined sugars and processed foods, consuming about 2000 calories a day. She is interested in consulting a nutritionist to further manage her weight and improve her relationship with food.  She has a history of high cholesterol and was previously almost prediabetic. She has a BMI of 33 and a history of elevated liver function tests. She has been on levothyroxine  150 mcg for hypothyroidism for many years. She has experienced elevated LDL levels since starting a keto diet and has had adverse reactions to statins in the past.  She reports a history of seborrheic dermatitis under and over her eyes, which is painful and not well managed with current treatment. She has used triamcinolone  in the past, which provided relief, but was advised against using it around her eyes. She has also experienced eczema-like symptoms in her ears.  She mentions a long-standing lump on the right side of her neck, which was previously evaluated and deemed non-concerning. She has a history of kidney  stones, having had one blasted and passed a few others.  No chest pain or shortness of breath. She reports fatigue, which she attributes to her age and decreased physical activity. She has a history of cataract surgery, possibly related to her thyroid  condition, and sees an eye doctor annually. She also sees a dentist regularly.       09/30/2023    9:44 AM 08/21/2023    1:20 PM 12/21/2018   10:15 AM  Depression screen PHQ 2/9  Decreased Interest 0 0 0  Down, Depressed, Hopeless 0 0 0  PHQ - 2 Score 0 0 0  Altered sleeping 2 2   Tired, decreased energy 3 1   Change in appetite 0 0   Feeling bad or failure about yourself  0 0   Trouble concentrating 0 0   Moving slowly or fidgety/restless 0 0   Suicidal thoughts 0 0   PHQ-9 Score 5 3   Difficult doing work/chores Somewhat difficult Not difficult at all        09/30/2023    9:44 AM 08/21/2023    1:20 PM  GAD 7 : Generalized Anxiety Score  Nervous, Anxious, on Edge 0 1  Control/stop worrying 0 0  Worry too much - different things 0 0  Trouble relaxing 0 0  Restless 0 0  Easily annoyed or irritable 0 0  Afraid - awful might happen 0 0  Total GAD 7 Score 0 1  Anxiety Difficulty Not difficult at all Not difficult at all    Health Maintenance Due  Topic Date Due   HIV Screening  Never done   Hepatitis C Screening  Never done     Dental: UTD  Vision: UTD  PMH:  The following were reviewed and entered/updated in epic: Past Medical History:  Diagnosis Date   Arthritis 2015   Having hip pain need to discuss   Cataract 2012   Surgery   Chronic kidney disease    kidney stones    Depression    mild on Bupropion     Hypertension    PONV (postoperative nausea and vomiting)    Thyroid  disease     Patient Active Problem List   Diagnosis Date Noted   Seborrheic dermatitis 08/21/2023   Myalgia due to statin 08/21/2023   Acquired hypothyroidism 08/21/2023   Chronic right shoulder pain 08/21/2023   Anxiety 08/21/2023    Depression, major, recurrent, in partial remission (HCC) 07/21/2019   Hypertension 11/05/2017   Hyperlipidemia 11/04/2017   Other abnormal glucose 02/22/2016   Morbid obesity (HCC) 05/10/2014   Thyroid  disease     Past Surgical History:  Procedure Laterality Date   APPENDECTOMY     CESAREAN SECTION     x1   CHOLECYSTECTOMY     ECTOPIC PREGNANCY SURGERY     EYE SURGERY     JOINT REPLACEMENT     KNEE ARTHROSCOPY Right 2015   LITHOTRIPSY     REPLACEMENT TOTAL KNEE Right 04/2018   REPLACEMENT TOTAL KNEE Left 07/11/2022   TONSILLECTOMY      Family History  Problem Relation Age of Onset   Hypertension Mother    Alzheimer's disease Mother 61   Arthritis Mother    Depression Mother    Obesity Mother    Varicose Veins Mother    Cancer Father 76       colon    Hypertension Father    Arthritis Father    Gout Father    Colon cancer Father 67   Pancreatic cancer Father    Obesity Father    Depression Brother    Mental illness Daughter    Alzheimer's disease Maternal Grandfather 105   Depression Sister    Obesity Sister    Colon polyps Neg Hx    Rectal cancer Neg Hx    Stomach cancer Neg Hx    Esophageal cancer Neg Hx     Medications- reviewed and updated Outpatient Medications Prior to Visit  Medication Sig Dispense Refill   APPLE CIDER VINEGAR PO Take by mouth.     aspirin 81 MG chewable tablet Chew by mouth daily.     buPROPion  (WELLBUTRIN  XL) 150 MG 24 hr tablet Take 1 tablet (150 mg total) by mouth every morning. 90 tablet 3   Cholecalciferol (VITAMIN D  PO) Take 5,000 Int'l Units by mouth daily.     clotrimazole -betamethasone  (LOTRISONE ) cream Apply 1 Application topically 2 (two) times daily. 15 g 2   diazepam  (VALIUM ) 5 MG tablet 1/2-1 pill every 8 hours for dizziness/vertigo 30 tablet 0   Krill Oil 500 MG CAPS Take by mouth.     levothyroxine  (SYNTHROID ) 150 MCG tablet Take 1/2 tablet by mouth on Tuesday and Thursday, and, take 1 tablet by mouth on all other  days. Take on an empty stomach with only water for 30 minutes. No antacid, calcium  or magnesium medication for 4 hours, and avoid biotin. 80 tablet 3   meloxicam  (MOBIC ) 7.5 MG tablet Take 1 tablet (7.5 mg total) by mouth daily. 90 tablet 3   Multiple Vitamin (MULTIVITAMIN) tablet Take 1 tablet by mouth daily.     NEOMYCIN -POLYMYXIN-HYDROCORTISONE (CORTISPORIN) 1 % SOLN OTIC solution Apply 1-2 drops to toe BID after  soaking 10 mL 1   PSYLLIUM FIBER PO Take by mouth.     triamcinolone  ointment (KENALOG ) 0.1 % Apply topically 2 (two) times daily as needed (rash). 80 g 0   Berberine Chloride (BERBERINE HCI PO) Take by mouth. (Patient not taking: Reported on 09/30/2023)     CITRUS BERGAMOT PO Take 1,200 mg by mouth. (Patient not taking: Reported on 09/30/2023)     No facility-administered medications prior to visit.    Allergies  Allergen Reactions   Rosuvastatin      Myalgia at lowest dose 3 days a week    Social History   Socioeconomic History   Marital status: Married    Spouse name: Not on file   Number of children: 2   Years of education: Not on file   Highest education level: Associate degree: academic program  Occupational History   Not on file  Tobacco Use   Smoking status: Never   Smokeless tobacco: Never  Vaping Use   Vaping status: Never Used  Substance and Sexual Activity   Alcohol use: Never   Drug use: Never   Sexual activity: Not Currently  Other Topics Concern   Not on file  Social History Narrative   Not on file   Social Drivers of Health   Financial Resource Strain: Low Risk  (09/30/2023)   Overall Financial Resource Strain (CARDIA)    Difficulty of Paying Living Expenses: Not hard at all  Food Insecurity: No Food Insecurity (09/30/2023)   Hunger Vital Sign    Worried About Running Out of Food in the Last Year: Never true    Ran Out of Food in the Last Year: Never true  Transportation Needs: No Transportation Needs (09/30/2023)   PRAPARE - Therapist, art (Medical): No    Lack of Transportation (Non-Medical): No  Physical Activity: Insufficiently Active (09/30/2023)   Exercise Vital Sign    Days of Exercise per Week: 2 days    Minutes of Exercise per Session: 20 min  Stress: No Stress Concern Present (09/30/2023)   [employer] of Occupational Health - Occupational Stress Questionnaire    Feeling of Stress : Only a little  Social Connections: Socially Integrated (09/30/2023)   Social Connection and Isolation Panel [NHANES]    Frequency of Communication with Friends and Family: More than three times a week    Frequency of Social Gatherings with Friends and Family: Twice a week    Attends Religious Services: More than 4 times per year    Active Member of [employer] or Organizations: Yes    Attends Engineer, structural: More than 4 times per year    Marital Status: Married           Objective:  Physical Exam: BP 128/82   Pulse 79   Temp 98 F (36.7 C) (Temporal)   Ht 5' 7.5" (1.715 m)   Wt 218 lb 9.6 oz (99.2 kg)   [name] 10/24/2017   SpO2 97%   BMI 33.73 kg/m   Body mass index is 33.73 kg/m. Wt Readings from Last 3 Encounters:  09/30/23 218 lb 9.6 oz (99.2 kg)  08/21/23 220 lb 6.4 oz (100 kg)  04/24/23 216 lb 9.6 oz (98.2 kg)    Physical Exam MEASUREMENTS: BMI- 33.0. GENERAL: Alert, cooperative, well developed, no acute distress. HEENT: Normocephalic, normal oropharynx, moist mucous membranes, eyes normal, ears normal. NECK: Right posterior neck lump present. CHEST: Clear to auscultation bilaterally, no wheezes, rhonchi, or crackles. CARDIOVASCULAR:  Normal heart rate and rhythm, S1 and S2 normal without murmurs. ABDOMEN: Soft, non-tender, non-distended, without organomegaly, normal bowel sounds. EXTREMITIES: No cyanosis or edema. MUSCULOSKELETAL: Hips non-tender. NEUROLOGICAL: Cranial nerves grossly intact, moves all extremities without gross motor or sensory deficit.  Physical  Exam Constitutional:      General: She is not in acute distress.    Appearance: Normal appearance. She is not ill-appearing or toxic-appearing.  HENT:     Head: Normocephalic and atraumatic.     Right Ear: Hearing, tympanic membrane, ear canal and external ear normal. There is no impacted cerumen.     Left Ear: Hearing, tympanic membrane, ear canal and external ear normal. There is no impacted cerumen.     Nose: Nose normal. No congestion.     Mouth/Throat:     Lips: No lesions.     Mouth: Mucous membranes are moist.     Pharynx: Oropharynx is clear. No oropharyngeal exudate.  Eyes:     General: No scleral icterus.       Right eye: No discharge.        Left eye: No discharge.     Extraocular Movements: Extraocular movements intact.     Conjunctiva/sclera: Conjunctivae normal.     Pupils: Pupils are equal, round, and reactive to light.  Neck:     Thyroid : No thyroid  mass, thyromegaly or thyroid  tenderness.   Cardiovascular:     Rate and Rhythm: Normal rate and regular rhythm.     Pulses: Normal pulses.     Heart sounds: Normal heart sounds.  Pulmonary:     Effort: Pulmonary effort is normal. No respiratory distress.     Breath sounds: Normal breath sounds.  Abdominal:     General: Abdomen is flat. Bowel sounds are normal.     Palpations: Abdomen is soft.  Musculoskeletal:        General: Normal range of motion.     Cervical back: Normal range of motion.     Lumbar back: No spasms or tenderness. Negative right straight leg raise test and negative left straight leg raise test.     Right hip: No deformity, tenderness or crepitus. Normal range of motion. Normal strength.     Right lower leg: No edema.     Left lower leg: No edema.  Lymphadenopathy:     Cervical: Cervical adenopathy present.     Right cervical: Posterior cervical adenopathy present.  Skin:    General: Skin is warm and dry.     Findings: No rash.  Neurological:     General: No focal deficit present.      Mental Status: She is alert and oriented to person, place, and time. Mental status is at baseline.     Deep Tendon Reflexes:     Reflex Scores:      Patellar reflexes are 2+ on the right side and 2+ on the left side. Psychiatric:        Mood and Affect: Mood normal.        Behavior: Behavior normal.        Thought Content: Thought content normal.        Judgment: Judgment normal.         Prior labs:   No results found for this or any previous visit (from the past 2160 hours).  Lab Results  Component Value Date   CHOL 208 (H) 02/25/2023   CHOL 230 (H) 09/05/2022   CHOL 226 (H) 03/12/2022   Lab Results  Component Value  Date   HDL 61 02/25/2023   HDL 53 09/05/2022   HDL 47 (L) 03/12/2022   Lab Results  Component Value Date   LDLCALC 126 (H) 02/25/2023   LDLCALC 154 (H) 09/05/2022   LDLCALC 150 (H) 03/12/2022   Lab Results  Component Value Date   TRIG 103 02/25/2023   TRIG 113 09/05/2022   TRIG 158 (H) 03/12/2022   Lab Results  Component Value Date   CHOLHDL 3.4 02/25/2023   CHOLHDL 4.3 09/05/2022   CHOLHDL 4.8 03/12/2022   No results found for: "LDLDIRECT"  Last metabolic panel Lab Results  Component Value Date   GLUCOSE 88 02/25/2023   NA 139 02/25/2023   K 3.8 02/25/2023   CL 106 02/25/2023   CO2 25 02/25/2023   BUN 12 02/25/2023   CREATININE 0.66 02/25/2023   EGFR 100 02/25/2023   CALCIUM  9.7 02/25/2023   PROT 6.9 02/25/2023   ALBUMIN 3.9 08/22/2016   BILITOT 0.5 02/25/2023   ALKPHOS 78 08/22/2016   AST 17 02/25/2023   ALT 16 02/25/2023    Lab Results  Component Value Date   HGBA1C 5.3 03/04/2023    Last CBC Lab Results  Component Value Date   WBC 7.0 02/25/2023   HGB 16.0 (H) 02/25/2023   HCT 47.4 (H) 02/25/2023   MCV 90.5 02/25/2023   MCH 30.5 02/25/2023   RDW 13.3 02/25/2023   PLT 240 02/25/2023    Lab Results  Component Value Date   TSH 0.75 02/25/2023    No results found for: "PSA1", "PSA"  Last vitamin D  Lab Results   Component Value Date   VD25OH 85 09/05/2022    Lab Results  Component Value Date   BILIRUBINUR NEGATIVE 02/22/2016   PROTEINUR NEGATIVE 09/05/2022   UROBILINOGEN 0.2 01/24/2014   LEUKOCYTESUR 2+ (A) 09/05/2022    Lab Results  Component Value Date   MICROALBUR 1.3 09/05/2022   MICROALBUR <0.2 08/09/2021     At today's visit, we discussed treatment options, associated risk and benefits, and engage in counseling as needed.  Additionally the following were reviewed: Past medical records, past medical and surgical history, family and social background, as well as relevant laboratory results, imaging findings, and specialty notes, where applicable.  This message was generated using dictation software, and as a result, it may contain unintentional typos or errors.  Nevertheless, extensive effort was made to accurately convey at the pertinent aspects of the patient visit.    There may have been are other unrelated non-urgent complaints, but due to the busy schedule and the amount of time already spent with her, time does not permit to address these issues at today's visit. Another appointment may have or has been requested to review these additional issues.     Harle Libra, MD, MS

## 2023-10-01 LAB — HIV ANTIBODY (ROUTINE TESTING W REFLEX): HIV 1&2 Ab, 4th Generation: NONREACTIVE

## 2023-10-01 LAB — THYROID PANEL WITH TSH
Free Thyroxine Index: 3.3 (ref 1.4–3.8)
T3 Uptake: 35 % (ref 22–35)
T4, Total: 9.5 ug/dL (ref 5.1–11.9)
TSH: 0.71 m[IU]/L (ref 0.40–4.50)

## 2023-10-01 LAB — HEPATITIS C ANTIBODY: Hepatitis C Ab: NONREACTIVE

## 2023-10-02 ENCOUNTER — Encounter: Payer: Self-pay | Admitting: Family Medicine

## 2023-10-02 ENCOUNTER — Other Ambulatory Visit: Payer: Self-pay | Admitting: Family Medicine

## 2023-10-02 DIAGNOSIS — D751 Secondary polycythemia: Secondary | ICD-10-CM

## 2023-10-06 ENCOUNTER — Telehealth: Payer: Self-pay | Admitting: Family Medicine

## 2023-10-06 DIAGNOSIS — L820 Inflamed seborrheic keratosis: Secondary | ICD-10-CM | POA: Diagnosis not present

## 2023-10-06 DIAGNOSIS — L308 Other specified dermatitis: Secondary | ICD-10-CM | POA: Diagnosis not present

## 2023-10-06 DIAGNOSIS — L218 Other seborrheic dermatitis: Secondary | ICD-10-CM | POA: Diagnosis not present

## 2023-10-06 NOTE — Telephone Encounter (Signed)
 Copied from CRM [id number]. Topic: Referral - Prior Authorization Question >> Oct 06, 2023  3:56 PM Luane Rumps D wrote: Reason for CRM: Kylie with pre-service at [facility] calling in regards to upcoming Mr Marlyse Single, stated that patient's insurance requires prior authorization in order for the scan to be covered.  Phone: [phone] ext: [phone]

## 2023-10-06 NOTE — Telephone Encounter (Signed)
 This is concerning the orders for imaging that Dr Hildy Lowers put in on 09/30/23.

## 2023-10-07 ENCOUNTER — Encounter: Payer: Self-pay | Admitting: Family Medicine

## 2023-10-07 DIAGNOSIS — R221 Localized swelling, mass and lump, neck: Secondary | ICD-10-CM

## 2023-10-08 ENCOUNTER — Ambulatory Visit (HOSPITAL_BASED_OUTPATIENT_CLINIC_OR_DEPARTMENT_OTHER)

## 2023-10-08 NOTE — Telephone Encounter (Signed)
 Done

## 2023-10-13 ENCOUNTER — Other Ambulatory Visit: Payer: Self-pay | Admitting: Family Medicine

## 2023-10-13 ENCOUNTER — Encounter: Payer: Self-pay | Admitting: Family Medicine

## 2023-10-13 DIAGNOSIS — M1611 Unilateral primary osteoarthritis, right hip: Secondary | ICD-10-CM | POA: Insufficient documentation

## 2023-10-15 ENCOUNTER — Ambulatory Visit (HOSPITAL_BASED_OUTPATIENT_CLINIC_OR_DEPARTMENT_OTHER)
Admission: RE | Admit: 2023-10-15 | Discharge: 2023-10-15 | Disposition: A | Discharge status: Home / Self Care | Source: Ambulatory Visit | Attending: Family Medicine | Admitting: Family Medicine

## 2023-10-15 DIAGNOSIS — R221 Localized swelling, mass and lump, neck: Secondary | ICD-10-CM | POA: Insufficient documentation

## 2023-10-20 ENCOUNTER — Encounter: Payer: Self-pay | Admitting: Family Medicine

## 2023-10-21 ENCOUNTER — Encounter: Payer: Self-pay | Admitting: Family Medicine

## 2023-10-21 DIAGNOSIS — M48061 Spinal stenosis, lumbar region without neurogenic claudication: Secondary | ICD-10-CM | POA: Diagnosis not present

## 2023-10-22 ENCOUNTER — Ambulatory Visit (HOSPITAL_BASED_OUTPATIENT_CLINIC_OR_DEPARTMENT_OTHER)

## 2023-11-05 DIAGNOSIS — L814 Other melanin hyperpigmentation: Secondary | ICD-10-CM | POA: Diagnosis not present

## 2023-11-05 DIAGNOSIS — L408 Other psoriasis: Secondary | ICD-10-CM | POA: Diagnosis not present

## 2023-11-05 DIAGNOSIS — L821 Other seborrheic keratosis: Secondary | ICD-10-CM | POA: Diagnosis not present

## 2023-11-05 DIAGNOSIS — L718 Other rosacea: Secondary | ICD-10-CM | POA: Diagnosis not present

## 2023-11-06 ENCOUNTER — Other Ambulatory Visit: Payer: Self-pay | Admitting: Family Medicine

## 2023-11-06 DIAGNOSIS — M25551 Pain in right hip: Secondary | ICD-10-CM

## 2023-11-17 ENCOUNTER — Inpatient Hospital Stay

## 2023-11-17 ENCOUNTER — Inpatient Hospital Stay: Attending: Hematology and Oncology | Admitting: Hematology and Oncology

## 2023-11-17 ENCOUNTER — Encounter: Payer: Self-pay | Admitting: Hematology and Oncology

## 2023-11-17 VITALS — BP 143/85 | HR 72 | Temp 98.6°F | Resp 17 | Ht 67.5 in | Wt 223.2 lb

## 2023-11-17 DIAGNOSIS — Z9049 Acquired absence of other specified parts of digestive tract: Secondary | ICD-10-CM | POA: Insufficient documentation

## 2023-11-17 DIAGNOSIS — D751 Secondary polycythemia: Secondary | ICD-10-CM | POA: Diagnosis not present

## 2023-11-17 DIAGNOSIS — Z7982 Long term (current) use of aspirin: Secondary | ICD-10-CM | POA: Diagnosis not present

## 2023-11-17 DIAGNOSIS — Z79899 Other long term (current) drug therapy: Secondary | ICD-10-CM | POA: Insufficient documentation

## 2023-11-17 DIAGNOSIS — Z9089 Acquired absence of other organs: Secondary | ICD-10-CM | POA: Insufficient documentation

## 2023-11-17 DIAGNOSIS — Z8261 Family history of arthritis: Secondary | ICD-10-CM | POA: Diagnosis not present

## 2023-11-17 DIAGNOSIS — Z8249 Family history of ischemic heart disease and other diseases of the circulatory system: Secondary | ICD-10-CM | POA: Diagnosis not present

## 2023-11-17 DIAGNOSIS — Z8759 Personal history of other complications of pregnancy, childbirth and the puerperium: Secondary | ICD-10-CM | POA: Diagnosis not present

## 2023-11-17 DIAGNOSIS — Z87442 Personal history of urinary calculi: Secondary | ICD-10-CM | POA: Insufficient documentation

## 2023-11-17 DIAGNOSIS — Z8349 Family history of other endocrine, nutritional and metabolic diseases: Secondary | ICD-10-CM | POA: Diagnosis not present

## 2023-11-17 DIAGNOSIS — Z800 Family history of malignant neoplasm of digestive organs: Secondary | ICD-10-CM | POA: Insufficient documentation

## 2023-11-17 DIAGNOSIS — I10 Essential (primary) hypertension: Secondary | ICD-10-CM | POA: Insufficient documentation

## 2023-11-17 DIAGNOSIS — F32A Depression, unspecified: Secondary | ICD-10-CM | POA: Diagnosis not present

## 2023-11-17 DIAGNOSIS — Z818 Family history of other mental and behavioral disorders: Secondary | ICD-10-CM | POA: Diagnosis not present

## 2023-11-17 DIAGNOSIS — E66811 Obesity, class 1: Secondary | ICD-10-CM | POA: Insufficient documentation

## 2023-11-17 NOTE — Progress Notes (Signed)
 [name]  Patient Care Team: Catheryn Cluck, MD as PCP - General (Family Medicine) Lajuan Pila, MD as Referring Physician (Specialist)  ASSESSMENT & PLAN Erythrocytosis The most likely cause of her intermittent erythrocytosis is probably due to undiagnosed obstructive sleep apnea She is not symptomatic and is taking aspirin We discussed role of further workup versus lifestyle changes and dietary modification and intermittent blood donation Ultimately, the patient would like the conservative approach She will have labs again repeated with her other healthcare providers in a few more months and she will contact me via MyChart message   Obesity, Class I, BMI 30-34.9 The patient has chronic history of obesity She is highly motivated and has lost a lot of weight over the past 2 years after imposing intermittent fasting, low carbohydrate diet and time restricted feeding However, she started to gain weight again recently She completed the STOP-BANG questionnaire; she is borderline with risk of obstructive sleep apnea Ultimately, she is in agreement with integrating more physical activity and start tracking her sleep to see if she can start improving her ability to lose weight again I encouraged her with her persistent effort to lose weight on her own  Almeda Jacobs, MD  11/17/2023 11:13 AM  The total time spent in the appointment was 55 minutes encounter with patients including review of chart and various tests results, discussions about plan of care and coordination of care plan   All questions were answered. The patient knows to call the clinic with any problems, questions or concerns. No barriers to learning was detected.  Almeda Jacobs, MD 6/9/202511:13 AM   CHIEF COMPLAINTS/PURPOSE OF CONSULTATION:  Erythrocytosis  HISTORY OF PRESENTING ILLNESS:  Jenny Arroyo 63 y.o. female is here because of elevated hemoglobin.  She was found to have abnormal  CBC from routine blood work I have the opportunity to review his CBC dated back to 2014 Her baseline was normal Starting around 2021, she has intermittent erythrocytosis with hemoglobin ranging from 15.2-16.1 The patient has history of significant obesity with maximum weight around 213 pounds 2 years ago She started her weight loss journey with integrating low carbohydrate diet, tracking macros, time restricted feeding with occasional 36 hours intermittent fasting and was able to lose almost 100 pounds However, most recently, she started gaining weight again after resumption of 3 meals per day and higher protein diet  She denies intermittent headaches, shortness of breath on exertion, frequent leg cramps and occasional chest pain.  She never suffer from diagnosis of blood clot.  There is no prior diagnosis of obstructive sleep apnea. The patient denies weight loss or skin itching. She is not a smoker  MEDICAL HISTORY:  Past Medical History:  Diagnosis Date   Arthritis 2015   Having hip pain need to discuss   Cataract 2012   Surgery   Chronic kidney disease    kidney stones    Depression    mild on Bupropion     Hypertension    PONV (postoperative nausea and vomiting)    Thyroid  disease     SURGICAL HISTORY: Past Surgical History:  Procedure Laterality Date   APPENDECTOMY     CESAREAN SECTION     x1   CHOLECYSTECTOMY     ECTOPIC PREGNANCY SURGERY     EYE SURGERY     JOINT REPLACEMENT     KNEE ARTHROSCOPY Right 2015   LITHOTRIPSY     REPLACEMENT TOTAL KNEE Right 04/2018   REPLACEMENT TOTAL KNEE Left  07/11/2022   TONSILLECTOMY      SOCIAL HISTORY: Social History   Socioeconomic History   Marital status: Married    Spouse name: Not on file   Number of children: 2   Years of education: Not on file   Highest education level: Associate degree: academic program  Occupational History   Not on file  Tobacco Use   Smoking status: Never   Smokeless tobacco: Never   Vaping Use   Vaping status: Never Used  Substance and Sexual Activity   Alcohol use: Never   Drug use: Never   Sexual activity: Not Currently  Other Topics Concern   Not on file  Social History Narrative   Not on file   Social Drivers of Health   Financial Resource Strain: Low Risk  (09/30/2023)   Overall Financial Resource Strain (CARDIA)    Difficulty of Paying Living Expenses: Not hard at all  Food Insecurity: No Food Insecurity (11/17/2023)   Hunger Vital Sign    Worried About Running Out of Food in the Last Year: Never true    Ran Out of Food in the Last Year: Never true  Transportation Needs: No Transportation Needs (11/17/2023)   PRAPARE - Administrator, Civil Service (Medical): No    Lack of Transportation (Non-Medical): No  Physical Activity: Insufficiently Active (09/30/2023)   Exercise Vital Sign    Days of Exercise per Week: 2 days    Minutes of Exercise per Session: 20 min  Stress: No Stress Concern Present (09/30/2023)   [employer] of Occupational Health - Occupational Stress Questionnaire    Feeling of Stress : Only a little  Social Connections: Socially Integrated (09/30/2023)   Social Connection and Isolation Panel [NHANES]    Frequency of Communication with Friends and Family: More than three times a week    Frequency of Social Gatherings with Friends and Family: Twice a week    Attends Religious Services: More than 4 times per year    Active Member of [employer] or Organizations: Yes    Attends Engineer, structural: More than 4 times per year    Marital Status: Married  Catering manager Violence: Not At Risk (11/17/2023)   Humiliation, Afraid, Rape, and Kick questionnaire    Fear of Current or Ex-Partner: No    Emotionally Abused: No    Physically Abused: No    Sexually Abused: No    FAMILY HISTORY: Family History  Problem Relation Age of Onset   Hypertension Mother    Alzheimer's disease Mother 29   Arthritis Mother    Depression  Mother    Obesity Mother    Varicose Veins Mother    Cancer Father 40       colon    Hypertension Father    Arthritis Father    Gout Father    Colon cancer Father 42   Pancreatic cancer Father    Obesity Father    Depression Brother    Mental illness Daughter    Alzheimer's disease Maternal Grandfather 80   Depression Sister    Obesity Sister    Colon polyps Neg Hx    Rectal cancer Neg Hx    Stomach cancer Neg Hx    Esophageal cancer Neg Hx     ALLERGIES:  is allergic to rosuvastatin .  MEDICATIONS:  Current Outpatient Medications  Medication Sig Dispense Refill   aspirin 81 MG chewable tablet Chew by mouth daily.     buPROPion  (WELLBUTRIN  XL) 150 MG  24 hr tablet Take 1 tablet (150 mg total) by mouth every morning. 90 tablet 3   Cholecalciferol (VITAMIN D  PO) Take 5,000 Int'l Units by mouth daily.     diazepam  (VALIUM ) 5 MG tablet 1/2-1 pill every 8 hours for dizziness/vertigo 30 tablet 0   Krill Oil 500 MG CAPS Take by mouth.     levothyroxine  (SYNTHROID ) 150 MCG tablet Take 1/2 tablet by mouth on Tuesday and Thursday, and, take 1 tablet by mouth on all other days. Take on an empty stomach with only water for 30 minutes. No antacid, calcium  or magnesium medication for 4 hours, and avoid biotin. 80 tablet 3   meloxicam  (MOBIC ) 15 MG tablet Take 1 tablet by mouth once daily 30 tablet 0   Multiple Vitamin (MULTIVITAMIN) tablet Take 1 tablet by mouth daily.     PSYLLIUM FIBER PO Take by mouth.     triamcinolone  ointment (KENALOG ) 0.1 % Apply topically 2 (two) times daily as needed (rash). 80 g 0   No current facility-administered medications for this visit.    REVIEW OF SYSTEMS:   Constitutional: Denies fevers, chills or abnormal night sweats Eyes: Denies blurriness of vision, double vision or watery eyes Ears, nose, mouth, throat, and face: Denies mucositis or sore throat Respiratory: Denies cough, dyspnea or wheezes Cardiovascular: Denies palpitation, chest discomfort or  lower extremity swelling Gastrointestinal:  Denies nausea, heartburn or change in bowel habits Skin: Denies abnormal skin rashes Lymphatics: Denies new lymphadenopathy or easy bruising Neurological:Denies numbness, tingling or new weaknesses Behavioral/Psych: Mood is stable, no new changes  All other systems were reviewed with the patient and are negative.  PHYSICAL EXAMINATION: ECOG PERFORMANCE STATUS: 0 - Asymptomatic  Vitals:   11/17/23 1006 11/17/23 1007  BP: (!) 148/94 (!) 143/85  Pulse: 72   Resp: 17   Temp: 98.6 F (37 C)   SpO2: 97%    Filed Weights   11/17/23 1006  Weight: 223 lb 4 oz (101.3 kg)    GENERAL:alert, no distress and comfortable SKIN: skin color, texture, turgor are normal, no rashes or significant lesions EYES: normal, conjunctiva are pink and non-injected, sclera clear OROPHARYNX:no exudate, no erythema and lips, buccal mucosa, and tongue normal  NECK: supple, thyroid  normal size, non-tender, without nodularity LYMPH:  no palpable lymphadenopathy in the cervical, axillary or inguinal LUNGS: clear to auscultation and percussion with normal breathing effort HEART: regular rate & rhythm and no murmurs and no lower extremity edema ABDOMEN:abdomen soft, non-tender and normal bowel sounds Musculoskeletal:no cyanosis of digits and no clubbing  PSYCH: alert & oriented x 3 with fluent speech NEURO: no focal motor/sensory deficits  LABORATORY DATA:  I have reviewed the data as listed Recent Results (from the past 2160 hours)  Hepatitis C Antibody     Status: None   Collection Time: 09/30/23 10:35 AM  Result Value Ref Range   Hepatitis C Ab NON-REACTIVE NON-REACTIVE    Comment: . HCV antibody was non-reactive. There is no laboratory  evidence of HCV infection. . In most cases, no further action is required. However, if recent HCV exposure is suspected, a test for HCV RNA (test code 96295) is suggested. . For additional information please refer  to http://education.[facility].com/faq/FAQ22v1 (This link is being provided for informational/ educational purposes only.) .   HIV antibody (with reflex)     Status: None   Collection Time: 09/30/23 10:35 AM  Result Value Ref Range   HIV 1&2 Ab, 4th Generation NON-REACTIVE NON-REACTIVE    Comment: HIV-1  antigen and HIV-1/HIV-2 antibodies were not detected. There is no laboratory evidence of HIV infection. Aaron Aas PLEASE NOTE: This information has been disclosed to you from records whose confidentiality may be protected by state law.  If your state requires such protection, then the state law prohibits you from making any further disclosure of the information without the specific written consent of the person to whom it pertains, or as otherwise permitted by law. A general authorization for the release of medical or other information is NOT sufficient for this purpose. . For additional information please refer to http://education.[facility].com/faq/FAQ106 (This link is being provided for informational/ educational purposes only.) . Aaron Aas The performance of this assay has not been clinically validated in patients less than 64 years old. .   Lipid panel     Status: Abnormal   Collection Time: 09/30/23 10:35 AM  Result Value Ref Range   Cholesterol 200 0 - 200 mg/dL    Comment: ATP III Classification       Desirable:  < 200 mg/dL               Borderline High:  200 - 239 mg/dL          High:  > = 161 mg/dL   Triglycerides 09.6 0.0 - 149.0 mg/dL    Comment: Normal:  <045 mg/dLBorderline High:  150 - 199 mg/dL   HDL 40.98 >11.91 mg/dL   VLDL 47.8 0.0 - 29.5 mg/dL   LDL Cholesterol 621 (H) 0 - 99 mg/dL   Total CHOL/HDL Ratio 3     Comment:                Men          Women1/2 Average Risk     3.4          3.3Average Risk          5.0          4.42X Average Risk          9.6          7.13X Average Risk          15.0          11.0                       NonHDL 134.77      Comment: NOTE:  Non-HDL goal should be 30 mg/dL higher than patient's LDL goal (i.e. LDL goal of < 70 mg/dL, would have non-HDL goal of < 100 mg/dL)  Hemoglobin H0Q     Status: None   Collection Time: 09/30/23 10:35 AM  Result Value Ref Range   Hgb A1c MFr Bld 5.3 4.6 - 6.5 %    Comment: Glycemic Control Guidelines for People with Diabetes:Non Diabetic:  <6%Goal of Therapy: <7%Additional Action Suggested:  >8%   Microalbumin / creatinine urine ratio     Status: None   Collection Time: 09/30/23 10:35 AM  Result Value Ref Range   Microalb, Ur <0.7 mg/dL   Creatinine,U 65.7 mg/dL   Microalb Creat Ratio Unable to calculate 0.0 - 30.0 mg/g    Comment: Unable to Calculate due to Microalbumin Result of <0.7 mg/dL  Urinalysis, Routine w reflex microscopic     Status: Abnormal   Collection Time: 09/30/23 10:35 AM  Result Value Ref Range   Color, Urine YELLOW Yellow;Lt. Yellow;Straw;Dark Yellow;Amber;Green;Red;Brown   APPearance CLEAR Clear;Turbid;Slightly Cloudy;Cloudy   Specific Gravity, Urine <=1.005 (A) 1.000 - 1.030  pH 6.0 5.0 - 8.0   Total Protein, Urine NEGATIVE Negative   Urine Glucose NEGATIVE Negative   Ketones, ur NEGATIVE Negative   Bilirubin Urine NEGATIVE Negative   Hgb urine dipstick NEGATIVE Negative   Urobilinogen, UA 0.2 0.0 - 1.0   Leukocytes,Ua NEGATIVE Negative   Nitrite NEGATIVE Negative   WBC, UA none seen 0-2/hpf   RBC / HPF none seen 0-2/hpf   Squamous Epithelial / HPF Rare(0-4/hpf) Rare(0-4/hpf)  CBC with Differential/Platelet     Status: Abnormal   Collection Time: 09/30/23 10:35 AM  Result Value Ref Range   WBC 6.5 4.0 - 10.5 K/uL   RBC 5.01 3.87 - 5.11 Mil/uL   Hemoglobin 15.7 (H) 12.0 - 15.0 g/dL   HCT 60.4 (H) 54.0 - 98.1 %   MCV 92.6 78.0 - 100.0 fl   MCHC 34.0 30.0 - 36.0 g/dL   RDW 19.1 47.8 - 29.5 %   Platelets 217.0 150.0 - 400.0 K/uL   Neutrophils Relative % 65.4 43.0 - 77.0 %   Lymphocytes Relative 24.4 12.0 - 46.0 %   Monocytes Relative 6.9  3.0 - 12.0 %   Eosinophils Relative 2.5 0.0 - 5.0 %   Basophils Relative 0.8 0.0 - 3.0 %   Neutro Abs 4.3 1.4 - 7.7 K/uL   Lymphs Abs 1.6 0.7 - 4.0 K/uL   Monocytes Absolute 0.4 0.1 - 1.0 K/uL   Eosinophils Absolute 0.2 0.0 - 0.7 K/uL   Basophils Absolute 0.0 0.0 - 0.1 K/uL  Comprehensive metabolic panel with GFR     Status: None   Collection Time: 09/30/23 10:35 AM  Result Value Ref Range   Sodium 140 135 - 145 mEq/L   Potassium 3.6 3.5 - 5.1 mEq/L   Chloride 103 96 - 112 mEq/L   CO2 29 19 - 32 mEq/L   Glucose, Bld 92 70 - 99 mg/dL   BUN 13 6 - 23 mg/dL   Creatinine, Ser 6.21 0.40 - 1.20 mg/dL   Total Bilirubin 0.7 0.2 - 1.2 mg/dL   Alkaline Phosphatase 87 39 - 117 U/L   AST 14 0 - 37 U/L   ALT 19 0 - 35 U/L   Total Protein 6.6 6.0 - 8.3 g/dL   Albumin 4.6 3.5 - 5.2 g/dL   GFR 30.86 >57.84 mL/min    Comment: Calculated using the CKD-EPI Creatinine Equation (2021)   Calcium  9.6 8.4 - 10.5 mg/dL  Thyroid  Panel With TSH     Status: None   Collection Time: 09/30/23 10:35 AM  Result Value Ref Range   T3 Uptake 35 22 - 35 %   T4, Total 9.5 5.1 - 11.9 mcg/dL   Free Thyroxine Index 3.3 1.4 - 3.8   TSH 0.71 0.40 - 4.50 mIU/L  VITAMIN D  25 Hydroxy (Vit-D Deficiency, Fractures)     Status: None   Collection Time: 09/30/23 10:35 AM  Result Value Ref Range   VITD 94.08 30.00 - 100.00 ng/mL  B12 and Folate Panel     Status: Abnormal   Collection Time: 09/30/23 10:35 AM  Result Value Ref Range   Vitamin B-12 1,091 (H) 211 - 911 pg/mL   Folate >25.2 >5.9 ng/mL   No results found.

## 2023-11-17 NOTE — Assessment & Plan Note (Signed)
 The patient has chronic history of obesity She is highly motivated and has lost a lot of weight over the past 2 years after imposing intermittent fasting, low carbohydrate diet and time restricted feeding However, she started to gain weight again recently She completed the STOP-BANG questionnaire; she is borderline with risk of obstructive sleep apnea Ultimately, she is in agreement with integrating more physical activity and start tracking her sleep to see if she can start improving her ability to lose weight again I encouraged her with her persistent effort to lose weight on her own

## 2023-11-17 NOTE — Assessment & Plan Note (Signed)
 The most likely cause of her intermittent erythrocytosis is probably due to undiagnosed obstructive sleep apnea She is not symptomatic and is taking aspirin We discussed role of further workup versus lifestyle changes and dietary modification and intermittent blood donation Ultimately, the patient would like the conservative approach She will have labs again repeated with her other healthcare providers in a few more months and she will contact me via MyChart message

## 2023-11-18 ENCOUNTER — Encounter: Payer: Self-pay | Admitting: Hematology and Oncology

## 2023-11-19 ENCOUNTER — Ambulatory Visit: Admitting: Dietician

## 2023-12-10 DIAGNOSIS — E559 Vitamin D deficiency, unspecified: Secondary | ICD-10-CM | POA: Diagnosis not present

## 2023-12-10 DIAGNOSIS — E039 Hypothyroidism, unspecified: Secondary | ICD-10-CM | POA: Diagnosis not present

## 2023-12-10 DIAGNOSIS — R7301 Impaired fasting glucose: Secondary | ICD-10-CM | POA: Diagnosis not present

## 2023-12-10 DIAGNOSIS — E8889 Other specified metabolic disorders: Secondary | ICD-10-CM | POA: Diagnosis not present

## 2023-12-10 DIAGNOSIS — E538 Deficiency of other specified B group vitamins: Secondary | ICD-10-CM | POA: Diagnosis not present

## 2023-12-22 ENCOUNTER — Encounter: Payer: Self-pay | Admitting: Hematology and Oncology

## 2023-12-24 DIAGNOSIS — E039 Hypothyroidism, unspecified: Secondary | ICD-10-CM | POA: Diagnosis not present

## 2023-12-24 DIAGNOSIS — E559 Vitamin D deficiency, unspecified: Secondary | ICD-10-CM | POA: Diagnosis not present

## 2023-12-24 DIAGNOSIS — E88819 Insulin resistance, unspecified: Secondary | ICD-10-CM | POA: Diagnosis not present

## 2023-12-24 DIAGNOSIS — E66811 Obesity, class 1: Secondary | ICD-10-CM | POA: Diagnosis not present

## 2024-01-05 ENCOUNTER — Encounter: Payer: Self-pay | Admitting: Family Medicine

## 2024-01-09 DIAGNOSIS — E559 Vitamin D deficiency, unspecified: Secondary | ICD-10-CM | POA: Diagnosis not present

## 2024-01-09 DIAGNOSIS — E88819 Insulin resistance, unspecified: Secondary | ICD-10-CM | POA: Diagnosis not present

## 2024-01-09 DIAGNOSIS — F3289 Other specified depressive episodes: Secondary | ICD-10-CM | POA: Diagnosis not present

## 2024-01-09 DIAGNOSIS — E039 Hypothyroidism, unspecified: Secondary | ICD-10-CM | POA: Diagnosis not present

## 2024-02-12 DIAGNOSIS — E88819 Insulin resistance, unspecified: Secondary | ICD-10-CM | POA: Diagnosis not present

## 2024-02-12 DIAGNOSIS — E66811 Obesity, class 1: Secondary | ICD-10-CM | POA: Diagnosis not present

## 2024-02-12 DIAGNOSIS — E039 Hypothyroidism, unspecified: Secondary | ICD-10-CM | POA: Diagnosis not present

## 2024-02-12 DIAGNOSIS — E559 Vitamin D deficiency, unspecified: Secondary | ICD-10-CM | POA: Diagnosis not present

## 2024-03-19 ENCOUNTER — Other Ambulatory Visit: Payer: Self-pay | Admitting: Family Medicine

## 2024-03-19 DIAGNOSIS — M25551 Pain in right hip: Secondary | ICD-10-CM

## 2024-03-29 ENCOUNTER — Other Ambulatory Visit (HOSPITAL_BASED_OUTPATIENT_CLINIC_OR_DEPARTMENT_OTHER): Payer: Self-pay

## 2024-03-29 MED ORDER — FLUZONE 0.5 ML IM SUSY
0.5000 mL | PREFILLED_SYRINGE | Freq: Once | INTRAMUSCULAR | 0 refills | Status: AC
  Filled 2024-03-29: qty 0.5, 1d supply, fill #0

## 2024-04-22 ENCOUNTER — Other Ambulatory Visit: Payer: Self-pay | Admitting: Medical Genetics

## 2024-04-29 ENCOUNTER — Encounter: Payer: Self-pay | Admitting: Family Medicine

## 2024-04-29 ENCOUNTER — Ambulatory Visit: Admitting: Family Medicine

## 2024-04-29 VITALS — BP 132/80 | HR 76 | Temp 99.0°F | Ht 67.0 in | Wt 217.0 lb

## 2024-04-29 DIAGNOSIS — E6609 Other obesity due to excess calories: Secondary | ICD-10-CM

## 2024-04-29 DIAGNOSIS — Z96651 Presence of right artificial knee joint: Secondary | ICD-10-CM

## 2024-04-29 DIAGNOSIS — L219 Seborrheic dermatitis, unspecified: Secondary | ICD-10-CM

## 2024-04-29 DIAGNOSIS — N958 Other specified menopausal and perimenopausal disorders: Secondary | ICD-10-CM

## 2024-04-29 DIAGNOSIS — Z6833 Body mass index (BMI) 33.0-33.9, adult: Secondary | ICD-10-CM

## 2024-04-29 DIAGNOSIS — D751 Secondary polycythemia: Secondary | ICD-10-CM

## 2024-04-29 DIAGNOSIS — G4719 Other hypersomnia: Secondary | ICD-10-CM | POA: Diagnosis not present

## 2024-04-29 DIAGNOSIS — M25551 Pain in right hip: Secondary | ICD-10-CM

## 2024-04-29 DIAGNOSIS — R42 Dizziness and giddiness: Secondary | ICD-10-CM

## 2024-04-29 DIAGNOSIS — Z006 Encounter for examination for normal comparison and control in clinical research program: Secondary | ICD-10-CM

## 2024-04-29 DIAGNOSIS — M2550 Pain in unspecified joint: Secondary | ICD-10-CM

## 2024-04-29 DIAGNOSIS — E66811 Obesity, class 1: Secondary | ICD-10-CM

## 2024-05-03 DIAGNOSIS — H04123 Dry eye syndrome of bilateral lacrimal glands: Secondary | ICD-10-CM | POA: Diagnosis not present

## 2024-05-03 DIAGNOSIS — H18523 Epithelial (juvenile) corneal dystrophy, bilateral: Secondary | ICD-10-CM | POA: Diagnosis not present

## 2024-05-03 DIAGNOSIS — H029 Unspecified disorder of eyelid: Secondary | ICD-10-CM | POA: Diagnosis not present

## 2024-05-03 DIAGNOSIS — H26493 Other secondary cataract, bilateral: Secondary | ICD-10-CM | POA: Diagnosis not present

## 2024-05-03 MED ORDER — MELOXICAM 15 MG PO TABS: 15.0000 mg | ORAL_TABLET | Freq: Every day | ORAL | 0 refills | Status: AC

## 2024-05-03 MED ORDER — DIAZEPAM 5 MG PO TABS: ORAL_TABLET | ORAL | 0 refills | Status: AC

## 2024-05-03 MED ORDER — ESTRADIOL 0.01 % VA CREA
1.0000 | TOPICAL_CREAM | Freq: Every day | VAGINAL | 12 refills | Status: AC
Start: 2024-05-03 — End: ?

## 2024-05-03 NOTE — Progress Notes (Unsigned)
 Assessment & Plan:   1. Research study patient - Transport Planner Screen - Blood; Future - [employer] Molecular Screen - Blood  2. Vertigo - diazepam  (VALIUM ) 5 MG tablet; 1/2-1 pill every 8 hours for dizziness/vertigo  Dispense: 30 tablet; Refill: 0  3. Genitourinary syndrome of menopause - estradiol  (ESTRACE ) 0.01 % CREA vaginal cream; Place 1 Applicatorful vaginally at bedtime.  Dispense: 42.5 g; Refill: 12  4. Arthralgia of multiple joints - meloxicam  (MOBIC ) 15 MG tablet; Take 1 tablet (15 mg total) by mouth daily.  Dispense: 90 tablet; Refill: 0  5. Polycythemia - Ambulatory referral to Sleep Studies  6. Excessive daytime sleepiness (Primary) Comments: Doing well re: exercise, eating well, stress reduction, etc. Lost significant weight. Husband says she still snores. Also with elevated Hgb. Concern for OSA. - Ambulatory referral to Sleep Studies  7. Seborrheic dermatitis - clobetasol  (TEMOVATE ) 0.05 % external solution; Apply topically 2 (two) times daily.  8. History of total right knee replacement - amoxicillin  (AMOXIL ) 500 MG capsule; TAKE FOUR CAPSULES BY MOUTH ONE HOUR BEFORE APPOINTMENT  9. Class 1 obesity due to excess calories with serious comorbidity and body mass index (BMI) of 33.0 to 33.9 in adult  Hackberry, DO, MS, FAAFP, Dipl. [facility] [address] [place], [address] Dept: [phone] Dept Fax: [phone]  Subjective:   Past medical history, surgical history, allergies, family history, immunizations andmedications were updated in the EMR today and reviewed under the history and medication portions of their EMR. Review of Systems: Negative, with the exception of above mentioned in HPI.  Current Outpatient Medications:    amoxicillin  (AMOXIL ) 500 MG capsule, TAKE FOUR CAPSULES BY MOUTH ONE HOUR BEFORE APPOINTMENT, Disp: , Rfl:    aspirin 81 MG chewable tablet, Chew by mouth daily.,  Disp: , Rfl:    buPROPion  (WELLBUTRIN  XL) 150 MG 24 hr tablet, Take 1 tablet (150 mg total) by mouth every morning., Disp: 90 tablet, Rfl: 3   Cholecalciferol (VITAMIN D  PO), Take 5,000 Int'l Units by mouth daily., Disp: , Rfl:    clobetasol  (TEMOVATE ) 0.05 % external solution, Apply topically 2 (two) times daily., Disp: , Rfl:    estradiol  (ESTRACE ) 0.01 % CREA vaginal cream, Place 1 Applicatorful vaginally at bedtime., Disp: 42.5 g, Rfl: 12   Krill Oil 500 MG CAPS, Take by mouth., Disp: , Rfl:    levothyroxine  (SYNTHROID ) 150 MCG tablet, Take 1/2 tablet by mouth on Tuesday and Thursday, and, take 1 tablet by mouth on all other days. Take on an empty stomach with only water for 30 minutes. No antacid, calcium  or magnesium medication for 4 hours, and avoid biotin., Disp: 80 tablet, Rfl: 3   PSYLLIUM FIBER PO, Take by mouth., Disp: , Rfl:    triamcinolone  ointment (KENALOG ) 0.1 %, Apply topically 2 (two) times daily as needed (rash)., Disp: 80 g, Rfl: 0   diazepam  (VALIUM ) 5 MG tablet, 1/2-1 pill every 8 hours for dizziness/vertigo, Disp: 30 tablet, Rfl: 0   meloxicam  (MOBIC ) 15 MG tablet, Take 1 tablet (15 mg total) by mouth daily., Disp: 90 tablet, Rfl: 0   Multiple Vitamin (MULTIVITAMIN) tablet, Take 1 tablet by mouth daily. (Patient not taking: Reported on 04/29/2024), Disp: , Rfl:    Objective:   BP 132/80 (BP Location: Left Arm, Cuff Size: Normal)   Pulse 76   Temp 99 F (37.2 C) (Oral)   Ht 5' 7 (1.702 m)   Wt 217 lb (98.4 kg)  [name] 10/24/2017   SpO2 96%   BMI 33.99 kg/m   Wt Readings from Last 3 Encounters:  04/29/24 217 lb (98.4 kg)  11/17/23 223 lb 4 oz (101.3 kg)  09/30/23 218 lb 9.6 oz (99.2 kg)   Physical Exam Constitutional:      General: She is not in acute distress.    Appearance: She is well-developed. She is obese.  HENT:     Head: Normocephalic and atraumatic.  Eyes:     Conjunctiva/sclera: Conjunctivae normal.  Cardiovascular:     Rate and Rhythm: Normal  rate and regular rhythm.     Heart sounds: Normal heart sounds.  Pulmonary:     Effort: Pulmonary effort is normal.     Breath sounds: Normal breath sounds.  Musculoskeletal:     Cervical back: Normal range of motion and neck supple.  Neurological:     General: No focal deficit present.     Mental Status: She is alert and oriented to person, place, and time.  Psychiatric:        Mood and Affect: Mood normal.        Behavior: Behavior normal.    Lab Results  Component Value Date   CREATININE 0.62 09/30/2023   BUN 13 09/30/2023   NA 140 09/30/2023   K 3.6 09/30/2023   CL 103 09/30/2023   CO2 29 09/30/2023   Lab Results  Component Value Date   ALT 19 09/30/2023   AST 14 09/30/2023   ALKPHOS 87 09/30/2023   BILITOT 0.7 09/30/2023   Lab Results  Component Value Date   HGBA1C 5.3 09/30/2023   HGBA1C 5.3 03/04/2023   HGBA1C 5.4 09/05/2022   HGBA1C 5.3 03/12/2022   HGBA1C 5.7 (H) 08/09/2021   No results found for: INSULIN  Lab Results  Component Value Date   TSH 0.71 09/30/2023   Lab Results  Component Value Date   CHOL 200 09/30/2023   HDL 65.30 09/30/2023   LDLCALC 116 (H) 09/30/2023   TRIG 94.0 09/30/2023   CHOLHDL 3 09/30/2023   Lab Results  Component Value Date   VD25OH 94.08 09/30/2023   VD25OH 85 09/05/2022   VD25OH 81 08/09/2021   Lab Results  Component Value Date   WBC 6.5 09/30/2023   HGB 15.7 (H) 09/30/2023   HCT 46.3 (H) 09/30/2023   MCV 92.6 09/30/2023   PLT 217.0 09/30/2023   Lab Results  Component Value Date   IRON 125 08/09/2020   TIBC 298 08/09/2020   FERRITIN 11 02/21/2015

## 2024-05-06 DIAGNOSIS — R42 Dizziness and giddiness: Secondary | ICD-10-CM | POA: Insufficient documentation

## 2024-05-06 DIAGNOSIS — G4719 Other hypersomnia: Secondary | ICD-10-CM | POA: Insufficient documentation

## 2024-05-06 DIAGNOSIS — Z006 Encounter for examination for normal comparison and control in clinical research program: Secondary | ICD-10-CM | POA: Insufficient documentation

## 2024-05-06 DIAGNOSIS — N958 Other specified menopausal and perimenopausal disorders: Secondary | ICD-10-CM | POA: Insufficient documentation

## 2024-05-06 DIAGNOSIS — M2550 Pain in unspecified joint: Secondary | ICD-10-CM | POA: Insufficient documentation

## 2024-05-12 LAB — GENECONNECT MOLECULAR SCREEN: Genetic Analysis Overall Interpretation: NEGATIVE

## 2024-06-23 NOTE — Telephone Encounter (Signed)
 Forwarding to our referral team. Pt was calling about a referral for sleep. It seems a letter regarding were pt was referred to was in the communication tab of the referral and the location information was relayed to her when she inquired about it last week. However, when she called them, she was told that they never received a referral for her.
